# Patient Record
Sex: Female | Born: 1955
Health system: Southern US, Community
[De-identification: ages and names within clinical notes are randomized; demographics above are authoritative.]

## PROBLEM LIST (undated history)

## (undated) DIAGNOSIS — J449 Chronic obstructive pulmonary disease, unspecified: Secondary | ICD-10-CM

## (undated) HISTORY — PX: ABDOMINAL HYSTERECTOMY: SHX81

## (undated) HISTORY — PX: COLON SURGERY: SHX602

## (undated) HISTORY — PX: ANKLE ARTHROTOMY: SUR101

## (undated) HISTORY — PX: ERCP: SHX60

---

## 1997-10-26 ENCOUNTER — Ambulatory Visit (HOSPITAL_COMMUNITY): Admission: RE | Admit: 1997-10-26 | Discharge: 1997-10-26 | Payer: Self-pay | Admitting: Gastroenterology

## 1998-02-14 ENCOUNTER — Ambulatory Visit (HOSPITAL_COMMUNITY): Admission: RE | Admit: 1998-02-14 | Discharge: 1998-02-14 | Payer: Self-pay | Admitting: Orthopedic Surgery

## 1998-02-14 ENCOUNTER — Encounter: Payer: Self-pay | Admitting: Orthopedic Surgery

## 1998-02-18 ENCOUNTER — Emergency Department (HOSPITAL_COMMUNITY): Admission: EM | Admit: 1998-02-18 | Discharge: 1998-02-19 | Payer: Self-pay | Admitting: Emergency Medicine

## 1998-04-17 ENCOUNTER — Encounter: Payer: Self-pay | Admitting: Emergency Medicine

## 1998-04-17 ENCOUNTER — Emergency Department (HOSPITAL_COMMUNITY): Admission: EM | Admit: 1998-04-17 | Discharge: 1998-04-17 | Payer: Self-pay | Admitting: Emergency Medicine

## 1998-05-12 ENCOUNTER — Encounter: Payer: Self-pay | Admitting: Emergency Medicine

## 1998-05-12 ENCOUNTER — Emergency Department (HOSPITAL_COMMUNITY): Admission: EM | Admit: 1998-05-12 | Discharge: 1998-05-12 | Payer: Self-pay | Admitting: Emergency Medicine

## 1998-09-03 ENCOUNTER — Other Ambulatory Visit: Admission: RE | Admit: 1998-09-03 | Discharge: 1998-09-03 | Payer: Self-pay | Admitting: Family Medicine

## 1998-10-24 ENCOUNTER — Encounter: Payer: Self-pay | Admitting: *Deleted

## 1998-10-24 ENCOUNTER — Emergency Department (HOSPITAL_COMMUNITY): Admission: EM | Admit: 1998-10-24 | Discharge: 1998-10-24 | Payer: Self-pay | Admitting: *Deleted

## 1998-12-13 ENCOUNTER — Emergency Department (HOSPITAL_COMMUNITY): Admission: EM | Admit: 1998-12-13 | Discharge: 1998-12-14 | Payer: Self-pay | Admitting: Emergency Medicine

## 1998-12-13 ENCOUNTER — Encounter: Payer: Self-pay | Admitting: Emergency Medicine

## 1998-12-15 ENCOUNTER — Encounter: Payer: Self-pay | Admitting: Emergency Medicine

## 1998-12-15 ENCOUNTER — Emergency Department (HOSPITAL_COMMUNITY): Admission: EM | Admit: 1998-12-15 | Discharge: 1998-12-15 | Payer: Self-pay | Admitting: Emergency Medicine

## 1999-06-23 ENCOUNTER — Ambulatory Visit (HOSPITAL_COMMUNITY): Admission: RE | Admit: 1999-06-23 | Discharge: 1999-06-23 | Payer: Self-pay | Admitting: *Deleted

## 1999-11-12 ENCOUNTER — Encounter: Payer: Self-pay | Admitting: Critical Care Medicine

## 1999-11-12 ENCOUNTER — Ambulatory Visit (HOSPITAL_COMMUNITY): Admission: RE | Admit: 1999-11-12 | Discharge: 1999-11-12 | Payer: Self-pay | Admitting: Critical Care Medicine

## 2000-03-10 ENCOUNTER — Observation Stay (HOSPITAL_COMMUNITY): Admission: EM | Admit: 2000-03-10 | Discharge: 2000-03-12 | Payer: Self-pay | Admitting: Emergency Medicine

## 2000-03-10 ENCOUNTER — Encounter: Payer: Self-pay | Admitting: Internal Medicine

## 2000-04-07 ENCOUNTER — Ambulatory Visit (HOSPITAL_BASED_OUTPATIENT_CLINIC_OR_DEPARTMENT_OTHER): Admission: RE | Admit: 2000-04-07 | Discharge: 2000-04-07 | Payer: Self-pay | Admitting: Family Medicine

## 2000-09-17 ENCOUNTER — Encounter: Payer: Self-pay | Admitting: Family Medicine

## 2000-09-17 ENCOUNTER — Encounter: Admission: RE | Admit: 2000-09-17 | Discharge: 2000-09-17 | Payer: Self-pay | Admitting: Family Medicine

## 2000-11-02 ENCOUNTER — Encounter: Payer: Self-pay | Admitting: Emergency Medicine

## 2000-11-02 ENCOUNTER — Emergency Department (HOSPITAL_COMMUNITY): Admission: EM | Admit: 2000-11-02 | Discharge: 2000-11-03 | Payer: Self-pay | Admitting: Emergency Medicine

## 2001-10-06 ENCOUNTER — Emergency Department (HOSPITAL_COMMUNITY): Admission: EM | Admit: 2001-10-06 | Discharge: 2001-10-07 | Payer: Self-pay | Admitting: Emergency Medicine

## 2001-10-06 ENCOUNTER — Encounter: Payer: Self-pay | Admitting: Emergency Medicine

## 2002-05-22 ENCOUNTER — Encounter: Payer: Self-pay | Admitting: Family Medicine

## 2002-05-22 ENCOUNTER — Encounter: Admission: RE | Admit: 2002-05-22 | Discharge: 2002-05-22 | Payer: Self-pay | Admitting: Family Medicine

## 2002-07-31 ENCOUNTER — Encounter: Admission: RE | Admit: 2002-07-31 | Discharge: 2002-07-31 | Payer: Self-pay | Admitting: Orthopedic Surgery

## 2002-07-31 ENCOUNTER — Encounter: Payer: Self-pay | Admitting: Orthopedic Surgery

## 2002-08-31 ENCOUNTER — Other Ambulatory Visit: Admission: RE | Admit: 2002-08-31 | Discharge: 2002-08-31 | Payer: Self-pay | Admitting: *Deleted

## 2002-09-22 ENCOUNTER — Emergency Department (HOSPITAL_COMMUNITY): Admission: EM | Admit: 2002-09-22 | Discharge: 2002-09-22 | Payer: Self-pay

## 2002-11-03 ENCOUNTER — Observation Stay (HOSPITAL_COMMUNITY): Admission: RE | Admit: 2002-11-03 | Discharge: 2002-11-04 | Payer: Self-pay | Admitting: Obstetrics and Gynecology

## 2003-03-05 ENCOUNTER — Ambulatory Visit (HOSPITAL_COMMUNITY): Admission: RE | Admit: 2003-03-05 | Discharge: 2003-03-05 | Payer: Self-pay | Admitting: Gastroenterology

## 2003-05-01 ENCOUNTER — Ambulatory Visit (HOSPITAL_BASED_OUTPATIENT_CLINIC_OR_DEPARTMENT_OTHER): Admission: RE | Admit: 2003-05-01 | Discharge: 2003-05-01 | Payer: Self-pay | Admitting: Family Medicine

## 2004-01-01 ENCOUNTER — Ambulatory Visit: Payer: Self-pay | Admitting: Family Medicine

## 2004-01-03 ENCOUNTER — Ambulatory Visit: Payer: Self-pay | Admitting: Family Medicine

## 2004-03-17 ENCOUNTER — Emergency Department: Payer: Self-pay | Admitting: Emergency Medicine

## 2004-03-18 ENCOUNTER — Ambulatory Visit: Payer: Self-pay | Admitting: Family Medicine

## 2004-05-03 ENCOUNTER — Emergency Department (HOSPITAL_COMMUNITY): Admission: EM | Admit: 2004-05-03 | Discharge: 2004-05-04 | Payer: Self-pay | Admitting: Emergency Medicine

## 2004-05-14 ENCOUNTER — Ambulatory Visit: Payer: Self-pay | Admitting: Family Medicine

## 2004-05-15 ENCOUNTER — Ambulatory Visit: Payer: Self-pay | Admitting: Family Medicine

## 2004-05-23 ENCOUNTER — Ambulatory Visit: Payer: Self-pay | Admitting: Internal Medicine

## 2004-05-26 ENCOUNTER — Ambulatory Visit: Payer: Self-pay | Admitting: *Deleted

## 2004-06-23 ENCOUNTER — Ambulatory Visit: Payer: Self-pay | Admitting: Internal Medicine

## 2004-10-23 ENCOUNTER — Ambulatory Visit: Payer: Self-pay | Admitting: Family Medicine

## 2004-10-31 ENCOUNTER — Ambulatory Visit: Payer: Self-pay | Admitting: Internal Medicine

## 2005-07-09 ENCOUNTER — Ambulatory Visit: Payer: Self-pay | Admitting: Internal Medicine

## 2005-08-13 ENCOUNTER — Ambulatory Visit (HOSPITAL_COMMUNITY): Admission: RE | Admit: 2005-08-13 | Discharge: 2005-08-13 | Payer: Self-pay | Admitting: Family Medicine

## 2005-08-13 ENCOUNTER — Ambulatory Visit: Payer: Self-pay | Admitting: Internal Medicine

## 2005-08-20 ENCOUNTER — Ambulatory Visit (HOSPITAL_COMMUNITY): Admission: RE | Admit: 2005-08-20 | Discharge: 2005-08-20 | Payer: Self-pay | Admitting: Internal Medicine

## 2005-08-20 ENCOUNTER — Ambulatory Visit: Payer: Self-pay | Admitting: Internal Medicine

## 2005-08-24 ENCOUNTER — Ambulatory Visit: Payer: Self-pay | Admitting: Internal Medicine

## 2005-09-03 ENCOUNTER — Ambulatory Visit: Payer: Self-pay | Admitting: Internal Medicine

## 2005-10-27 ENCOUNTER — Ambulatory Visit: Payer: Self-pay | Admitting: Internal Medicine

## 2006-07-01 DIAGNOSIS — F3160 Bipolar disorder, current episode mixed, unspecified: Secondary | ICD-10-CM

## 2006-07-01 DIAGNOSIS — G43809 Other migraine, not intractable, without status migrainosus: Secondary | ICD-10-CM

## 2006-07-01 DIAGNOSIS — F17200 Nicotine dependence, unspecified, uncomplicated: Secondary | ICD-10-CM | POA: Insufficient documentation

## 2006-07-01 DIAGNOSIS — I10 Essential (primary) hypertension: Secondary | ICD-10-CM | POA: Insufficient documentation

## 2006-07-01 DIAGNOSIS — IMO0001 Reserved for inherently not codable concepts without codable children: Secondary | ICD-10-CM

## 2006-07-01 DIAGNOSIS — J45909 Unspecified asthma, uncomplicated: Secondary | ICD-10-CM | POA: Insufficient documentation

## 2006-07-01 HISTORY — DX: Unspecified asthma, uncomplicated: J45.909

## 2006-07-01 HISTORY — DX: Essential (primary) hypertension: I10

## 2006-07-01 HISTORY — DX: Bipolar disorder, current episode mixed, unspecified: F31.60

## 2006-07-01 HISTORY — DX: Reserved for inherently not codable concepts without codable children: IMO0001

## 2006-07-01 HISTORY — DX: Other migraine, not intractable, without status migrainosus: G43.809

## 2008-01-06 HISTORY — PX: ABDOMINAL SURGERY: SHX537

## 2010-01-05 HISTORY — PX: HERNIA REPAIR: SHX51

## 2010-01-05 HISTORY — PX: ABDOMINAL SURGERY: SHX537

## 2011-04-06 HISTORY — PX: ABDOMINAL SURGERY: SHX537

## 2011-10-12 DIAGNOSIS — R110 Nausea: Secondary | ICD-10-CM | POA: Insufficient documentation

## 2011-10-12 HISTORY — DX: Nausea: R11.0

## 2011-12-01 DIAGNOSIS — I447 Left bundle-branch block, unspecified: Secondary | ICD-10-CM | POA: Insufficient documentation

## 2011-12-01 DIAGNOSIS — K219 Gastro-esophageal reflux disease without esophagitis: Secondary | ICD-10-CM

## 2011-12-01 HISTORY — DX: Left bundle-branch block, unspecified: I44.7

## 2011-12-01 HISTORY — DX: Gastro-esophageal reflux disease without esophagitis: K21.9

## 2011-12-14 DIAGNOSIS — F4310 Post-traumatic stress disorder, unspecified: Secondary | ICD-10-CM | POA: Insufficient documentation

## 2011-12-14 HISTORY — DX: Post-traumatic stress disorder, unspecified: F43.10

## 2012-03-03 DIAGNOSIS — L089 Local infection of the skin and subcutaneous tissue, unspecified: Secondary | ICD-10-CM | POA: Insufficient documentation

## 2012-03-03 HISTORY — DX: Local infection of the skin and subcutaneous tissue, unspecified: L08.9

## 2012-07-26 DIAGNOSIS — F1190 Opioid use, unspecified, uncomplicated: Secondary | ICD-10-CM

## 2012-07-26 DIAGNOSIS — G894 Chronic pain syndrome: Secondary | ICD-10-CM | POA: Insufficient documentation

## 2012-07-26 HISTORY — DX: Opioid use, unspecified, uncomplicated: F11.90

## 2012-07-26 HISTORY — DX: Chronic pain syndrome: G89.4

## 2012-07-28 DIAGNOSIS — F319 Bipolar disorder, unspecified: Secondary | ICD-10-CM

## 2012-07-28 HISTORY — DX: Bipolar disorder, unspecified: F31.9

## 2012-10-24 DIAGNOSIS — R52 Pain, unspecified: Secondary | ICD-10-CM | POA: Insufficient documentation

## 2012-10-24 HISTORY — DX: Pain, unspecified: R52

## 2013-04-26 DIAGNOSIS — I671 Cerebral aneurysm, nonruptured: Secondary | ICD-10-CM

## 2013-04-26 DIAGNOSIS — Z8679 Personal history of other diseases of the circulatory system: Secondary | ICD-10-CM

## 2013-04-26 HISTORY — DX: Cerebral aneurysm, nonruptured: I67.1

## 2013-04-26 HISTORY — DX: Personal history of other diseases of the circulatory system: Z86.79

## 2013-06-29 DIAGNOSIS — W19XXXA Unspecified fall, initial encounter: Secondary | ICD-10-CM | POA: Insufficient documentation

## 2013-08-15 DIAGNOSIS — R609 Edema, unspecified: Secondary | ICD-10-CM

## 2013-08-15 DIAGNOSIS — R600 Localized edema: Secondary | ICD-10-CM

## 2013-08-15 HISTORY — DX: Localized edema: R60.0

## 2013-08-15 HISTORY — DX: Edema, unspecified: R60.9

## 2013-12-08 DIAGNOSIS — K439 Ventral hernia without obstruction or gangrene: Secondary | ICD-10-CM

## 2013-12-08 DIAGNOSIS — K861 Other chronic pancreatitis: Secondary | ICD-10-CM

## 2013-12-08 HISTORY — DX: Ventral hernia without obstruction or gangrene: K43.9

## 2013-12-08 HISTORY — DX: Other chronic pancreatitis: K86.1

## 2015-11-29 DIAGNOSIS — Z6841 Body Mass Index (BMI) 40.0 and over, adult: Secondary | ICD-10-CM | POA: Insufficient documentation

## 2015-11-29 HISTORY — DX: Morbid (severe) obesity due to excess calories: E66.01

## 2015-11-29 HISTORY — DX: Body Mass Index (BMI) 40.0 and over, adult: Z684

## 2017-03-18 DIAGNOSIS — Z9989 Dependence on other enabling machines and devices: Secondary | ICD-10-CM

## 2017-03-18 DIAGNOSIS — G4726 Circadian rhythm sleep disorder, shift work type: Secondary | ICD-10-CM

## 2017-03-18 DIAGNOSIS — G4733 Obstructive sleep apnea (adult) (pediatric): Secondary | ICD-10-CM

## 2017-03-18 HISTORY — DX: Dependence on other enabling machines and devices: Z99.89

## 2017-03-18 HISTORY — DX: Circadian rhythm sleep disorder, shift work type: G47.26

## 2017-03-18 HISTORY — DX: Obstructive sleep apnea (adult) (pediatric): G47.33

## 2017-06-02 DIAGNOSIS — E1142 Type 2 diabetes mellitus with diabetic polyneuropathy: Secondary | ICD-10-CM

## 2017-06-02 DIAGNOSIS — I509 Heart failure, unspecified: Secondary | ICD-10-CM

## 2017-06-02 DIAGNOSIS — N183 Chronic kidney disease, stage 3 unspecified: Secondary | ICD-10-CM | POA: Insufficient documentation

## 2017-06-02 HISTORY — DX: Type 2 diabetes mellitus with diabetic polyneuropathy: E11.42

## 2017-06-02 HISTORY — DX: Heart failure, unspecified: I50.9

## 2017-06-02 HISTORY — DX: Chronic kidney disease, stage 3 unspecified: N18.30

## 2017-08-25 DIAGNOSIS — E119 Type 2 diabetes mellitus without complications: Secondary | ICD-10-CM | POA: Diagnosis not present

## 2017-09-01 DIAGNOSIS — Z794 Long term (current) use of insulin: Secondary | ICD-10-CM | POA: Diagnosis not present

## 2017-09-01 DIAGNOSIS — E782 Mixed hyperlipidemia: Secondary | ICD-10-CM | POA: Diagnosis not present

## 2017-09-01 DIAGNOSIS — Z760 Encounter for issue of repeat prescription: Secondary | ICD-10-CM | POA: Diagnosis not present

## 2017-09-01 DIAGNOSIS — N183 Chronic kidney disease, stage 3 (moderate): Secondary | ICD-10-CM | POA: Diagnosis not present

## 2017-09-01 DIAGNOSIS — E1142 Type 2 diabetes mellitus with diabetic polyneuropathy: Secondary | ICD-10-CM | POA: Diagnosis not present

## 2017-09-01 DIAGNOSIS — I509 Heart failure, unspecified: Secondary | ICD-10-CM | POA: Diagnosis not present

## 2017-10-02 DIAGNOSIS — I509 Heart failure, unspecified: Secondary | ICD-10-CM | POA: Diagnosis not present

## 2017-10-15 DIAGNOSIS — E119 Type 2 diabetes mellitus without complications: Secondary | ICD-10-CM | POA: Diagnosis not present

## 2017-10-15 DIAGNOSIS — Z23 Encounter for immunization: Secondary | ICD-10-CM | POA: Diagnosis not present

## 2017-10-22 DIAGNOSIS — R109 Unspecified abdominal pain: Secondary | ICD-10-CM | POA: Diagnosis not present

## 2017-10-22 DIAGNOSIS — G8929 Other chronic pain: Secondary | ICD-10-CM | POA: Diagnosis not present

## 2017-11-01 DIAGNOSIS — I509 Heart failure, unspecified: Secondary | ICD-10-CM | POA: Diagnosis not present

## 2017-12-02 DIAGNOSIS — I509 Heart failure, unspecified: Secondary | ICD-10-CM | POA: Diagnosis not present

## 2017-12-03 DIAGNOSIS — E119 Type 2 diabetes mellitus without complications: Secondary | ICD-10-CM | POA: Diagnosis not present

## 2017-12-21 DIAGNOSIS — F419 Anxiety disorder, unspecified: Secondary | ICD-10-CM

## 2017-12-21 DIAGNOSIS — K5904 Chronic idiopathic constipation: Secondary | ICD-10-CM

## 2017-12-21 DIAGNOSIS — I509 Heart failure, unspecified: Secondary | ICD-10-CM | POA: Diagnosis not present

## 2017-12-21 DIAGNOSIS — E7841 Elevated Lipoprotein(a): Secondary | ICD-10-CM | POA: Diagnosis not present

## 2017-12-21 DIAGNOSIS — Z794 Long term (current) use of insulin: Secondary | ICD-10-CM | POA: Diagnosis not present

## 2017-12-21 DIAGNOSIS — K219 Gastro-esophageal reflux disease without esophagitis: Secondary | ICD-10-CM | POA: Diagnosis not present

## 2017-12-21 DIAGNOSIS — F5101 Primary insomnia: Secondary | ICD-10-CM

## 2017-12-21 DIAGNOSIS — Z1239 Encounter for other screening for malignant neoplasm of breast: Secondary | ICD-10-CM | POA: Insufficient documentation

## 2017-12-21 DIAGNOSIS — E1142 Type 2 diabetes mellitus with diabetic polyneuropathy: Secondary | ICD-10-CM | POA: Diagnosis not present

## 2017-12-21 DIAGNOSIS — I10 Essential (primary) hypertension: Secondary | ICD-10-CM | POA: Diagnosis not present

## 2017-12-21 HISTORY — DX: Anxiety disorder, unspecified: F41.9

## 2017-12-21 HISTORY — DX: Primary insomnia: F51.01

## 2017-12-21 HISTORY — DX: Chronic idiopathic constipation: K59.04

## 2017-12-21 HISTORY — DX: Elevated lipoprotein(a): E78.41

## 2017-12-24 DIAGNOSIS — M25561 Pain in right knee: Secondary | ICD-10-CM | POA: Diagnosis not present

## 2018-01-01 DIAGNOSIS — I509 Heart failure, unspecified: Secondary | ICD-10-CM | POA: Diagnosis not present

## 2018-01-11 DIAGNOSIS — Z1239 Encounter for other screening for malignant neoplasm of breast: Secondary | ICD-10-CM | POA: Diagnosis not present

## 2018-01-11 DIAGNOSIS — Z87891 Personal history of nicotine dependence: Secondary | ICD-10-CM | POA: Diagnosis not present

## 2018-01-11 DIAGNOSIS — Z803 Family history of malignant neoplasm of breast: Secondary | ICD-10-CM | POA: Diagnosis not present

## 2018-01-11 DIAGNOSIS — Z1231 Encounter for screening mammogram for malignant neoplasm of breast: Secondary | ICD-10-CM | POA: Diagnosis not present

## 2018-01-11 DIAGNOSIS — Z5320 Procedure and treatment not carried out because of patient's decision for unspecified reasons: Secondary | ICD-10-CM | POA: Diagnosis not present

## 2018-01-17 DIAGNOSIS — R928 Other abnormal and inconclusive findings on diagnostic imaging of breast: Secondary | ICD-10-CM | POA: Diagnosis not present

## 2018-01-21 DIAGNOSIS — Z1212 Encounter for screening for malignant neoplasm of rectum: Secondary | ICD-10-CM | POA: Diagnosis not present

## 2018-01-21 DIAGNOSIS — Z1211 Encounter for screening for malignant neoplasm of colon: Secondary | ICD-10-CM | POA: Diagnosis not present

## 2018-01-26 DIAGNOSIS — F330 Major depressive disorder, recurrent, mild: Secondary | ICD-10-CM | POA: Diagnosis not present

## 2018-01-26 DIAGNOSIS — I872 Venous insufficiency (chronic) (peripheral): Secondary | ICD-10-CM | POA: Insufficient documentation

## 2018-01-26 DIAGNOSIS — F4312 Post-traumatic stress disorder, chronic: Secondary | ICD-10-CM | POA: Diagnosis not present

## 2018-01-26 DIAGNOSIS — K529 Noninfective gastroenteritis and colitis, unspecified: Secondary | ICD-10-CM | POA: Insufficient documentation

## 2018-01-26 DIAGNOSIS — M1990 Unspecified osteoarthritis, unspecified site: Secondary | ICD-10-CM | POA: Insufficient documentation

## 2018-01-26 DIAGNOSIS — I5022 Chronic systolic (congestive) heart failure: Secondary | ICD-10-CM | POA: Insufficient documentation

## 2018-01-26 DIAGNOSIS — J42 Unspecified chronic bronchitis: Secondary | ICD-10-CM | POA: Insufficient documentation

## 2018-01-26 HISTORY — DX: Unspecified chronic bronchitis: J42

## 2018-01-26 HISTORY — DX: Chronic systolic (congestive) heart failure: I50.22

## 2018-01-26 HISTORY — DX: Noninfective gastroenteritis and colitis, unspecified: K52.9

## 2018-01-26 HISTORY — DX: Unspecified osteoarthritis, unspecified site: M19.90

## 2018-01-26 HISTORY — DX: Venous insufficiency (chronic) (peripheral): I87.2

## 2018-02-01 DIAGNOSIS — I509 Heart failure, unspecified: Secondary | ICD-10-CM | POA: Diagnosis not present

## 2018-02-04 DIAGNOSIS — E1151 Type 2 diabetes mellitus with diabetic peripheral angiopathy without gangrene: Secondary | ICD-10-CM | POA: Diagnosis not present

## 2018-02-04 DIAGNOSIS — Z87891 Personal history of nicotine dependence: Secondary | ICD-10-CM | POA: Diagnosis not present

## 2018-02-04 DIAGNOSIS — M542 Cervicalgia: Secondary | ICD-10-CM | POA: Diagnosis not present

## 2018-02-04 DIAGNOSIS — I110 Hypertensive heart disease with heart failure: Secondary | ICD-10-CM | POA: Diagnosis not present

## 2018-02-04 DIAGNOSIS — J449 Chronic obstructive pulmonary disease, unspecified: Secondary | ICD-10-CM | POA: Diagnosis not present

## 2018-02-04 DIAGNOSIS — M79631 Pain in right forearm: Secondary | ICD-10-CM | POA: Diagnosis not present

## 2018-02-04 DIAGNOSIS — S5011XA Contusion of right forearm, initial encounter: Secondary | ICD-10-CM | POA: Diagnosis not present

## 2018-02-04 DIAGNOSIS — I509 Heart failure, unspecified: Secondary | ICD-10-CM | POA: Diagnosis not present

## 2018-02-15 DIAGNOSIS — Z794 Long term (current) use of insulin: Secondary | ICD-10-CM | POA: Diagnosis not present

## 2018-02-15 DIAGNOSIS — E1142 Type 2 diabetes mellitus with diabetic polyneuropathy: Secondary | ICD-10-CM | POA: Diagnosis not present

## 2018-02-15 DIAGNOSIS — G894 Chronic pain syndrome: Secondary | ICD-10-CM | POA: Diagnosis not present

## 2018-02-15 DIAGNOSIS — F419 Anxiety disorder, unspecified: Secondary | ICD-10-CM | POA: Diagnosis not present

## 2018-02-18 DIAGNOSIS — I509 Heart failure, unspecified: Secondary | ICD-10-CM | POA: Diagnosis not present

## 2018-02-18 DIAGNOSIS — R531 Weakness: Secondary | ICD-10-CM | POA: Diagnosis not present

## 2018-02-18 DIAGNOSIS — Z79899 Other long term (current) drug therapy: Secondary | ICD-10-CM | POA: Diagnosis not present

## 2018-02-18 DIAGNOSIS — Z87891 Personal history of nicotine dependence: Secondary | ICD-10-CM | POA: Diagnosis not present

## 2018-02-18 DIAGNOSIS — R404 Transient alteration of awareness: Secondary | ICD-10-CM | POA: Diagnosis not present

## 2018-02-18 DIAGNOSIS — F319 Bipolar disorder, unspecified: Secondary | ICD-10-CM | POA: Diagnosis not present

## 2018-02-18 DIAGNOSIS — J449 Chronic obstructive pulmonary disease, unspecified: Secondary | ICD-10-CM | POA: Diagnosis not present

## 2018-02-18 DIAGNOSIS — Z794 Long term (current) use of insulin: Secondary | ICD-10-CM | POA: Diagnosis not present

## 2018-02-18 DIAGNOSIS — E162 Hypoglycemia, unspecified: Secondary | ICD-10-CM | POA: Diagnosis not present

## 2018-02-18 DIAGNOSIS — I110 Hypertensive heart disease with heart failure: Secondary | ICD-10-CM | POA: Diagnosis not present

## 2018-02-18 DIAGNOSIS — E161 Other hypoglycemia: Secondary | ICD-10-CM | POA: Diagnosis not present

## 2018-02-18 DIAGNOSIS — R479 Unspecified speech disturbances: Secondary | ICD-10-CM | POA: Diagnosis not present

## 2018-02-18 DIAGNOSIS — E1165 Type 2 diabetes mellitus with hyperglycemia: Secondary | ICD-10-CM | POA: Diagnosis not present

## 2018-02-18 DIAGNOSIS — I447 Left bundle-branch block, unspecified: Secondary | ICD-10-CM | POA: Diagnosis not present

## 2018-02-23 DIAGNOSIS — F331 Major depressive disorder, recurrent, moderate: Secondary | ICD-10-CM | POA: Insufficient documentation

## 2018-02-23 DIAGNOSIS — G4733 Obstructive sleep apnea (adult) (pediatric): Secondary | ICD-10-CM | POA: Diagnosis not present

## 2018-02-23 DIAGNOSIS — I480 Paroxysmal atrial fibrillation: Secondary | ICD-10-CM | POA: Diagnosis not present

## 2018-02-23 DIAGNOSIS — R002 Palpitations: Secondary | ICD-10-CM | POA: Diagnosis not present

## 2018-02-23 DIAGNOSIS — R0600 Dyspnea, unspecified: Secondary | ICD-10-CM | POA: Diagnosis not present

## 2018-02-23 HISTORY — DX: Major depressive disorder, recurrent, moderate: F33.1

## 2018-03-04 DIAGNOSIS — I509 Heart failure, unspecified: Secondary | ICD-10-CM | POA: Diagnosis not present

## 2018-03-22 DIAGNOSIS — E1165 Type 2 diabetes mellitus with hyperglycemia: Secondary | ICD-10-CM | POA: Diagnosis not present

## 2018-03-22 DIAGNOSIS — T300 Burn of unspecified body region, unspecified degree: Secondary | ICD-10-CM

## 2018-03-22 DIAGNOSIS — G894 Chronic pain syndrome: Secondary | ICD-10-CM | POA: Diagnosis not present

## 2018-03-22 DIAGNOSIS — E7841 Elevated Lipoprotein(a): Secondary | ICD-10-CM | POA: Diagnosis not present

## 2018-03-22 HISTORY — DX: Burn of unspecified body region, unspecified degree: T30.0

## 2018-03-31 DIAGNOSIS — R002 Palpitations: Secondary | ICD-10-CM | POA: Diagnosis not present

## 2018-04-02 DIAGNOSIS — I509 Heart failure, unspecified: Secondary | ICD-10-CM | POA: Diagnosis not present

## 2018-04-06 DIAGNOSIS — F4312 Post-traumatic stress disorder, chronic: Secondary | ICD-10-CM | POA: Diagnosis not present

## 2018-04-06 DIAGNOSIS — F3341 Major depressive disorder, recurrent, in partial remission: Secondary | ICD-10-CM | POA: Diagnosis not present

## 2018-04-14 DIAGNOSIS — R002 Palpitations: Secondary | ICD-10-CM | POA: Diagnosis not present

## 2018-04-22 DIAGNOSIS — R002 Palpitations: Secondary | ICD-10-CM | POA: Diagnosis not present

## 2018-04-27 DIAGNOSIS — R0600 Dyspnea, unspecified: Secondary | ICD-10-CM | POA: Diagnosis not present

## 2018-04-27 DIAGNOSIS — R002 Palpitations: Secondary | ICD-10-CM | POA: Diagnosis not present

## 2018-05-03 DIAGNOSIS — I509 Heart failure, unspecified: Secondary | ICD-10-CM | POA: Diagnosis not present

## 2018-05-04 DIAGNOSIS — R0600 Dyspnea, unspecified: Secondary | ICD-10-CM | POA: Diagnosis not present

## 2018-05-04 DIAGNOSIS — R002 Palpitations: Secondary | ICD-10-CM | POA: Diagnosis not present

## 2018-05-18 DIAGNOSIS — F411 Generalized anxiety disorder: Secondary | ICD-10-CM | POA: Diagnosis not present

## 2018-05-18 DIAGNOSIS — F4312 Post-traumatic stress disorder, chronic: Secondary | ICD-10-CM | POA: Diagnosis not present

## 2018-05-18 DIAGNOSIS — F331 Major depressive disorder, recurrent, moderate: Secondary | ICD-10-CM | POA: Diagnosis not present

## 2018-07-20 DIAGNOSIS — F331 Major depressive disorder, recurrent, moderate: Secondary | ICD-10-CM | POA: Diagnosis not present

## 2018-07-20 DIAGNOSIS — F411 Generalized anxiety disorder: Secondary | ICD-10-CM | POA: Diagnosis not present

## 2018-07-20 DIAGNOSIS — F4312 Post-traumatic stress disorder, chronic: Secondary | ICD-10-CM | POA: Diagnosis not present

## 2018-08-16 DIAGNOSIS — G894 Chronic pain syndrome: Secondary | ICD-10-CM | POA: Diagnosis not present

## 2018-08-16 DIAGNOSIS — Z760 Encounter for issue of repeat prescription: Secondary | ICD-10-CM | POA: Diagnosis not present

## 2018-08-16 DIAGNOSIS — E7841 Elevated Lipoprotein(a): Secondary | ICD-10-CM | POA: Diagnosis not present

## 2018-08-16 DIAGNOSIS — E1142 Type 2 diabetes mellitus with diabetic polyneuropathy: Secondary | ICD-10-CM | POA: Diagnosis not present

## 2018-08-19 DIAGNOSIS — R079 Chest pain, unspecified: Secondary | ICD-10-CM | POA: Diagnosis not present

## 2018-08-19 DIAGNOSIS — I447 Left bundle-branch block, unspecified: Secondary | ICD-10-CM | POA: Diagnosis not present

## 2018-08-19 DIAGNOSIS — Z87891 Personal history of nicotine dependence: Secondary | ICD-10-CM | POA: Diagnosis not present

## 2018-08-19 DIAGNOSIS — M1990 Unspecified osteoarthritis, unspecified site: Secondary | ICD-10-CM | POA: Diagnosis not present

## 2018-08-19 DIAGNOSIS — I509 Heart failure, unspecified: Secondary | ICD-10-CM | POA: Diagnosis not present

## 2018-08-19 DIAGNOSIS — F4320 Adjustment disorder, unspecified: Secondary | ICD-10-CM | POA: Diagnosis not present

## 2018-08-19 DIAGNOSIS — Z9889 Other specified postprocedural states: Secondary | ICD-10-CM | POA: Diagnosis not present

## 2018-08-19 DIAGNOSIS — I110 Hypertensive heart disease with heart failure: Secondary | ICD-10-CM | POA: Diagnosis not present

## 2018-08-19 DIAGNOSIS — E1151 Type 2 diabetes mellitus with diabetic peripheral angiopathy without gangrene: Secondary | ICD-10-CM | POA: Diagnosis not present

## 2018-08-19 DIAGNOSIS — Z794 Long term (current) use of insulin: Secondary | ICD-10-CM | POA: Diagnosis not present

## 2018-08-19 DIAGNOSIS — Z7401 Bed confinement status: Secondary | ICD-10-CM | POA: Diagnosis not present

## 2018-08-19 DIAGNOSIS — M2550 Pain in unspecified joint: Secondary | ICD-10-CM | POA: Diagnosis not present

## 2018-08-19 DIAGNOSIS — J449 Chronic obstructive pulmonary disease, unspecified: Secondary | ICD-10-CM | POA: Diagnosis not present

## 2018-08-19 DIAGNOSIS — E161 Other hypoglycemia: Secondary | ICD-10-CM | POA: Diagnosis not present

## 2018-08-19 DIAGNOSIS — E162 Hypoglycemia, unspecified: Secondary | ICD-10-CM | POA: Diagnosis not present

## 2018-08-19 DIAGNOSIS — Z79899 Other long term (current) drug therapy: Secondary | ICD-10-CM | POA: Diagnosis not present

## 2018-09-02 DIAGNOSIS — I509 Heart failure, unspecified: Secondary | ICD-10-CM | POA: Diagnosis not present

## 2018-09-06 DIAGNOSIS — Z794 Long term (current) use of insulin: Secondary | ICD-10-CM | POA: Diagnosis not present

## 2018-09-06 DIAGNOSIS — S93401A Sprain of unspecified ligament of right ankle, initial encounter: Secondary | ICD-10-CM

## 2018-09-06 DIAGNOSIS — I10 Essential (primary) hypertension: Secondary | ICD-10-CM | POA: Diagnosis not present

## 2018-09-06 DIAGNOSIS — E1165 Type 2 diabetes mellitus with hyperglycemia: Secondary | ICD-10-CM | POA: Diagnosis not present

## 2018-09-06 DIAGNOSIS — R0789 Other chest pain: Secondary | ICD-10-CM

## 2018-09-06 HISTORY — DX: Sprain of unspecified ligament of right ankle, initial encounter: S93.401A

## 2018-09-06 HISTORY — DX: Other chest pain: R07.89

## 2018-09-07 DIAGNOSIS — F411 Generalized anxiety disorder: Secondary | ICD-10-CM | POA: Diagnosis not present

## 2018-09-07 DIAGNOSIS — F4312 Post-traumatic stress disorder, chronic: Secondary | ICD-10-CM | POA: Diagnosis not present

## 2018-09-07 DIAGNOSIS — F331 Major depressive disorder, recurrent, moderate: Secondary | ICD-10-CM | POA: Diagnosis not present

## 2018-09-09 DIAGNOSIS — Z743 Need for continuous supervision: Secondary | ICD-10-CM | POA: Diagnosis not present

## 2018-09-09 DIAGNOSIS — T310 Burns involving less than 10% of body surface: Secondary | ICD-10-CM | POA: Diagnosis not present

## 2018-09-09 DIAGNOSIS — T23122A Burn of first degree of single left finger (nail) except thumb, initial encounter: Secondary | ICD-10-CM | POA: Diagnosis not present

## 2018-09-09 DIAGNOSIS — T300 Burn of unspecified body region, unspecified degree: Secondary | ICD-10-CM | POA: Diagnosis not present

## 2018-09-09 DIAGNOSIS — Z794 Long term (current) use of insulin: Secondary | ICD-10-CM | POA: Diagnosis not present

## 2018-09-09 DIAGNOSIS — Z9889 Other specified postprocedural states: Secondary | ICD-10-CM | POA: Diagnosis not present

## 2018-09-09 DIAGNOSIS — T24222A Burn of second degree of left knee, initial encounter: Secondary | ICD-10-CM | POA: Diagnosis not present

## 2018-09-09 DIAGNOSIS — I509 Heart failure, unspecified: Secondary | ICD-10-CM | POA: Diagnosis not present

## 2018-09-09 DIAGNOSIS — I110 Hypertensive heart disease with heart failure: Secondary | ICD-10-CM | POA: Diagnosis not present

## 2018-09-09 DIAGNOSIS — J449 Chronic obstructive pulmonary disease, unspecified: Secondary | ICD-10-CM | POA: Diagnosis not present

## 2018-09-09 DIAGNOSIS — R279 Unspecified lack of coordination: Secondary | ICD-10-CM | POA: Diagnosis not present

## 2018-09-09 DIAGNOSIS — Z87891 Personal history of nicotine dependence: Secondary | ICD-10-CM | POA: Diagnosis not present

## 2018-09-09 DIAGNOSIS — I872 Venous insufficiency (chronic) (peripheral): Secondary | ICD-10-CM | POA: Diagnosis not present

## 2018-09-09 DIAGNOSIS — T24112A Burn of first degree of left thigh, initial encounter: Secondary | ICD-10-CM | POA: Diagnosis not present

## 2018-09-09 DIAGNOSIS — F4320 Adjustment disorder, unspecified: Secondary | ICD-10-CM | POA: Diagnosis not present

## 2018-09-09 DIAGNOSIS — E119 Type 2 diabetes mellitus without complications: Secondary | ICD-10-CM | POA: Diagnosis not present

## 2018-09-09 DIAGNOSIS — Z79899 Other long term (current) drug therapy: Secondary | ICD-10-CM | POA: Diagnosis not present

## 2018-09-27 DIAGNOSIS — B354 Tinea corporis: Secondary | ICD-10-CM

## 2018-09-27 DIAGNOSIS — E1165 Type 2 diabetes mellitus with hyperglycemia: Secondary | ICD-10-CM | POA: Diagnosis not present

## 2018-09-27 DIAGNOSIS — I10 Essential (primary) hypertension: Secondary | ICD-10-CM | POA: Diagnosis not present

## 2018-09-27 DIAGNOSIS — T300 Burn of unspecified body region, unspecified degree: Secondary | ICD-10-CM | POA: Diagnosis not present

## 2018-09-27 HISTORY — DX: Tinea corporis: B35.4

## 2018-10-03 DIAGNOSIS — I509 Heart failure, unspecified: Secondary | ICD-10-CM | POA: Diagnosis not present

## 2018-10-06 DIAGNOSIS — Z9181 History of falling: Secondary | ICD-10-CM | POA: Insufficient documentation

## 2018-10-06 HISTORY — DX: History of falling: Z91.81

## 2018-10-24 DIAGNOSIS — F5101 Primary insomnia: Secondary | ICD-10-CM | POA: Diagnosis not present

## 2018-10-24 DIAGNOSIS — F411 Generalized anxiety disorder: Secondary | ICD-10-CM | POA: Diagnosis not present

## 2018-10-24 DIAGNOSIS — F331 Major depressive disorder, recurrent, moderate: Secondary | ICD-10-CM | POA: Diagnosis not present

## 2018-10-24 DIAGNOSIS — F4312 Post-traumatic stress disorder, chronic: Secondary | ICD-10-CM | POA: Diagnosis not present

## 2018-10-27 DIAGNOSIS — Z794 Long term (current) use of insulin: Secondary | ICD-10-CM | POA: Diagnosis not present

## 2018-10-27 DIAGNOSIS — E1165 Type 2 diabetes mellitus with hyperglycemia: Secondary | ICD-10-CM | POA: Diagnosis not present

## 2018-11-02 DIAGNOSIS — I509 Heart failure, unspecified: Secondary | ICD-10-CM | POA: Diagnosis not present

## 2018-11-03 DIAGNOSIS — I480 Paroxysmal atrial fibrillation: Secondary | ICD-10-CM | POA: Diagnosis not present

## 2018-11-03 DIAGNOSIS — R0600 Dyspnea, unspecified: Secondary | ICD-10-CM | POA: Diagnosis not present

## 2018-11-03 DIAGNOSIS — I872 Venous insufficiency (chronic) (peripheral): Secondary | ICD-10-CM | POA: Diagnosis not present

## 2018-12-21 DIAGNOSIS — I10 Essential (primary) hypertension: Secondary | ICD-10-CM | POA: Diagnosis not present

## 2018-12-21 DIAGNOSIS — Z794 Long term (current) use of insulin: Secondary | ICD-10-CM | POA: Diagnosis not present

## 2018-12-21 DIAGNOSIS — E1165 Type 2 diabetes mellitus with hyperglycemia: Secondary | ICD-10-CM | POA: Diagnosis not present

## 2018-12-21 DIAGNOSIS — Z1231 Encounter for screening mammogram for malignant neoplasm of breast: Secondary | ICD-10-CM | POA: Diagnosis not present

## 2019-01-02 DIAGNOSIS — I509 Heart failure, unspecified: Secondary | ICD-10-CM | POA: Diagnosis not present

## 2019-01-04 DIAGNOSIS — G4733 Obstructive sleep apnea (adult) (pediatric): Secondary | ICD-10-CM | POA: Diagnosis not present

## 2019-01-18 DIAGNOSIS — E1142 Type 2 diabetes mellitus with diabetic polyneuropathy: Secondary | ICD-10-CM | POA: Diagnosis not present

## 2019-01-18 DIAGNOSIS — J029 Acute pharyngitis, unspecified: Secondary | ICD-10-CM

## 2019-01-18 DIAGNOSIS — I10 Essential (primary) hypertension: Secondary | ICD-10-CM | POA: Diagnosis not present

## 2019-01-18 DIAGNOSIS — B950 Streptococcus, group A, as the cause of diseases classified elsewhere: Secondary | ICD-10-CM | POA: Insufficient documentation

## 2019-01-18 HISTORY — DX: Acute pharyngitis, unspecified: J02.9

## 2019-01-18 HISTORY — DX: Streptococcus, group A, as the cause of diseases classified elsewhere: B95.0

## 2019-02-02 DIAGNOSIS — I509 Heart failure, unspecified: Secondary | ICD-10-CM | POA: Diagnosis not present

## 2019-03-09 DIAGNOSIS — M25512 Pain in left shoulder: Secondary | ICD-10-CM | POA: Diagnosis not present

## 2019-03-09 DIAGNOSIS — M25522 Pain in left elbow: Secondary | ICD-10-CM | POA: Diagnosis not present

## 2019-03-09 DIAGNOSIS — F331 Major depressive disorder, recurrent, moderate: Secondary | ICD-10-CM | POA: Diagnosis not present

## 2019-03-09 DIAGNOSIS — F411 Generalized anxiety disorder: Secondary | ICD-10-CM | POA: Diagnosis not present

## 2019-03-09 DIAGNOSIS — M25561 Pain in right knee: Secondary | ICD-10-CM | POA: Diagnosis not present

## 2019-03-09 DIAGNOSIS — I509 Heart failure, unspecified: Secondary | ICD-10-CM | POA: Diagnosis not present

## 2019-03-09 DIAGNOSIS — M1990 Unspecified osteoarthritis, unspecified site: Secondary | ICD-10-CM | POA: Diagnosis not present

## 2019-03-09 DIAGNOSIS — J449 Chronic obstructive pulmonary disease, unspecified: Secondary | ICD-10-CM | POA: Diagnosis not present

## 2019-03-09 DIAGNOSIS — Z87891 Personal history of nicotine dependence: Secondary | ICD-10-CM | POA: Diagnosis not present

## 2019-03-09 DIAGNOSIS — R531 Weakness: Secondary | ICD-10-CM | POA: Diagnosis not present

## 2019-03-09 DIAGNOSIS — I447 Left bundle-branch block, unspecified: Secondary | ICD-10-CM | POA: Diagnosis not present

## 2019-03-09 DIAGNOSIS — R519 Headache, unspecified: Secondary | ICD-10-CM | POA: Diagnosis not present

## 2019-03-09 DIAGNOSIS — Z23 Encounter for immunization: Secondary | ICD-10-CM | POA: Diagnosis not present

## 2019-03-09 DIAGNOSIS — F4312 Post-traumatic stress disorder, chronic: Secondary | ICD-10-CM | POA: Diagnosis not present

## 2019-03-09 DIAGNOSIS — I4891 Unspecified atrial fibrillation: Secondary | ICD-10-CM | POA: Diagnosis not present

## 2019-03-09 DIAGNOSIS — E119 Type 2 diabetes mellitus without complications: Secondary | ICD-10-CM | POA: Diagnosis not present

## 2019-03-09 DIAGNOSIS — I2510 Atherosclerotic heart disease of native coronary artery without angina pectoris: Secondary | ICD-10-CM | POA: Diagnosis not present

## 2019-03-09 DIAGNOSIS — M25571 Pain in right ankle and joints of right foot: Secondary | ICD-10-CM | POA: Diagnosis not present

## 2019-03-09 DIAGNOSIS — I110 Hypertensive heart disease with heart failure: Secondary | ICD-10-CM | POA: Diagnosis not present

## 2019-03-09 DIAGNOSIS — S59902A Unspecified injury of left elbow, initial encounter: Secondary | ICD-10-CM | POA: Diagnosis not present

## 2019-03-09 DIAGNOSIS — Z8679 Personal history of other diseases of the circulatory system: Secondary | ICD-10-CM | POA: Diagnosis not present

## 2019-03-09 DIAGNOSIS — F5101 Primary insomnia: Secondary | ICD-10-CM | POA: Diagnosis not present

## 2019-03-09 DIAGNOSIS — M25532 Pain in left wrist: Secondary | ICD-10-CM | POA: Diagnosis not present

## 2019-03-29 DIAGNOSIS — B354 Tinea corporis: Secondary | ICD-10-CM | POA: Diagnosis not present

## 2019-03-29 DIAGNOSIS — E1142 Type 2 diabetes mellitus with diabetic polyneuropathy: Secondary | ICD-10-CM | POA: Diagnosis not present

## 2019-03-29 DIAGNOSIS — Z794 Long term (current) use of insulin: Secondary | ICD-10-CM | POA: Diagnosis not present

## 2019-04-03 DIAGNOSIS — I671 Cerebral aneurysm, nonruptured: Secondary | ICD-10-CM | POA: Diagnosis not present

## 2019-04-03 DIAGNOSIS — J42 Unspecified chronic bronchitis: Secondary | ICD-10-CM | POA: Diagnosis not present

## 2019-04-03 DIAGNOSIS — F411 Generalized anxiety disorder: Secondary | ICD-10-CM | POA: Diagnosis not present

## 2019-04-03 DIAGNOSIS — E1151 Type 2 diabetes mellitus with diabetic peripheral angiopathy without gangrene: Secondary | ICD-10-CM | POA: Diagnosis not present

## 2019-04-03 DIAGNOSIS — I130 Hypertensive heart and chronic kidney disease with heart failure and stage 1 through stage 4 chronic kidney disease, or unspecified chronic kidney disease: Secondary | ICD-10-CM | POA: Diagnosis not present

## 2019-04-03 DIAGNOSIS — G8929 Other chronic pain: Secondary | ICD-10-CM | POA: Diagnosis not present

## 2019-04-03 DIAGNOSIS — I5022 Chronic systolic (congestive) heart failure: Secondary | ICD-10-CM | POA: Diagnosis not present

## 2019-04-03 DIAGNOSIS — E1142 Type 2 diabetes mellitus with diabetic polyneuropathy: Secondary | ICD-10-CM | POA: Diagnosis not present

## 2019-04-03 DIAGNOSIS — I447 Left bundle-branch block, unspecified: Secondary | ICD-10-CM | POA: Diagnosis not present

## 2019-04-03 DIAGNOSIS — F4310 Post-traumatic stress disorder, unspecified: Secondary | ICD-10-CM | POA: Diagnosis not present

## 2019-04-03 DIAGNOSIS — F4320 Adjustment disorder, unspecified: Secondary | ICD-10-CM | POA: Diagnosis not present

## 2019-04-03 DIAGNOSIS — G894 Chronic pain syndrome: Secondary | ICD-10-CM | POA: Diagnosis not present

## 2019-04-03 DIAGNOSIS — N183 Chronic kidney disease, stage 3 unspecified: Secondary | ICD-10-CM | POA: Diagnosis not present

## 2019-04-03 DIAGNOSIS — E1122 Type 2 diabetes mellitus with diabetic chronic kidney disease: Secondary | ICD-10-CM | POA: Diagnosis not present

## 2019-04-03 DIAGNOSIS — S93401D Sprain of unspecified ligament of right ankle, subsequent encounter: Secondary | ICD-10-CM | POA: Diagnosis not present

## 2019-04-03 DIAGNOSIS — M1990 Unspecified osteoarthritis, unspecified site: Secondary | ICD-10-CM | POA: Diagnosis not present

## 2019-04-09 DIAGNOSIS — E162 Hypoglycemia, unspecified: Secondary | ICD-10-CM | POA: Diagnosis not present

## 2019-04-09 DIAGNOSIS — E119 Type 2 diabetes mellitus without complications: Secondary | ICD-10-CM | POA: Diagnosis not present

## 2019-04-09 DIAGNOSIS — Z79899 Other long term (current) drug therapy: Secondary | ICD-10-CM | POA: Diagnosis not present

## 2019-04-09 DIAGNOSIS — I509 Heart failure, unspecified: Secondary | ICD-10-CM | POA: Diagnosis not present

## 2019-04-09 DIAGNOSIS — Z87891 Personal history of nicotine dependence: Secondary | ICD-10-CM | POA: Diagnosis not present

## 2019-04-09 DIAGNOSIS — Z9889 Other specified postprocedural states: Secondary | ICD-10-CM | POA: Diagnosis not present

## 2019-04-09 DIAGNOSIS — I110 Hypertensive heart disease with heart failure: Secondary | ICD-10-CM | POA: Diagnosis not present

## 2019-04-09 DIAGNOSIS — R52 Pain, unspecified: Secondary | ICD-10-CM | POA: Diagnosis not present

## 2019-04-09 DIAGNOSIS — J449 Chronic obstructive pulmonary disease, unspecified: Secondary | ICD-10-CM | POA: Diagnosis not present

## 2019-04-09 DIAGNOSIS — S40022A Contusion of left upper arm, initial encounter: Secondary | ICD-10-CM | POA: Diagnosis not present

## 2019-04-09 DIAGNOSIS — M25519 Pain in unspecified shoulder: Secondary | ICD-10-CM | POA: Diagnosis not present

## 2019-04-09 DIAGNOSIS — Z90410 Acquired total absence of pancreas: Secondary | ICD-10-CM | POA: Diagnosis not present

## 2019-04-09 DIAGNOSIS — I872 Venous insufficiency (chronic) (peripheral): Secondary | ICD-10-CM | POA: Diagnosis not present

## 2019-04-09 DIAGNOSIS — E161 Other hypoglycemia: Secondary | ICD-10-CM | POA: Diagnosis not present

## 2019-04-09 DIAGNOSIS — Z794 Long term (current) use of insulin: Secondary | ICD-10-CM | POA: Diagnosis not present

## 2019-04-09 DIAGNOSIS — M1990 Unspecified osteoarthritis, unspecified site: Secondary | ICD-10-CM | POA: Diagnosis not present

## 2019-04-17 DIAGNOSIS — F331 Major depressive disorder, recurrent, moderate: Secondary | ICD-10-CM | POA: Diagnosis not present

## 2019-04-17 DIAGNOSIS — F5101 Primary insomnia: Secondary | ICD-10-CM | POA: Diagnosis not present

## 2019-04-17 DIAGNOSIS — F4312 Post-traumatic stress disorder, chronic: Secondary | ICD-10-CM | POA: Diagnosis not present

## 2019-04-17 DIAGNOSIS — F411 Generalized anxiety disorder: Secondary | ICD-10-CM | POA: Diagnosis not present

## 2019-04-19 DIAGNOSIS — N390 Urinary tract infection, site not specified: Secondary | ICD-10-CM

## 2019-04-19 DIAGNOSIS — T83511A Infection and inflammatory reaction due to indwelling urethral catheter, initial encounter: Secondary | ICD-10-CM

## 2019-04-19 DIAGNOSIS — I5042 Chronic combined systolic (congestive) and diastolic (congestive) heart failure: Secondary | ICD-10-CM | POA: Diagnosis not present

## 2019-04-19 DIAGNOSIS — E162 Hypoglycemia, unspecified: Secondary | ICD-10-CM | POA: Diagnosis not present

## 2019-04-19 DIAGNOSIS — E1165 Type 2 diabetes mellitus with hyperglycemia: Secondary | ICD-10-CM | POA: Diagnosis not present

## 2019-04-19 DIAGNOSIS — T83511D Infection and inflammatory reaction due to indwelling urethral catheter, subsequent encounter: Secondary | ICD-10-CM | POA: Diagnosis not present

## 2019-04-19 HISTORY — DX: Urinary tract infection, site not specified: N39.0

## 2019-04-19 HISTORY — DX: Infection and inflammatory reaction due to indwelling urethral catheter, initial encounter: T83.511A

## 2019-05-03 DIAGNOSIS — G8929 Other chronic pain: Secondary | ICD-10-CM | POA: Diagnosis not present

## 2019-05-03 DIAGNOSIS — I130 Hypertensive heart and chronic kidney disease with heart failure and stage 1 through stage 4 chronic kidney disease, or unspecified chronic kidney disease: Secondary | ICD-10-CM | POA: Diagnosis not present

## 2019-05-03 DIAGNOSIS — M1990 Unspecified osteoarthritis, unspecified site: Secondary | ICD-10-CM | POA: Diagnosis not present

## 2019-05-03 DIAGNOSIS — F4310 Post-traumatic stress disorder, unspecified: Secondary | ICD-10-CM | POA: Diagnosis not present

## 2019-05-03 DIAGNOSIS — G894 Chronic pain syndrome: Secondary | ICD-10-CM | POA: Diagnosis not present

## 2019-05-03 DIAGNOSIS — I447 Left bundle-branch block, unspecified: Secondary | ICD-10-CM | POA: Diagnosis not present

## 2019-05-03 DIAGNOSIS — E1151 Type 2 diabetes mellitus with diabetic peripheral angiopathy without gangrene: Secondary | ICD-10-CM | POA: Diagnosis not present

## 2019-05-03 DIAGNOSIS — J42 Unspecified chronic bronchitis: Secondary | ICD-10-CM | POA: Diagnosis not present

## 2019-05-03 DIAGNOSIS — F411 Generalized anxiety disorder: Secondary | ICD-10-CM | POA: Diagnosis not present

## 2019-05-03 DIAGNOSIS — I5022 Chronic systolic (congestive) heart failure: Secondary | ICD-10-CM | POA: Diagnosis not present

## 2019-05-03 DIAGNOSIS — E1122 Type 2 diabetes mellitus with diabetic chronic kidney disease: Secondary | ICD-10-CM | POA: Diagnosis not present

## 2019-05-03 DIAGNOSIS — S93401D Sprain of unspecified ligament of right ankle, subsequent encounter: Secondary | ICD-10-CM | POA: Diagnosis not present

## 2019-05-03 DIAGNOSIS — F4320 Adjustment disorder, unspecified: Secondary | ICD-10-CM | POA: Diagnosis not present

## 2019-05-03 DIAGNOSIS — N183 Chronic kidney disease, stage 3 unspecified: Secondary | ICD-10-CM | POA: Diagnosis not present

## 2019-05-03 DIAGNOSIS — E1142 Type 2 diabetes mellitus with diabetic polyneuropathy: Secondary | ICD-10-CM | POA: Diagnosis not present

## 2019-05-03 DIAGNOSIS — I671 Cerebral aneurysm, nonruptured: Secondary | ICD-10-CM | POA: Diagnosis not present

## 2019-05-10 DIAGNOSIS — M7989 Other specified soft tissue disorders: Secondary | ICD-10-CM | POA: Diagnosis not present

## 2019-05-10 DIAGNOSIS — M25562 Pain in left knee: Secondary | ICD-10-CM | POA: Diagnosis not present

## 2019-05-25 DIAGNOSIS — R531 Weakness: Secondary | ICD-10-CM | POA: Diagnosis not present

## 2019-05-25 DIAGNOSIS — R404 Transient alteration of awareness: Secondary | ICD-10-CM | POA: Diagnosis not present

## 2019-05-25 DIAGNOSIS — E162 Hypoglycemia, unspecified: Secondary | ICD-10-CM | POA: Diagnosis not present

## 2019-05-25 DIAGNOSIS — N390 Urinary tract infection, site not specified: Secondary | ICD-10-CM | POA: Diagnosis not present

## 2019-05-25 DIAGNOSIS — B9689 Other specified bacterial agents as the cause of diseases classified elsewhere: Secondary | ICD-10-CM | POA: Diagnosis not present

## 2019-05-25 DIAGNOSIS — E161 Other hypoglycemia: Secondary | ICD-10-CM | POA: Diagnosis not present

## 2019-05-26 DIAGNOSIS — R600 Localized edema: Secondary | ICD-10-CM | POA: Diagnosis not present

## 2019-05-26 DIAGNOSIS — I509 Heart failure, unspecified: Secondary | ICD-10-CM | POA: Diagnosis not present

## 2019-05-26 DIAGNOSIS — Z743 Need for continuous supervision: Secondary | ICD-10-CM | POA: Diagnosis not present

## 2019-05-26 DIAGNOSIS — F319 Bipolar disorder, unspecified: Secondary | ICD-10-CM | POA: Diagnosis not present

## 2019-05-26 DIAGNOSIS — I959 Hypotension, unspecified: Secondary | ICD-10-CM | POA: Diagnosis not present

## 2019-05-26 DIAGNOSIS — E1122 Type 2 diabetes mellitus with diabetic chronic kidney disease: Secondary | ICD-10-CM | POA: Diagnosis not present

## 2019-05-26 DIAGNOSIS — K219 Gastro-esophageal reflux disease without esophagitis: Secondary | ICD-10-CM | POA: Diagnosis not present

## 2019-05-26 DIAGNOSIS — N183 Chronic kidney disease, stage 3 unspecified: Secondary | ICD-10-CM | POA: Diagnosis not present

## 2019-05-26 DIAGNOSIS — N390 Urinary tract infection, site not specified: Secondary | ICD-10-CM | POA: Insufficient documentation

## 2019-05-26 DIAGNOSIS — R5381 Other malaise: Secondary | ICD-10-CM | POA: Diagnosis not present

## 2019-05-26 DIAGNOSIS — N39491 Coital incontinence: Secondary | ICD-10-CM | POA: Diagnosis not present

## 2019-05-26 DIAGNOSIS — R4182 Altered mental status, unspecified: Secondary | ICD-10-CM | POA: Diagnosis not present

## 2019-05-26 DIAGNOSIS — B961 Klebsiella pneumoniae [K. pneumoniae] as the cause of diseases classified elsewhere: Secondary | ICD-10-CM | POA: Diagnosis not present

## 2019-05-26 DIAGNOSIS — E1142 Type 2 diabetes mellitus with diabetic polyneuropathy: Secondary | ICD-10-CM | POA: Diagnosis not present

## 2019-05-26 DIAGNOSIS — R410 Disorientation, unspecified: Secondary | ICD-10-CM | POA: Diagnosis not present

## 2019-05-26 DIAGNOSIS — R279 Unspecified lack of coordination: Secondary | ICD-10-CM | POA: Diagnosis not present

## 2019-05-26 DIAGNOSIS — R000 Tachycardia, unspecified: Secondary | ICD-10-CM | POA: Diagnosis not present

## 2019-05-26 DIAGNOSIS — E161 Other hypoglycemia: Secondary | ICD-10-CM | POA: Diagnosis not present

## 2019-05-26 DIAGNOSIS — F418 Other specified anxiety disorders: Secondary | ICD-10-CM | POA: Diagnosis not present

## 2019-05-26 DIAGNOSIS — R531 Weakness: Secondary | ICD-10-CM | POA: Diagnosis not present

## 2019-05-26 DIAGNOSIS — I130 Hypertensive heart and chronic kidney disease with heart failure and stage 1 through stage 4 chronic kidney disease, or unspecified chronic kidney disease: Secondary | ICD-10-CM | POA: Diagnosis not present

## 2019-05-26 DIAGNOSIS — G4733 Obstructive sleep apnea (adult) (pediatric): Secondary | ICD-10-CM | POA: Diagnosis not present

## 2019-05-26 DIAGNOSIS — B9689 Other specified bacterial agents as the cause of diseases classified elsewhere: Secondary | ICD-10-CM | POA: Diagnosis not present

## 2019-05-26 DIAGNOSIS — M170 Bilateral primary osteoarthritis of knee: Secondary | ICD-10-CM | POA: Diagnosis not present

## 2019-05-26 DIAGNOSIS — E162 Hypoglycemia, unspecified: Secondary | ICD-10-CM | POA: Diagnosis not present

## 2019-05-26 DIAGNOSIS — R319 Hematuria, unspecified: Secondary | ICD-10-CM | POA: Diagnosis not present

## 2019-05-26 DIAGNOSIS — E8770 Fluid overload, unspecified: Secondary | ICD-10-CM | POA: Diagnosis not present

## 2019-05-27 DIAGNOSIS — B961 Klebsiella pneumoniae [K. pneumoniae] as the cause of diseases classified elsewhere: Secondary | ICD-10-CM | POA: Diagnosis not present

## 2019-05-27 DIAGNOSIS — R5381 Other malaise: Secondary | ICD-10-CM | POA: Diagnosis not present

## 2019-05-27 DIAGNOSIS — I509 Heart failure, unspecified: Secondary | ICD-10-CM | POA: Diagnosis not present

## 2019-05-27 DIAGNOSIS — F319 Bipolar disorder, unspecified: Secondary | ICD-10-CM | POA: Diagnosis not present

## 2019-05-27 DIAGNOSIS — G4733 Obstructive sleep apnea (adult) (pediatric): Secondary | ICD-10-CM | POA: Diagnosis not present

## 2019-05-27 DIAGNOSIS — N390 Urinary tract infection, site not specified: Secondary | ICD-10-CM | POA: Diagnosis not present

## 2019-05-27 DIAGNOSIS — I130 Hypertensive heart and chronic kidney disease with heart failure and stage 1 through stage 4 chronic kidney disease, or unspecified chronic kidney disease: Secondary | ICD-10-CM | POA: Diagnosis not present

## 2019-05-27 DIAGNOSIS — M170 Bilateral primary osteoarthritis of knee: Secondary | ICD-10-CM | POA: Diagnosis not present

## 2019-05-27 DIAGNOSIS — R531 Weakness: Secondary | ICD-10-CM | POA: Diagnosis not present

## 2019-05-27 DIAGNOSIS — K219 Gastro-esophageal reflux disease without esophagitis: Secondary | ICD-10-CM | POA: Diagnosis not present

## 2019-05-27 DIAGNOSIS — I959 Hypotension, unspecified: Secondary | ICD-10-CM | POA: Diagnosis not present

## 2019-05-27 DIAGNOSIS — R319 Hematuria, unspecified: Secondary | ICD-10-CM | POA: Diagnosis not present

## 2019-05-27 DIAGNOSIS — E1122 Type 2 diabetes mellitus with diabetic chronic kidney disease: Secondary | ICD-10-CM | POA: Diagnosis not present

## 2019-05-27 DIAGNOSIS — F418 Other specified anxiety disorders: Secondary | ICD-10-CM | POA: Diagnosis not present

## 2019-05-27 DIAGNOSIS — N183 Chronic kidney disease, stage 3 unspecified: Secondary | ICD-10-CM | POA: Diagnosis not present

## 2019-05-27 DIAGNOSIS — E1142 Type 2 diabetes mellitus with diabetic polyneuropathy: Secondary | ICD-10-CM | POA: Diagnosis not present

## 2019-05-28 DIAGNOSIS — N390 Urinary tract infection, site not specified: Secondary | ICD-10-CM | POA: Diagnosis not present

## 2019-05-28 DIAGNOSIS — R5381 Other malaise: Secondary | ICD-10-CM | POA: Diagnosis not present

## 2019-05-28 DIAGNOSIS — I130 Hypertensive heart and chronic kidney disease with heart failure and stage 1 through stage 4 chronic kidney disease, or unspecified chronic kidney disease: Secondary | ICD-10-CM | POA: Diagnosis not present

## 2019-05-28 DIAGNOSIS — B961 Klebsiella pneumoniae [K. pneumoniae] as the cause of diseases classified elsewhere: Secondary | ICD-10-CM | POA: Diagnosis not present

## 2019-05-28 DIAGNOSIS — F418 Other specified anxiety disorders: Secondary | ICD-10-CM | POA: Diagnosis not present

## 2019-05-28 DIAGNOSIS — F319 Bipolar disorder, unspecified: Secondary | ICD-10-CM | POA: Diagnosis not present

## 2019-05-28 DIAGNOSIS — N183 Chronic kidney disease, stage 3 unspecified: Secondary | ICD-10-CM | POA: Diagnosis not present

## 2019-05-28 DIAGNOSIS — I959 Hypotension, unspecified: Secondary | ICD-10-CM | POA: Diagnosis not present

## 2019-05-28 DIAGNOSIS — E1122 Type 2 diabetes mellitus with diabetic chronic kidney disease: Secondary | ICD-10-CM | POA: Diagnosis not present

## 2019-05-28 DIAGNOSIS — G4733 Obstructive sleep apnea (adult) (pediatric): Secondary | ICD-10-CM | POA: Diagnosis not present

## 2019-05-28 DIAGNOSIS — R319 Hematuria, unspecified: Secondary | ICD-10-CM | POA: Diagnosis not present

## 2019-05-28 DIAGNOSIS — M170 Bilateral primary osteoarthritis of knee: Secondary | ICD-10-CM | POA: Diagnosis not present

## 2019-05-28 DIAGNOSIS — K219 Gastro-esophageal reflux disease without esophagitis: Secondary | ICD-10-CM | POA: Diagnosis not present

## 2019-05-28 DIAGNOSIS — R531 Weakness: Secondary | ICD-10-CM | POA: Diagnosis not present

## 2019-05-28 DIAGNOSIS — I509 Heart failure, unspecified: Secondary | ICD-10-CM | POA: Diagnosis not present

## 2019-05-28 DIAGNOSIS — E1142 Type 2 diabetes mellitus with diabetic polyneuropathy: Secondary | ICD-10-CM | POA: Diagnosis not present

## 2019-05-29 DIAGNOSIS — I509 Heart failure, unspecified: Secondary | ICD-10-CM | POA: Diagnosis not present

## 2019-05-29 DIAGNOSIS — K219 Gastro-esophageal reflux disease without esophagitis: Secondary | ICD-10-CM | POA: Diagnosis not present

## 2019-05-29 DIAGNOSIS — N390 Urinary tract infection, site not specified: Secondary | ICD-10-CM | POA: Diagnosis not present

## 2019-05-29 DIAGNOSIS — M170 Bilateral primary osteoarthritis of knee: Secondary | ICD-10-CM | POA: Diagnosis not present

## 2019-05-29 DIAGNOSIS — R319 Hematuria, unspecified: Secondary | ICD-10-CM | POA: Diagnosis not present

## 2019-05-29 DIAGNOSIS — N183 Chronic kidney disease, stage 3 unspecified: Secondary | ICD-10-CM | POA: Diagnosis not present

## 2019-05-29 DIAGNOSIS — F319 Bipolar disorder, unspecified: Secondary | ICD-10-CM | POA: Diagnosis not present

## 2019-05-29 DIAGNOSIS — I130 Hypertensive heart and chronic kidney disease with heart failure and stage 1 through stage 4 chronic kidney disease, or unspecified chronic kidney disease: Secondary | ICD-10-CM | POA: Diagnosis not present

## 2019-05-29 DIAGNOSIS — G4733 Obstructive sleep apnea (adult) (pediatric): Secondary | ICD-10-CM | POA: Diagnosis not present

## 2019-05-29 DIAGNOSIS — F418 Other specified anxiety disorders: Secondary | ICD-10-CM | POA: Diagnosis not present

## 2019-05-29 DIAGNOSIS — R531 Weakness: Secondary | ICD-10-CM | POA: Diagnosis not present

## 2019-05-29 DIAGNOSIS — R5381 Other malaise: Secondary | ICD-10-CM | POA: Diagnosis not present

## 2019-05-29 DIAGNOSIS — I959 Hypotension, unspecified: Secondary | ICD-10-CM | POA: Diagnosis not present

## 2019-05-29 DIAGNOSIS — B961 Klebsiella pneumoniae [K. pneumoniae] as the cause of diseases classified elsewhere: Secondary | ICD-10-CM | POA: Diagnosis not present

## 2019-05-29 DIAGNOSIS — E1142 Type 2 diabetes mellitus with diabetic polyneuropathy: Secondary | ICD-10-CM | POA: Diagnosis not present

## 2019-05-29 DIAGNOSIS — E1122 Type 2 diabetes mellitus with diabetic chronic kidney disease: Secondary | ICD-10-CM | POA: Diagnosis not present

## 2019-05-30 DIAGNOSIS — I130 Hypertensive heart and chronic kidney disease with heart failure and stage 1 through stage 4 chronic kidney disease, or unspecified chronic kidney disease: Secondary | ICD-10-CM | POA: Diagnosis not present

## 2019-05-30 DIAGNOSIS — N183 Chronic kidney disease, stage 3 unspecified: Secondary | ICD-10-CM | POA: Diagnosis not present

## 2019-05-30 DIAGNOSIS — R319 Hematuria, unspecified: Secondary | ICD-10-CM | POA: Diagnosis not present

## 2019-05-30 DIAGNOSIS — I509 Heart failure, unspecified: Secondary | ICD-10-CM | POA: Diagnosis not present

## 2019-05-30 DIAGNOSIS — E1122 Type 2 diabetes mellitus with diabetic chronic kidney disease: Secondary | ICD-10-CM | POA: Diagnosis not present

## 2019-05-30 DIAGNOSIS — F418 Other specified anxiety disorders: Secondary | ICD-10-CM | POA: Diagnosis not present

## 2019-05-30 DIAGNOSIS — G4733 Obstructive sleep apnea (adult) (pediatric): Secondary | ICD-10-CM | POA: Diagnosis not present

## 2019-05-30 DIAGNOSIS — B961 Klebsiella pneumoniae [K. pneumoniae] as the cause of diseases classified elsewhere: Secondary | ICD-10-CM | POA: Diagnosis not present

## 2019-05-30 DIAGNOSIS — N390 Urinary tract infection, site not specified: Secondary | ICD-10-CM | POA: Diagnosis not present

## 2019-05-30 DIAGNOSIS — M170 Bilateral primary osteoarthritis of knee: Secondary | ICD-10-CM | POA: Diagnosis not present

## 2019-05-30 DIAGNOSIS — K219 Gastro-esophageal reflux disease without esophagitis: Secondary | ICD-10-CM | POA: Diagnosis not present

## 2019-05-30 DIAGNOSIS — E1142 Type 2 diabetes mellitus with diabetic polyneuropathy: Secondary | ICD-10-CM | POA: Diagnosis not present

## 2019-05-30 DIAGNOSIS — R5381 Other malaise: Secondary | ICD-10-CM | POA: Diagnosis not present

## 2019-05-30 DIAGNOSIS — F319 Bipolar disorder, unspecified: Secondary | ICD-10-CM | POA: Diagnosis not present

## 2019-05-30 DIAGNOSIS — I959 Hypotension, unspecified: Secondary | ICD-10-CM | POA: Diagnosis not present

## 2019-05-30 DIAGNOSIS — R531 Weakness: Secondary | ICD-10-CM | POA: Diagnosis not present

## 2019-05-31 DIAGNOSIS — G4733 Obstructive sleep apnea (adult) (pediatric): Secondary | ICD-10-CM | POA: Diagnosis not present

## 2019-05-31 DIAGNOSIS — M170 Bilateral primary osteoarthritis of knee: Secondary | ICD-10-CM | POA: Diagnosis not present

## 2019-05-31 DIAGNOSIS — N183 Chronic kidney disease, stage 3 unspecified: Secondary | ICD-10-CM | POA: Diagnosis not present

## 2019-05-31 DIAGNOSIS — E1142 Type 2 diabetes mellitus with diabetic polyneuropathy: Secondary | ICD-10-CM | POA: Diagnosis not present

## 2019-05-31 DIAGNOSIS — I959 Hypotension, unspecified: Secondary | ICD-10-CM | POA: Diagnosis not present

## 2019-05-31 DIAGNOSIS — K219 Gastro-esophageal reflux disease without esophagitis: Secondary | ICD-10-CM | POA: Diagnosis not present

## 2019-05-31 DIAGNOSIS — F418 Other specified anxiety disorders: Secondary | ICD-10-CM | POA: Diagnosis not present

## 2019-05-31 DIAGNOSIS — E1122 Type 2 diabetes mellitus with diabetic chronic kidney disease: Secondary | ICD-10-CM | POA: Diagnosis not present

## 2019-05-31 DIAGNOSIS — I509 Heart failure, unspecified: Secondary | ICD-10-CM | POA: Diagnosis not present

## 2019-05-31 DIAGNOSIS — R319 Hematuria, unspecified: Secondary | ICD-10-CM | POA: Diagnosis not present

## 2019-05-31 DIAGNOSIS — B961 Klebsiella pneumoniae [K. pneumoniae] as the cause of diseases classified elsewhere: Secondary | ICD-10-CM | POA: Diagnosis not present

## 2019-05-31 DIAGNOSIS — N390 Urinary tract infection, site not specified: Secondary | ICD-10-CM | POA: Diagnosis not present

## 2019-05-31 DIAGNOSIS — R531 Weakness: Secondary | ICD-10-CM | POA: Diagnosis not present

## 2019-05-31 DIAGNOSIS — R5381 Other malaise: Secondary | ICD-10-CM | POA: Diagnosis not present

## 2019-05-31 DIAGNOSIS — F319 Bipolar disorder, unspecified: Secondary | ICD-10-CM | POA: Diagnosis not present

## 2019-05-31 DIAGNOSIS — I130 Hypertensive heart and chronic kidney disease with heart failure and stage 1 through stage 4 chronic kidney disease, or unspecified chronic kidney disease: Secondary | ICD-10-CM | POA: Diagnosis not present

## 2019-06-01 DIAGNOSIS — E1142 Type 2 diabetes mellitus with diabetic polyneuropathy: Secondary | ICD-10-CM | POA: Diagnosis not present

## 2019-06-01 DIAGNOSIS — B961 Klebsiella pneumoniae [K. pneumoniae] as the cause of diseases classified elsewhere: Secondary | ICD-10-CM | POA: Diagnosis not present

## 2019-06-01 DIAGNOSIS — E1122 Type 2 diabetes mellitus with diabetic chronic kidney disease: Secondary | ICD-10-CM | POA: Diagnosis not present

## 2019-06-01 DIAGNOSIS — R5381 Other malaise: Secondary | ICD-10-CM | POA: Diagnosis not present

## 2019-06-01 DIAGNOSIS — R319 Hematuria, unspecified: Secondary | ICD-10-CM | POA: Diagnosis not present

## 2019-06-01 DIAGNOSIS — I509 Heart failure, unspecified: Secondary | ICD-10-CM | POA: Diagnosis not present

## 2019-06-01 DIAGNOSIS — G4733 Obstructive sleep apnea (adult) (pediatric): Secondary | ICD-10-CM | POA: Diagnosis not present

## 2019-06-01 DIAGNOSIS — N183 Chronic kidney disease, stage 3 unspecified: Secondary | ICD-10-CM | POA: Diagnosis not present

## 2019-06-01 DIAGNOSIS — N390 Urinary tract infection, site not specified: Secondary | ICD-10-CM | POA: Diagnosis not present

## 2019-06-01 DIAGNOSIS — F319 Bipolar disorder, unspecified: Secondary | ICD-10-CM | POA: Diagnosis not present

## 2019-06-01 DIAGNOSIS — M170 Bilateral primary osteoarthritis of knee: Secondary | ICD-10-CM | POA: Diagnosis not present

## 2019-06-01 DIAGNOSIS — I130 Hypertensive heart and chronic kidney disease with heart failure and stage 1 through stage 4 chronic kidney disease, or unspecified chronic kidney disease: Secondary | ICD-10-CM | POA: Diagnosis not present

## 2019-06-01 DIAGNOSIS — K219 Gastro-esophageal reflux disease without esophagitis: Secondary | ICD-10-CM | POA: Diagnosis not present

## 2019-06-01 DIAGNOSIS — I959 Hypotension, unspecified: Secondary | ICD-10-CM | POA: Diagnosis not present

## 2019-06-01 DIAGNOSIS — R531 Weakness: Secondary | ICD-10-CM | POA: Diagnosis not present

## 2019-06-01 DIAGNOSIS — F418 Other specified anxiety disorders: Secondary | ICD-10-CM | POA: Diagnosis not present

## 2019-06-02 DIAGNOSIS — I509 Heart failure, unspecified: Secondary | ICD-10-CM | POA: Diagnosis not present

## 2019-06-02 DIAGNOSIS — K219 Gastro-esophageal reflux disease without esophagitis: Secondary | ICD-10-CM | POA: Diagnosis not present

## 2019-06-02 DIAGNOSIS — R5381 Other malaise: Secondary | ICD-10-CM | POA: Diagnosis not present

## 2019-06-02 DIAGNOSIS — R319 Hematuria, unspecified: Secondary | ICD-10-CM | POA: Diagnosis not present

## 2019-06-02 DIAGNOSIS — B961 Klebsiella pneumoniae [K. pneumoniae] as the cause of diseases classified elsewhere: Secondary | ICD-10-CM | POA: Diagnosis not present

## 2019-06-02 DIAGNOSIS — M170 Bilateral primary osteoarthritis of knee: Secondary | ICD-10-CM | POA: Diagnosis not present

## 2019-06-02 DIAGNOSIS — E1142 Type 2 diabetes mellitus with diabetic polyneuropathy: Secondary | ICD-10-CM | POA: Diagnosis not present

## 2019-06-02 DIAGNOSIS — E1122 Type 2 diabetes mellitus with diabetic chronic kidney disease: Secondary | ICD-10-CM | POA: Diagnosis not present

## 2019-06-02 DIAGNOSIS — I959 Hypotension, unspecified: Secondary | ICD-10-CM | POA: Diagnosis not present

## 2019-06-02 DIAGNOSIS — G4733 Obstructive sleep apnea (adult) (pediatric): Secondary | ICD-10-CM | POA: Diagnosis not present

## 2019-06-02 DIAGNOSIS — F418 Other specified anxiety disorders: Secondary | ICD-10-CM | POA: Diagnosis not present

## 2019-06-02 DIAGNOSIS — N183 Chronic kidney disease, stage 3 unspecified: Secondary | ICD-10-CM | POA: Diagnosis not present

## 2019-06-02 DIAGNOSIS — F319 Bipolar disorder, unspecified: Secondary | ICD-10-CM | POA: Diagnosis not present

## 2019-06-02 DIAGNOSIS — N390 Urinary tract infection, site not specified: Secondary | ICD-10-CM | POA: Diagnosis not present

## 2019-06-02 DIAGNOSIS — I130 Hypertensive heart and chronic kidney disease with heart failure and stage 1 through stage 4 chronic kidney disease, or unspecified chronic kidney disease: Secondary | ICD-10-CM | POA: Diagnosis not present

## 2019-06-02 DIAGNOSIS — R531 Weakness: Secondary | ICD-10-CM | POA: Diagnosis not present

## 2019-06-03 DIAGNOSIS — F418 Other specified anxiety disorders: Secondary | ICD-10-CM | POA: Diagnosis not present

## 2019-06-03 DIAGNOSIS — E1122 Type 2 diabetes mellitus with diabetic chronic kidney disease: Secondary | ICD-10-CM | POA: Diagnosis not present

## 2019-06-03 DIAGNOSIS — G4733 Obstructive sleep apnea (adult) (pediatric): Secondary | ICD-10-CM | POA: Diagnosis not present

## 2019-06-03 DIAGNOSIS — I130 Hypertensive heart and chronic kidney disease with heart failure and stage 1 through stage 4 chronic kidney disease, or unspecified chronic kidney disease: Secondary | ICD-10-CM | POA: Diagnosis not present

## 2019-06-03 DIAGNOSIS — K219 Gastro-esophageal reflux disease without esophagitis: Secondary | ICD-10-CM | POA: Diagnosis not present

## 2019-06-03 DIAGNOSIS — F319 Bipolar disorder, unspecified: Secondary | ICD-10-CM | POA: Diagnosis not present

## 2019-06-03 DIAGNOSIS — I509 Heart failure, unspecified: Secondary | ICD-10-CM | POA: Diagnosis not present

## 2019-06-03 DIAGNOSIS — B961 Klebsiella pneumoniae [K. pneumoniae] as the cause of diseases classified elsewhere: Secondary | ICD-10-CM | POA: Diagnosis not present

## 2019-06-03 DIAGNOSIS — M170 Bilateral primary osteoarthritis of knee: Secondary | ICD-10-CM | POA: Diagnosis not present

## 2019-06-03 DIAGNOSIS — I959 Hypotension, unspecified: Secondary | ICD-10-CM | POA: Diagnosis not present

## 2019-06-03 DIAGNOSIS — N183 Chronic kidney disease, stage 3 unspecified: Secondary | ICD-10-CM | POA: Diagnosis not present

## 2019-06-03 DIAGNOSIS — N390 Urinary tract infection, site not specified: Secondary | ICD-10-CM | POA: Diagnosis not present

## 2019-06-03 DIAGNOSIS — E1142 Type 2 diabetes mellitus with diabetic polyneuropathy: Secondary | ICD-10-CM | POA: Diagnosis not present

## 2019-06-03 DIAGNOSIS — R531 Weakness: Secondary | ICD-10-CM | POA: Diagnosis not present

## 2019-06-03 DIAGNOSIS — R5381 Other malaise: Secondary | ICD-10-CM | POA: Diagnosis not present

## 2019-06-03 DIAGNOSIS — R319 Hematuria, unspecified: Secondary | ICD-10-CM | POA: Diagnosis not present

## 2019-06-04 DIAGNOSIS — M170 Bilateral primary osteoarthritis of knee: Secondary | ICD-10-CM | POA: Diagnosis not present

## 2019-06-04 DIAGNOSIS — E1142 Type 2 diabetes mellitus with diabetic polyneuropathy: Secondary | ICD-10-CM | POA: Diagnosis not present

## 2019-06-04 DIAGNOSIS — R319 Hematuria, unspecified: Secondary | ICD-10-CM | POA: Diagnosis not present

## 2019-06-04 DIAGNOSIS — N183 Chronic kidney disease, stage 3 unspecified: Secondary | ICD-10-CM | POA: Diagnosis not present

## 2019-06-04 DIAGNOSIS — F319 Bipolar disorder, unspecified: Secondary | ICD-10-CM | POA: Diagnosis not present

## 2019-06-04 DIAGNOSIS — R5381 Other malaise: Secondary | ICD-10-CM | POA: Diagnosis not present

## 2019-06-04 DIAGNOSIS — K219 Gastro-esophageal reflux disease without esophagitis: Secondary | ICD-10-CM | POA: Diagnosis not present

## 2019-06-04 DIAGNOSIS — I130 Hypertensive heart and chronic kidney disease with heart failure and stage 1 through stage 4 chronic kidney disease, or unspecified chronic kidney disease: Secondary | ICD-10-CM | POA: Diagnosis not present

## 2019-06-04 DIAGNOSIS — I509 Heart failure, unspecified: Secondary | ICD-10-CM | POA: Diagnosis not present

## 2019-06-04 DIAGNOSIS — R531 Weakness: Secondary | ICD-10-CM | POA: Diagnosis not present

## 2019-06-04 DIAGNOSIS — G4733 Obstructive sleep apnea (adult) (pediatric): Secondary | ICD-10-CM | POA: Diagnosis not present

## 2019-06-04 DIAGNOSIS — F418 Other specified anxiety disorders: Secondary | ICD-10-CM | POA: Diagnosis not present

## 2019-06-04 DIAGNOSIS — N390 Urinary tract infection, site not specified: Secondary | ICD-10-CM | POA: Diagnosis not present

## 2019-06-04 DIAGNOSIS — I959 Hypotension, unspecified: Secondary | ICD-10-CM | POA: Diagnosis not present

## 2019-06-04 DIAGNOSIS — B961 Klebsiella pneumoniae [K. pneumoniae] as the cause of diseases classified elsewhere: Secondary | ICD-10-CM | POA: Diagnosis not present

## 2019-06-04 DIAGNOSIS — E1122 Type 2 diabetes mellitus with diabetic chronic kidney disease: Secondary | ICD-10-CM | POA: Diagnosis not present

## 2019-06-05 DIAGNOSIS — R319 Hematuria, unspecified: Secondary | ICD-10-CM | POA: Diagnosis not present

## 2019-06-05 DIAGNOSIS — K219 Gastro-esophageal reflux disease without esophagitis: Secondary | ICD-10-CM | POA: Diagnosis not present

## 2019-06-05 DIAGNOSIS — M170 Bilateral primary osteoarthritis of knee: Secondary | ICD-10-CM | POA: Diagnosis not present

## 2019-06-05 DIAGNOSIS — N390 Urinary tract infection, site not specified: Secondary | ICD-10-CM | POA: Diagnosis not present

## 2019-06-05 DIAGNOSIS — B961 Klebsiella pneumoniae [K. pneumoniae] as the cause of diseases classified elsewhere: Secondary | ICD-10-CM | POA: Diagnosis not present

## 2019-06-05 DIAGNOSIS — I130 Hypertensive heart and chronic kidney disease with heart failure and stage 1 through stage 4 chronic kidney disease, or unspecified chronic kidney disease: Secondary | ICD-10-CM | POA: Diagnosis not present

## 2019-06-05 DIAGNOSIS — E1122 Type 2 diabetes mellitus with diabetic chronic kidney disease: Secondary | ICD-10-CM | POA: Diagnosis not present

## 2019-06-05 DIAGNOSIS — I509 Heart failure, unspecified: Secondary | ICD-10-CM | POA: Diagnosis not present

## 2019-06-05 DIAGNOSIS — N183 Chronic kidney disease, stage 3 unspecified: Secondary | ICD-10-CM | POA: Diagnosis not present

## 2019-06-05 DIAGNOSIS — E1142 Type 2 diabetes mellitus with diabetic polyneuropathy: Secondary | ICD-10-CM | POA: Diagnosis not present

## 2019-06-05 DIAGNOSIS — I959 Hypotension, unspecified: Secondary | ICD-10-CM | POA: Diagnosis not present

## 2019-06-05 DIAGNOSIS — R5381 Other malaise: Secondary | ICD-10-CM | POA: Diagnosis not present

## 2019-06-05 DIAGNOSIS — G4733 Obstructive sleep apnea (adult) (pediatric): Secondary | ICD-10-CM | POA: Diagnosis not present

## 2019-06-05 DIAGNOSIS — R531 Weakness: Secondary | ICD-10-CM | POA: Diagnosis not present

## 2019-06-05 DIAGNOSIS — F319 Bipolar disorder, unspecified: Secondary | ICD-10-CM | POA: Diagnosis not present

## 2019-06-05 DIAGNOSIS — F418 Other specified anxiety disorders: Secondary | ICD-10-CM | POA: Diagnosis not present

## 2019-06-06 DIAGNOSIS — I509 Heart failure, unspecified: Secondary | ICD-10-CM | POA: Diagnosis not present

## 2019-06-06 DIAGNOSIS — E1122 Type 2 diabetes mellitus with diabetic chronic kidney disease: Secondary | ICD-10-CM | POA: Diagnosis not present

## 2019-06-06 DIAGNOSIS — F319 Bipolar disorder, unspecified: Secondary | ICD-10-CM | POA: Diagnosis not present

## 2019-06-06 DIAGNOSIS — I959 Hypotension, unspecified: Secondary | ICD-10-CM | POA: Diagnosis not present

## 2019-06-06 DIAGNOSIS — E1142 Type 2 diabetes mellitus with diabetic polyneuropathy: Secondary | ICD-10-CM | POA: Diagnosis not present

## 2019-06-06 DIAGNOSIS — K219 Gastro-esophageal reflux disease without esophagitis: Secondary | ICD-10-CM | POA: Diagnosis not present

## 2019-06-06 DIAGNOSIS — R319 Hematuria, unspecified: Secondary | ICD-10-CM | POA: Diagnosis not present

## 2019-06-06 DIAGNOSIS — M170 Bilateral primary osteoarthritis of knee: Secondary | ICD-10-CM | POA: Diagnosis not present

## 2019-06-06 DIAGNOSIS — B961 Klebsiella pneumoniae [K. pneumoniae] as the cause of diseases classified elsewhere: Secondary | ICD-10-CM | POA: Diagnosis not present

## 2019-06-06 DIAGNOSIS — F418 Other specified anxiety disorders: Secondary | ICD-10-CM | POA: Diagnosis not present

## 2019-06-06 DIAGNOSIS — G4733 Obstructive sleep apnea (adult) (pediatric): Secondary | ICD-10-CM | POA: Diagnosis not present

## 2019-06-06 DIAGNOSIS — R531 Weakness: Secondary | ICD-10-CM | POA: Diagnosis not present

## 2019-06-06 DIAGNOSIS — N390 Urinary tract infection, site not specified: Secondary | ICD-10-CM | POA: Diagnosis not present

## 2019-06-06 DIAGNOSIS — R5381 Other malaise: Secondary | ICD-10-CM | POA: Diagnosis not present

## 2019-06-06 DIAGNOSIS — I130 Hypertensive heart and chronic kidney disease with heart failure and stage 1 through stage 4 chronic kidney disease, or unspecified chronic kidney disease: Secondary | ICD-10-CM | POA: Diagnosis not present

## 2019-06-06 DIAGNOSIS — N183 Chronic kidney disease, stage 3 unspecified: Secondary | ICD-10-CM | POA: Diagnosis not present

## 2019-06-07 DIAGNOSIS — G4733 Obstructive sleep apnea (adult) (pediatric): Secondary | ICD-10-CM | POA: Diagnosis not present

## 2019-06-07 DIAGNOSIS — I130 Hypertensive heart and chronic kidney disease with heart failure and stage 1 through stage 4 chronic kidney disease, or unspecified chronic kidney disease: Secondary | ICD-10-CM | POA: Diagnosis not present

## 2019-06-07 DIAGNOSIS — F418 Other specified anxiety disorders: Secondary | ICD-10-CM | POA: Diagnosis not present

## 2019-06-07 DIAGNOSIS — F319 Bipolar disorder, unspecified: Secondary | ICD-10-CM | POA: Diagnosis not present

## 2019-06-07 DIAGNOSIS — R319 Hematuria, unspecified: Secondary | ICD-10-CM | POA: Diagnosis not present

## 2019-06-07 DIAGNOSIS — I509 Heart failure, unspecified: Secondary | ICD-10-CM | POA: Diagnosis not present

## 2019-06-07 DIAGNOSIS — E1142 Type 2 diabetes mellitus with diabetic polyneuropathy: Secondary | ICD-10-CM | POA: Diagnosis not present

## 2019-06-07 DIAGNOSIS — R5381 Other malaise: Secondary | ICD-10-CM | POA: Diagnosis not present

## 2019-06-07 DIAGNOSIS — M170 Bilateral primary osteoarthritis of knee: Secondary | ICD-10-CM | POA: Diagnosis not present

## 2019-06-07 DIAGNOSIS — N183 Chronic kidney disease, stage 3 unspecified: Secondary | ICD-10-CM | POA: Diagnosis not present

## 2019-06-07 DIAGNOSIS — K219 Gastro-esophageal reflux disease without esophagitis: Secondary | ICD-10-CM | POA: Diagnosis not present

## 2019-06-07 DIAGNOSIS — E1122 Type 2 diabetes mellitus with diabetic chronic kidney disease: Secondary | ICD-10-CM | POA: Diagnosis not present

## 2019-06-07 DIAGNOSIS — I959 Hypotension, unspecified: Secondary | ICD-10-CM | POA: Diagnosis not present

## 2019-06-07 DIAGNOSIS — B961 Klebsiella pneumoniae [K. pneumoniae] as the cause of diseases classified elsewhere: Secondary | ICD-10-CM | POA: Diagnosis not present

## 2019-06-07 DIAGNOSIS — N390 Urinary tract infection, site not specified: Secondary | ICD-10-CM | POA: Diagnosis not present

## 2019-06-07 DIAGNOSIS — R531 Weakness: Secondary | ICD-10-CM | POA: Diagnosis not present

## 2019-06-08 DIAGNOSIS — G4733 Obstructive sleep apnea (adult) (pediatric): Secondary | ICD-10-CM | POA: Diagnosis not present

## 2019-06-08 DIAGNOSIS — R531 Weakness: Secondary | ICD-10-CM | POA: Diagnosis not present

## 2019-06-08 DIAGNOSIS — M170 Bilateral primary osteoarthritis of knee: Secondary | ICD-10-CM | POA: Diagnosis not present

## 2019-06-08 DIAGNOSIS — I959 Hypotension, unspecified: Secondary | ICD-10-CM | POA: Diagnosis not present

## 2019-06-08 DIAGNOSIS — R5381 Other malaise: Secondary | ICD-10-CM | POA: Diagnosis not present

## 2019-06-08 DIAGNOSIS — I130 Hypertensive heart and chronic kidney disease with heart failure and stage 1 through stage 4 chronic kidney disease, or unspecified chronic kidney disease: Secondary | ICD-10-CM | POA: Diagnosis not present

## 2019-06-08 DIAGNOSIS — E1122 Type 2 diabetes mellitus with diabetic chronic kidney disease: Secondary | ICD-10-CM | POA: Diagnosis not present

## 2019-06-08 DIAGNOSIS — E1142 Type 2 diabetes mellitus with diabetic polyneuropathy: Secondary | ICD-10-CM | POA: Diagnosis not present

## 2019-06-08 DIAGNOSIS — R319 Hematuria, unspecified: Secondary | ICD-10-CM | POA: Diagnosis not present

## 2019-06-08 DIAGNOSIS — I509 Heart failure, unspecified: Secondary | ICD-10-CM | POA: Diagnosis not present

## 2019-06-08 DIAGNOSIS — N183 Chronic kidney disease, stage 3 unspecified: Secondary | ICD-10-CM | POA: Diagnosis not present

## 2019-06-08 DIAGNOSIS — B961 Klebsiella pneumoniae [K. pneumoniae] as the cause of diseases classified elsewhere: Secondary | ICD-10-CM | POA: Diagnosis not present

## 2019-06-08 DIAGNOSIS — N390 Urinary tract infection, site not specified: Secondary | ICD-10-CM | POA: Diagnosis not present

## 2019-06-08 DIAGNOSIS — K219 Gastro-esophageal reflux disease without esophagitis: Secondary | ICD-10-CM | POA: Diagnosis not present

## 2019-06-08 DIAGNOSIS — F418 Other specified anxiety disorders: Secondary | ICD-10-CM | POA: Diagnosis not present

## 2019-06-08 DIAGNOSIS — F319 Bipolar disorder, unspecified: Secondary | ICD-10-CM | POA: Diagnosis not present

## 2019-06-09 DIAGNOSIS — N390 Urinary tract infection, site not specified: Secondary | ICD-10-CM | POA: Diagnosis not present

## 2019-06-09 DIAGNOSIS — R319 Hematuria, unspecified: Secondary | ICD-10-CM | POA: Diagnosis not present

## 2019-06-09 DIAGNOSIS — G4733 Obstructive sleep apnea (adult) (pediatric): Secondary | ICD-10-CM | POA: Diagnosis not present

## 2019-06-09 DIAGNOSIS — R531 Weakness: Secondary | ICD-10-CM | POA: Diagnosis not present

## 2019-06-09 DIAGNOSIS — I130 Hypertensive heart and chronic kidney disease with heart failure and stage 1 through stage 4 chronic kidney disease, or unspecified chronic kidney disease: Secondary | ICD-10-CM | POA: Diagnosis not present

## 2019-06-09 DIAGNOSIS — E1142 Type 2 diabetes mellitus with diabetic polyneuropathy: Secondary | ICD-10-CM | POA: Diagnosis not present

## 2019-06-09 DIAGNOSIS — E1122 Type 2 diabetes mellitus with diabetic chronic kidney disease: Secondary | ICD-10-CM | POA: Diagnosis not present

## 2019-06-09 DIAGNOSIS — I959 Hypotension, unspecified: Secondary | ICD-10-CM | POA: Diagnosis not present

## 2019-06-09 DIAGNOSIS — F319 Bipolar disorder, unspecified: Secondary | ICD-10-CM | POA: Diagnosis not present

## 2019-06-09 DIAGNOSIS — K219 Gastro-esophageal reflux disease without esophagitis: Secondary | ICD-10-CM | POA: Diagnosis not present

## 2019-06-09 DIAGNOSIS — B961 Klebsiella pneumoniae [K. pneumoniae] as the cause of diseases classified elsewhere: Secondary | ICD-10-CM | POA: Diagnosis not present

## 2019-06-09 DIAGNOSIS — N183 Chronic kidney disease, stage 3 unspecified: Secondary | ICD-10-CM | POA: Diagnosis not present

## 2019-06-09 DIAGNOSIS — I509 Heart failure, unspecified: Secondary | ICD-10-CM | POA: Diagnosis not present

## 2019-06-09 DIAGNOSIS — R5381 Other malaise: Secondary | ICD-10-CM | POA: Diagnosis not present

## 2019-06-09 DIAGNOSIS — M170 Bilateral primary osteoarthritis of knee: Secondary | ICD-10-CM | POA: Diagnosis not present

## 2019-06-09 DIAGNOSIS — F418 Other specified anxiety disorders: Secondary | ICD-10-CM | POA: Diagnosis not present

## 2019-06-10 DIAGNOSIS — R319 Hematuria, unspecified: Secondary | ICD-10-CM | POA: Diagnosis not present

## 2019-06-10 DIAGNOSIS — N183 Chronic kidney disease, stage 3 unspecified: Secondary | ICD-10-CM | POA: Diagnosis not present

## 2019-06-10 DIAGNOSIS — F319 Bipolar disorder, unspecified: Secondary | ICD-10-CM | POA: Diagnosis not present

## 2019-06-10 DIAGNOSIS — F418 Other specified anxiety disorders: Secondary | ICD-10-CM | POA: Diagnosis not present

## 2019-06-10 DIAGNOSIS — E1122 Type 2 diabetes mellitus with diabetic chronic kidney disease: Secondary | ICD-10-CM | POA: Diagnosis not present

## 2019-06-10 DIAGNOSIS — M170 Bilateral primary osteoarthritis of knee: Secondary | ICD-10-CM | POA: Diagnosis not present

## 2019-06-10 DIAGNOSIS — R5381 Other malaise: Secondary | ICD-10-CM | POA: Diagnosis not present

## 2019-06-10 DIAGNOSIS — N390 Urinary tract infection, site not specified: Secondary | ICD-10-CM | POA: Diagnosis not present

## 2019-06-10 DIAGNOSIS — I130 Hypertensive heart and chronic kidney disease with heart failure and stage 1 through stage 4 chronic kidney disease, or unspecified chronic kidney disease: Secondary | ICD-10-CM | POA: Diagnosis not present

## 2019-06-10 DIAGNOSIS — I509 Heart failure, unspecified: Secondary | ICD-10-CM | POA: Diagnosis not present

## 2019-06-10 DIAGNOSIS — B961 Klebsiella pneumoniae [K. pneumoniae] as the cause of diseases classified elsewhere: Secondary | ICD-10-CM | POA: Diagnosis not present

## 2019-06-10 DIAGNOSIS — R531 Weakness: Secondary | ICD-10-CM | POA: Diagnosis not present

## 2019-06-10 DIAGNOSIS — G4733 Obstructive sleep apnea (adult) (pediatric): Secondary | ICD-10-CM | POA: Diagnosis not present

## 2019-06-10 DIAGNOSIS — E1142 Type 2 diabetes mellitus with diabetic polyneuropathy: Secondary | ICD-10-CM | POA: Diagnosis not present

## 2019-06-10 DIAGNOSIS — I959 Hypotension, unspecified: Secondary | ICD-10-CM | POA: Diagnosis not present

## 2019-06-10 DIAGNOSIS — K219 Gastro-esophageal reflux disease without esophagitis: Secondary | ICD-10-CM | POA: Diagnosis not present

## 2019-06-11 DIAGNOSIS — B961 Klebsiella pneumoniae [K. pneumoniae] as the cause of diseases classified elsewhere: Secondary | ICD-10-CM | POA: Diagnosis not present

## 2019-06-11 DIAGNOSIS — R531 Weakness: Secondary | ICD-10-CM | POA: Diagnosis not present

## 2019-06-11 DIAGNOSIS — K219 Gastro-esophageal reflux disease without esophagitis: Secondary | ICD-10-CM | POA: Diagnosis not present

## 2019-06-11 DIAGNOSIS — M170 Bilateral primary osteoarthritis of knee: Secondary | ICD-10-CM | POA: Diagnosis not present

## 2019-06-11 DIAGNOSIS — N390 Urinary tract infection, site not specified: Secondary | ICD-10-CM | POA: Diagnosis not present

## 2019-06-11 DIAGNOSIS — G4733 Obstructive sleep apnea (adult) (pediatric): Secondary | ICD-10-CM | POA: Diagnosis not present

## 2019-06-11 DIAGNOSIS — I959 Hypotension, unspecified: Secondary | ICD-10-CM | POA: Diagnosis not present

## 2019-06-11 DIAGNOSIS — E1142 Type 2 diabetes mellitus with diabetic polyneuropathy: Secondary | ICD-10-CM | POA: Diagnosis not present

## 2019-06-11 DIAGNOSIS — R319 Hematuria, unspecified: Secondary | ICD-10-CM | POA: Diagnosis not present

## 2019-06-11 DIAGNOSIS — I130 Hypertensive heart and chronic kidney disease with heart failure and stage 1 through stage 4 chronic kidney disease, or unspecified chronic kidney disease: Secondary | ICD-10-CM | POA: Diagnosis not present

## 2019-06-11 DIAGNOSIS — E1122 Type 2 diabetes mellitus with diabetic chronic kidney disease: Secondary | ICD-10-CM | POA: Diagnosis not present

## 2019-06-11 DIAGNOSIS — F418 Other specified anxiety disorders: Secondary | ICD-10-CM | POA: Diagnosis not present

## 2019-06-11 DIAGNOSIS — F319 Bipolar disorder, unspecified: Secondary | ICD-10-CM | POA: Diagnosis not present

## 2019-06-11 DIAGNOSIS — N183 Chronic kidney disease, stage 3 unspecified: Secondary | ICD-10-CM | POA: Diagnosis not present

## 2019-06-11 DIAGNOSIS — I509 Heart failure, unspecified: Secondary | ICD-10-CM | POA: Diagnosis not present

## 2019-06-11 DIAGNOSIS — R5381 Other malaise: Secondary | ICD-10-CM | POA: Diagnosis not present

## 2019-06-12 DIAGNOSIS — R319 Hematuria, unspecified: Secondary | ICD-10-CM | POA: Diagnosis not present

## 2019-06-12 DIAGNOSIS — F418 Other specified anxiety disorders: Secondary | ICD-10-CM | POA: Diagnosis not present

## 2019-06-12 DIAGNOSIS — R5381 Other malaise: Secondary | ICD-10-CM | POA: Diagnosis not present

## 2019-06-12 DIAGNOSIS — E1142 Type 2 diabetes mellitus with diabetic polyneuropathy: Secondary | ICD-10-CM | POA: Diagnosis not present

## 2019-06-12 DIAGNOSIS — N183 Chronic kidney disease, stage 3 unspecified: Secondary | ICD-10-CM | POA: Diagnosis not present

## 2019-06-12 DIAGNOSIS — G4733 Obstructive sleep apnea (adult) (pediatric): Secondary | ICD-10-CM | POA: Diagnosis not present

## 2019-06-12 DIAGNOSIS — N390 Urinary tract infection, site not specified: Secondary | ICD-10-CM | POA: Diagnosis not present

## 2019-06-12 DIAGNOSIS — F319 Bipolar disorder, unspecified: Secondary | ICD-10-CM | POA: Diagnosis not present

## 2019-06-12 DIAGNOSIS — I959 Hypotension, unspecified: Secondary | ICD-10-CM | POA: Diagnosis not present

## 2019-06-12 DIAGNOSIS — R531 Weakness: Secondary | ICD-10-CM | POA: Diagnosis not present

## 2019-06-12 DIAGNOSIS — B961 Klebsiella pneumoniae [K. pneumoniae] as the cause of diseases classified elsewhere: Secondary | ICD-10-CM | POA: Diagnosis not present

## 2019-06-12 DIAGNOSIS — I130 Hypertensive heart and chronic kidney disease with heart failure and stage 1 through stage 4 chronic kidney disease, or unspecified chronic kidney disease: Secondary | ICD-10-CM | POA: Diagnosis not present

## 2019-06-12 DIAGNOSIS — I509 Heart failure, unspecified: Secondary | ICD-10-CM | POA: Diagnosis not present

## 2019-06-12 DIAGNOSIS — E1122 Type 2 diabetes mellitus with diabetic chronic kidney disease: Secondary | ICD-10-CM | POA: Diagnosis not present

## 2019-06-12 DIAGNOSIS — K219 Gastro-esophageal reflux disease without esophagitis: Secondary | ICD-10-CM | POA: Diagnosis not present

## 2019-06-12 DIAGNOSIS — M170 Bilateral primary osteoarthritis of knee: Secondary | ICD-10-CM | POA: Diagnosis not present

## 2019-06-13 DIAGNOSIS — B961 Klebsiella pneumoniae [K. pneumoniae] as the cause of diseases classified elsewhere: Secondary | ICD-10-CM | POA: Diagnosis not present

## 2019-06-13 DIAGNOSIS — I959 Hypotension, unspecified: Secondary | ICD-10-CM | POA: Diagnosis not present

## 2019-06-13 DIAGNOSIS — F319 Bipolar disorder, unspecified: Secondary | ICD-10-CM | POA: Diagnosis not present

## 2019-06-13 DIAGNOSIS — E1142 Type 2 diabetes mellitus with diabetic polyneuropathy: Secondary | ICD-10-CM | POA: Diagnosis not present

## 2019-06-13 DIAGNOSIS — I130 Hypertensive heart and chronic kidney disease with heart failure and stage 1 through stage 4 chronic kidney disease, or unspecified chronic kidney disease: Secondary | ICD-10-CM | POA: Diagnosis not present

## 2019-06-13 DIAGNOSIS — R5381 Other malaise: Secondary | ICD-10-CM | POA: Diagnosis not present

## 2019-06-13 DIAGNOSIS — R319 Hematuria, unspecified: Secondary | ICD-10-CM | POA: Diagnosis not present

## 2019-06-13 DIAGNOSIS — M170 Bilateral primary osteoarthritis of knee: Secondary | ICD-10-CM | POA: Diagnosis not present

## 2019-06-13 DIAGNOSIS — R531 Weakness: Secondary | ICD-10-CM | POA: Diagnosis not present

## 2019-06-13 DIAGNOSIS — G4733 Obstructive sleep apnea (adult) (pediatric): Secondary | ICD-10-CM | POA: Diagnosis not present

## 2019-06-13 DIAGNOSIS — I509 Heart failure, unspecified: Secondary | ICD-10-CM | POA: Diagnosis not present

## 2019-06-13 DIAGNOSIS — N183 Chronic kidney disease, stage 3 unspecified: Secondary | ICD-10-CM | POA: Diagnosis not present

## 2019-06-13 DIAGNOSIS — K219 Gastro-esophageal reflux disease without esophagitis: Secondary | ICD-10-CM | POA: Diagnosis not present

## 2019-06-13 DIAGNOSIS — F418 Other specified anxiety disorders: Secondary | ICD-10-CM | POA: Diagnosis not present

## 2019-06-13 DIAGNOSIS — E1122 Type 2 diabetes mellitus with diabetic chronic kidney disease: Secondary | ICD-10-CM | POA: Diagnosis not present

## 2019-06-13 DIAGNOSIS — N390 Urinary tract infection, site not specified: Secondary | ICD-10-CM | POA: Diagnosis not present

## 2019-06-14 DIAGNOSIS — I959 Hypotension, unspecified: Secondary | ICD-10-CM | POA: Diagnosis not present

## 2019-06-14 DIAGNOSIS — R5381 Other malaise: Secondary | ICD-10-CM | POA: Diagnosis not present

## 2019-06-14 DIAGNOSIS — B961 Klebsiella pneumoniae [K. pneumoniae] as the cause of diseases classified elsewhere: Secondary | ICD-10-CM | POA: Diagnosis not present

## 2019-06-14 DIAGNOSIS — N390 Urinary tract infection, site not specified: Secondary | ICD-10-CM | POA: Diagnosis not present

## 2019-06-14 DIAGNOSIS — I509 Heart failure, unspecified: Secondary | ICD-10-CM | POA: Diagnosis not present

## 2019-06-14 DIAGNOSIS — R531 Weakness: Secondary | ICD-10-CM | POA: Diagnosis not present

## 2019-06-14 DIAGNOSIS — N183 Chronic kidney disease, stage 3 unspecified: Secondary | ICD-10-CM | POA: Diagnosis not present

## 2019-06-14 DIAGNOSIS — I130 Hypertensive heart and chronic kidney disease with heart failure and stage 1 through stage 4 chronic kidney disease, or unspecified chronic kidney disease: Secondary | ICD-10-CM | POA: Diagnosis not present

## 2019-06-14 DIAGNOSIS — K219 Gastro-esophageal reflux disease without esophagitis: Secondary | ICD-10-CM | POA: Diagnosis not present

## 2019-06-14 DIAGNOSIS — G4733 Obstructive sleep apnea (adult) (pediatric): Secondary | ICD-10-CM | POA: Diagnosis not present

## 2019-06-14 DIAGNOSIS — M170 Bilateral primary osteoarthritis of knee: Secondary | ICD-10-CM | POA: Diagnosis not present

## 2019-06-14 DIAGNOSIS — F319 Bipolar disorder, unspecified: Secondary | ICD-10-CM | POA: Diagnosis not present

## 2019-06-14 DIAGNOSIS — F418 Other specified anxiety disorders: Secondary | ICD-10-CM | POA: Diagnosis not present

## 2019-06-14 DIAGNOSIS — E1122 Type 2 diabetes mellitus with diabetic chronic kidney disease: Secondary | ICD-10-CM | POA: Diagnosis not present

## 2019-06-14 DIAGNOSIS — E1142 Type 2 diabetes mellitus with diabetic polyneuropathy: Secondary | ICD-10-CM | POA: Diagnosis not present

## 2019-06-14 DIAGNOSIS — R319 Hematuria, unspecified: Secondary | ICD-10-CM | POA: Diagnosis not present

## 2019-06-15 DIAGNOSIS — N183 Chronic kidney disease, stage 3 unspecified: Secondary | ICD-10-CM | POA: Diagnosis not present

## 2019-06-15 DIAGNOSIS — E1142 Type 2 diabetes mellitus with diabetic polyneuropathy: Secondary | ICD-10-CM | POA: Diagnosis not present

## 2019-06-15 DIAGNOSIS — N390 Urinary tract infection, site not specified: Secondary | ICD-10-CM | POA: Diagnosis not present

## 2019-06-15 DIAGNOSIS — F418 Other specified anxiety disorders: Secondary | ICD-10-CM | POA: Diagnosis not present

## 2019-06-15 DIAGNOSIS — F319 Bipolar disorder, unspecified: Secondary | ICD-10-CM | POA: Diagnosis not present

## 2019-06-15 DIAGNOSIS — R319 Hematuria, unspecified: Secondary | ICD-10-CM | POA: Diagnosis not present

## 2019-06-15 DIAGNOSIS — K219 Gastro-esophageal reflux disease without esophagitis: Secondary | ICD-10-CM | POA: Diagnosis not present

## 2019-06-15 DIAGNOSIS — R5381 Other malaise: Secondary | ICD-10-CM | POA: Diagnosis not present

## 2019-06-15 DIAGNOSIS — R531 Weakness: Secondary | ICD-10-CM | POA: Diagnosis not present

## 2019-06-15 DIAGNOSIS — I509 Heart failure, unspecified: Secondary | ICD-10-CM | POA: Diagnosis not present

## 2019-06-15 DIAGNOSIS — E1122 Type 2 diabetes mellitus with diabetic chronic kidney disease: Secondary | ICD-10-CM | POA: Diagnosis not present

## 2019-06-15 DIAGNOSIS — I130 Hypertensive heart and chronic kidney disease with heart failure and stage 1 through stage 4 chronic kidney disease, or unspecified chronic kidney disease: Secondary | ICD-10-CM | POA: Diagnosis not present

## 2019-06-15 DIAGNOSIS — I959 Hypotension, unspecified: Secondary | ICD-10-CM | POA: Diagnosis not present

## 2019-06-15 DIAGNOSIS — B961 Klebsiella pneumoniae [K. pneumoniae] as the cause of diseases classified elsewhere: Secondary | ICD-10-CM | POA: Diagnosis not present

## 2019-06-15 DIAGNOSIS — M170 Bilateral primary osteoarthritis of knee: Secondary | ICD-10-CM | POA: Diagnosis not present

## 2019-06-15 DIAGNOSIS — G4733 Obstructive sleep apnea (adult) (pediatric): Secondary | ICD-10-CM | POA: Diagnosis not present

## 2019-06-16 DIAGNOSIS — N390 Urinary tract infection, site not specified: Secondary | ICD-10-CM | POA: Diagnosis not present

## 2019-06-16 DIAGNOSIS — B961 Klebsiella pneumoniae [K. pneumoniae] as the cause of diseases classified elsewhere: Secondary | ICD-10-CM | POA: Diagnosis not present

## 2019-06-16 DIAGNOSIS — F418 Other specified anxiety disorders: Secondary | ICD-10-CM | POA: Diagnosis not present

## 2019-06-16 DIAGNOSIS — K219 Gastro-esophageal reflux disease without esophagitis: Secondary | ICD-10-CM | POA: Diagnosis not present

## 2019-06-16 DIAGNOSIS — I959 Hypotension, unspecified: Secondary | ICD-10-CM | POA: Diagnosis not present

## 2019-06-16 DIAGNOSIS — R5381 Other malaise: Secondary | ICD-10-CM | POA: Diagnosis not present

## 2019-06-16 DIAGNOSIS — M170 Bilateral primary osteoarthritis of knee: Secondary | ICD-10-CM | POA: Diagnosis not present

## 2019-06-16 DIAGNOSIS — N183 Chronic kidney disease, stage 3 unspecified: Secondary | ICD-10-CM | POA: Diagnosis not present

## 2019-06-16 DIAGNOSIS — F319 Bipolar disorder, unspecified: Secondary | ICD-10-CM | POA: Diagnosis not present

## 2019-06-16 DIAGNOSIS — G4733 Obstructive sleep apnea (adult) (pediatric): Secondary | ICD-10-CM | POA: Diagnosis not present

## 2019-06-16 DIAGNOSIS — I509 Heart failure, unspecified: Secondary | ICD-10-CM | POA: Diagnosis not present

## 2019-06-16 DIAGNOSIS — E1142 Type 2 diabetes mellitus with diabetic polyneuropathy: Secondary | ICD-10-CM | POA: Diagnosis not present

## 2019-06-16 DIAGNOSIS — E1122 Type 2 diabetes mellitus with diabetic chronic kidney disease: Secondary | ICD-10-CM | POA: Diagnosis not present

## 2019-06-16 DIAGNOSIS — R531 Weakness: Secondary | ICD-10-CM | POA: Diagnosis not present

## 2019-06-16 DIAGNOSIS — I130 Hypertensive heart and chronic kidney disease with heart failure and stage 1 through stage 4 chronic kidney disease, or unspecified chronic kidney disease: Secondary | ICD-10-CM | POA: Diagnosis not present

## 2019-06-16 DIAGNOSIS — R319 Hematuria, unspecified: Secondary | ICD-10-CM | POA: Diagnosis not present

## 2019-06-17 DIAGNOSIS — R5381 Other malaise: Secondary | ICD-10-CM | POA: Diagnosis not present

## 2019-06-17 DIAGNOSIS — B961 Klebsiella pneumoniae [K. pneumoniae] as the cause of diseases classified elsewhere: Secondary | ICD-10-CM | POA: Diagnosis not present

## 2019-06-17 DIAGNOSIS — I509 Heart failure, unspecified: Secondary | ICD-10-CM | POA: Diagnosis not present

## 2019-06-17 DIAGNOSIS — E1142 Type 2 diabetes mellitus with diabetic polyneuropathy: Secondary | ICD-10-CM | POA: Diagnosis not present

## 2019-06-17 DIAGNOSIS — E1122 Type 2 diabetes mellitus with diabetic chronic kidney disease: Secondary | ICD-10-CM | POA: Diagnosis not present

## 2019-06-17 DIAGNOSIS — I130 Hypertensive heart and chronic kidney disease with heart failure and stage 1 through stage 4 chronic kidney disease, or unspecified chronic kidney disease: Secondary | ICD-10-CM | POA: Diagnosis not present

## 2019-06-17 DIAGNOSIS — G4733 Obstructive sleep apnea (adult) (pediatric): Secondary | ICD-10-CM | POA: Diagnosis not present

## 2019-06-17 DIAGNOSIS — M170 Bilateral primary osteoarthritis of knee: Secondary | ICD-10-CM | POA: Diagnosis not present

## 2019-06-17 DIAGNOSIS — I959 Hypotension, unspecified: Secondary | ICD-10-CM | POA: Diagnosis not present

## 2019-06-17 DIAGNOSIS — R319 Hematuria, unspecified: Secondary | ICD-10-CM | POA: Diagnosis not present

## 2019-06-17 DIAGNOSIS — N390 Urinary tract infection, site not specified: Secondary | ICD-10-CM | POA: Diagnosis not present

## 2019-06-17 DIAGNOSIS — K219 Gastro-esophageal reflux disease without esophagitis: Secondary | ICD-10-CM | POA: Diagnosis not present

## 2019-06-17 DIAGNOSIS — F418 Other specified anxiety disorders: Secondary | ICD-10-CM | POA: Diagnosis not present

## 2019-06-17 DIAGNOSIS — R531 Weakness: Secondary | ICD-10-CM | POA: Diagnosis not present

## 2019-06-17 DIAGNOSIS — F319 Bipolar disorder, unspecified: Secondary | ICD-10-CM | POA: Diagnosis not present

## 2019-06-17 DIAGNOSIS — N183 Chronic kidney disease, stage 3 unspecified: Secondary | ICD-10-CM | POA: Diagnosis not present

## 2019-06-18 DIAGNOSIS — I959 Hypotension, unspecified: Secondary | ICD-10-CM | POA: Diagnosis not present

## 2019-06-18 DIAGNOSIS — E1122 Type 2 diabetes mellitus with diabetic chronic kidney disease: Secondary | ICD-10-CM | POA: Diagnosis not present

## 2019-06-18 DIAGNOSIS — B961 Klebsiella pneumoniae [K. pneumoniae] as the cause of diseases classified elsewhere: Secondary | ICD-10-CM | POA: Diagnosis not present

## 2019-06-18 DIAGNOSIS — I509 Heart failure, unspecified: Secondary | ICD-10-CM | POA: Diagnosis not present

## 2019-06-18 DIAGNOSIS — G4733 Obstructive sleep apnea (adult) (pediatric): Secondary | ICD-10-CM | POA: Diagnosis not present

## 2019-06-18 DIAGNOSIS — E1142 Type 2 diabetes mellitus with diabetic polyneuropathy: Secondary | ICD-10-CM | POA: Diagnosis not present

## 2019-06-18 DIAGNOSIS — I130 Hypertensive heart and chronic kidney disease with heart failure and stage 1 through stage 4 chronic kidney disease, or unspecified chronic kidney disease: Secondary | ICD-10-CM | POA: Diagnosis not present

## 2019-06-18 DIAGNOSIS — R319 Hematuria, unspecified: Secondary | ICD-10-CM | POA: Diagnosis not present

## 2019-06-18 DIAGNOSIS — R531 Weakness: Secondary | ICD-10-CM | POA: Diagnosis not present

## 2019-06-18 DIAGNOSIS — N183 Chronic kidney disease, stage 3 unspecified: Secondary | ICD-10-CM | POA: Diagnosis not present

## 2019-06-18 DIAGNOSIS — M170 Bilateral primary osteoarthritis of knee: Secondary | ICD-10-CM | POA: Diagnosis not present

## 2019-06-18 DIAGNOSIS — N390 Urinary tract infection, site not specified: Secondary | ICD-10-CM | POA: Diagnosis not present

## 2019-06-18 DIAGNOSIS — R5381 Other malaise: Secondary | ICD-10-CM | POA: Diagnosis not present

## 2019-06-18 DIAGNOSIS — K219 Gastro-esophageal reflux disease without esophagitis: Secondary | ICD-10-CM | POA: Diagnosis not present

## 2019-06-18 DIAGNOSIS — F418 Other specified anxiety disorders: Secondary | ICD-10-CM | POA: Diagnosis not present

## 2019-06-18 DIAGNOSIS — F319 Bipolar disorder, unspecified: Secondary | ICD-10-CM | POA: Diagnosis not present

## 2019-06-19 DIAGNOSIS — K219 Gastro-esophageal reflux disease without esophagitis: Secondary | ICD-10-CM | POA: Diagnosis not present

## 2019-06-19 DIAGNOSIS — E1142 Type 2 diabetes mellitus with diabetic polyneuropathy: Secondary | ICD-10-CM | POA: Diagnosis not present

## 2019-06-19 DIAGNOSIS — I130 Hypertensive heart and chronic kidney disease with heart failure and stage 1 through stage 4 chronic kidney disease, or unspecified chronic kidney disease: Secondary | ICD-10-CM | POA: Diagnosis not present

## 2019-06-19 DIAGNOSIS — B961 Klebsiella pneumoniae [K. pneumoniae] as the cause of diseases classified elsewhere: Secondary | ICD-10-CM | POA: Diagnosis not present

## 2019-06-19 DIAGNOSIS — N390 Urinary tract infection, site not specified: Secondary | ICD-10-CM | POA: Diagnosis not present

## 2019-06-19 DIAGNOSIS — F319 Bipolar disorder, unspecified: Secondary | ICD-10-CM | POA: Diagnosis not present

## 2019-06-19 DIAGNOSIS — E1122 Type 2 diabetes mellitus with diabetic chronic kidney disease: Secondary | ICD-10-CM | POA: Diagnosis not present

## 2019-06-19 DIAGNOSIS — N183 Chronic kidney disease, stage 3 unspecified: Secondary | ICD-10-CM | POA: Diagnosis not present

## 2019-06-19 DIAGNOSIS — R531 Weakness: Secondary | ICD-10-CM | POA: Diagnosis not present

## 2019-06-19 DIAGNOSIS — I959 Hypotension, unspecified: Secondary | ICD-10-CM | POA: Diagnosis not present

## 2019-06-19 DIAGNOSIS — R5381 Other malaise: Secondary | ICD-10-CM | POA: Diagnosis not present

## 2019-06-19 DIAGNOSIS — G4733 Obstructive sleep apnea (adult) (pediatric): Secondary | ICD-10-CM | POA: Diagnosis not present

## 2019-06-19 DIAGNOSIS — R319 Hematuria, unspecified: Secondary | ICD-10-CM | POA: Diagnosis not present

## 2019-06-19 DIAGNOSIS — I509 Heart failure, unspecified: Secondary | ICD-10-CM | POA: Diagnosis not present

## 2019-06-19 DIAGNOSIS — M170 Bilateral primary osteoarthritis of knee: Secondary | ICD-10-CM | POA: Diagnosis not present

## 2019-06-19 DIAGNOSIS — F418 Other specified anxiety disorders: Secondary | ICD-10-CM | POA: Diagnosis not present

## 2019-06-20 DIAGNOSIS — N183 Chronic kidney disease, stage 3 unspecified: Secondary | ICD-10-CM | POA: Diagnosis not present

## 2019-06-20 DIAGNOSIS — G4733 Obstructive sleep apnea (adult) (pediatric): Secondary | ICD-10-CM | POA: Diagnosis not present

## 2019-06-20 DIAGNOSIS — K219 Gastro-esophageal reflux disease without esophagitis: Secondary | ICD-10-CM | POA: Diagnosis not present

## 2019-06-20 DIAGNOSIS — N390 Urinary tract infection, site not specified: Secondary | ICD-10-CM | POA: Diagnosis not present

## 2019-06-20 DIAGNOSIS — E1142 Type 2 diabetes mellitus with diabetic polyneuropathy: Secondary | ICD-10-CM | POA: Diagnosis not present

## 2019-06-20 DIAGNOSIS — R5381 Other malaise: Secondary | ICD-10-CM | POA: Diagnosis not present

## 2019-06-20 DIAGNOSIS — B961 Klebsiella pneumoniae [K. pneumoniae] as the cause of diseases classified elsewhere: Secondary | ICD-10-CM | POA: Diagnosis not present

## 2019-06-20 DIAGNOSIS — E1122 Type 2 diabetes mellitus with diabetic chronic kidney disease: Secondary | ICD-10-CM | POA: Diagnosis not present

## 2019-06-20 DIAGNOSIS — I509 Heart failure, unspecified: Secondary | ICD-10-CM | POA: Diagnosis not present

## 2019-06-20 DIAGNOSIS — R531 Weakness: Secondary | ICD-10-CM | POA: Diagnosis not present

## 2019-06-20 DIAGNOSIS — I959 Hypotension, unspecified: Secondary | ICD-10-CM | POA: Diagnosis not present

## 2019-06-20 DIAGNOSIS — M170 Bilateral primary osteoarthritis of knee: Secondary | ICD-10-CM | POA: Diagnosis not present

## 2019-06-20 DIAGNOSIS — F319 Bipolar disorder, unspecified: Secondary | ICD-10-CM | POA: Diagnosis not present

## 2019-06-20 DIAGNOSIS — I130 Hypertensive heart and chronic kidney disease with heart failure and stage 1 through stage 4 chronic kidney disease, or unspecified chronic kidney disease: Secondary | ICD-10-CM | POA: Diagnosis not present

## 2019-06-20 DIAGNOSIS — R319 Hematuria, unspecified: Secondary | ICD-10-CM | POA: Diagnosis not present

## 2019-06-20 DIAGNOSIS — F418 Other specified anxiety disorders: Secondary | ICD-10-CM | POA: Diagnosis not present

## 2019-06-21 DIAGNOSIS — B961 Klebsiella pneumoniae [K. pneumoniae] as the cause of diseases classified elsewhere: Secondary | ICD-10-CM | POA: Diagnosis not present

## 2019-06-21 DIAGNOSIS — R5381 Other malaise: Secondary | ICD-10-CM | POA: Diagnosis not present

## 2019-06-21 DIAGNOSIS — E1142 Type 2 diabetes mellitus with diabetic polyneuropathy: Secondary | ICD-10-CM | POA: Diagnosis not present

## 2019-06-21 DIAGNOSIS — F418 Other specified anxiety disorders: Secondary | ICD-10-CM | POA: Diagnosis not present

## 2019-06-21 DIAGNOSIS — I130 Hypertensive heart and chronic kidney disease with heart failure and stage 1 through stage 4 chronic kidney disease, or unspecified chronic kidney disease: Secondary | ICD-10-CM | POA: Diagnosis not present

## 2019-06-21 DIAGNOSIS — N390 Urinary tract infection, site not specified: Secondary | ICD-10-CM | POA: Diagnosis not present

## 2019-06-21 DIAGNOSIS — I509 Heart failure, unspecified: Secondary | ICD-10-CM | POA: Diagnosis not present

## 2019-06-21 DIAGNOSIS — I959 Hypotension, unspecified: Secondary | ICD-10-CM | POA: Diagnosis not present

## 2019-06-21 DIAGNOSIS — R319 Hematuria, unspecified: Secondary | ICD-10-CM | POA: Diagnosis not present

## 2019-06-21 DIAGNOSIS — N183 Chronic kidney disease, stage 3 unspecified: Secondary | ICD-10-CM | POA: Diagnosis not present

## 2019-06-21 DIAGNOSIS — K219 Gastro-esophageal reflux disease without esophagitis: Secondary | ICD-10-CM | POA: Diagnosis not present

## 2019-06-21 DIAGNOSIS — E1122 Type 2 diabetes mellitus with diabetic chronic kidney disease: Secondary | ICD-10-CM | POA: Diagnosis not present

## 2019-06-21 DIAGNOSIS — G4733 Obstructive sleep apnea (adult) (pediatric): Secondary | ICD-10-CM | POA: Diagnosis not present

## 2019-06-21 DIAGNOSIS — M170 Bilateral primary osteoarthritis of knee: Secondary | ICD-10-CM | POA: Diagnosis not present

## 2019-06-21 DIAGNOSIS — R531 Weakness: Secondary | ICD-10-CM | POA: Diagnosis not present

## 2019-06-21 DIAGNOSIS — F319 Bipolar disorder, unspecified: Secondary | ICD-10-CM | POA: Diagnosis not present

## 2019-06-22 DIAGNOSIS — I130 Hypertensive heart and chronic kidney disease with heart failure and stage 1 through stage 4 chronic kidney disease, or unspecified chronic kidney disease: Secondary | ICD-10-CM | POA: Diagnosis not present

## 2019-06-22 DIAGNOSIS — F319 Bipolar disorder, unspecified: Secondary | ICD-10-CM | POA: Diagnosis not present

## 2019-06-22 DIAGNOSIS — G4733 Obstructive sleep apnea (adult) (pediatric): Secondary | ICD-10-CM | POA: Diagnosis not present

## 2019-06-22 DIAGNOSIS — R531 Weakness: Secondary | ICD-10-CM | POA: Diagnosis not present

## 2019-06-22 DIAGNOSIS — R319 Hematuria, unspecified: Secondary | ICD-10-CM | POA: Diagnosis not present

## 2019-06-22 DIAGNOSIS — B961 Klebsiella pneumoniae [K. pneumoniae] as the cause of diseases classified elsewhere: Secondary | ICD-10-CM | POA: Diagnosis not present

## 2019-06-22 DIAGNOSIS — E1142 Type 2 diabetes mellitus with diabetic polyneuropathy: Secondary | ICD-10-CM | POA: Diagnosis not present

## 2019-06-22 DIAGNOSIS — E1122 Type 2 diabetes mellitus with diabetic chronic kidney disease: Secondary | ICD-10-CM | POA: Diagnosis not present

## 2019-06-22 DIAGNOSIS — I509 Heart failure, unspecified: Secondary | ICD-10-CM | POA: Diagnosis not present

## 2019-06-22 DIAGNOSIS — F418 Other specified anxiety disorders: Secondary | ICD-10-CM | POA: Diagnosis not present

## 2019-06-22 DIAGNOSIS — K219 Gastro-esophageal reflux disease without esophagitis: Secondary | ICD-10-CM | POA: Diagnosis not present

## 2019-06-22 DIAGNOSIS — M170 Bilateral primary osteoarthritis of knee: Secondary | ICD-10-CM | POA: Diagnosis not present

## 2019-06-22 DIAGNOSIS — N183 Chronic kidney disease, stage 3 unspecified: Secondary | ICD-10-CM | POA: Diagnosis not present

## 2019-06-22 DIAGNOSIS — I959 Hypotension, unspecified: Secondary | ICD-10-CM | POA: Diagnosis not present

## 2019-06-22 DIAGNOSIS — R5381 Other malaise: Secondary | ICD-10-CM | POA: Diagnosis not present

## 2019-06-22 DIAGNOSIS — N390 Urinary tract infection, site not specified: Secondary | ICD-10-CM | POA: Diagnosis not present

## 2019-06-23 DIAGNOSIS — M170 Bilateral primary osteoarthritis of knee: Secondary | ICD-10-CM | POA: Diagnosis not present

## 2019-06-23 DIAGNOSIS — E1122 Type 2 diabetes mellitus with diabetic chronic kidney disease: Secondary | ICD-10-CM | POA: Diagnosis not present

## 2019-06-23 DIAGNOSIS — I130 Hypertensive heart and chronic kidney disease with heart failure and stage 1 through stage 4 chronic kidney disease, or unspecified chronic kidney disease: Secondary | ICD-10-CM | POA: Diagnosis not present

## 2019-06-23 DIAGNOSIS — K219 Gastro-esophageal reflux disease without esophagitis: Secondary | ICD-10-CM | POA: Diagnosis not present

## 2019-06-23 DIAGNOSIS — E1142 Type 2 diabetes mellitus with diabetic polyneuropathy: Secondary | ICD-10-CM | POA: Diagnosis not present

## 2019-06-23 DIAGNOSIS — I959 Hypotension, unspecified: Secondary | ICD-10-CM | POA: Diagnosis not present

## 2019-06-23 DIAGNOSIS — I509 Heart failure, unspecified: Secondary | ICD-10-CM | POA: Diagnosis not present

## 2019-06-23 DIAGNOSIS — G4733 Obstructive sleep apnea (adult) (pediatric): Secondary | ICD-10-CM | POA: Diagnosis not present

## 2019-06-23 DIAGNOSIS — B961 Klebsiella pneumoniae [K. pneumoniae] as the cause of diseases classified elsewhere: Secondary | ICD-10-CM | POA: Diagnosis not present

## 2019-06-23 DIAGNOSIS — N183 Chronic kidney disease, stage 3 unspecified: Secondary | ICD-10-CM | POA: Diagnosis not present

## 2019-06-23 DIAGNOSIS — F418 Other specified anxiety disorders: Secondary | ICD-10-CM | POA: Diagnosis not present

## 2019-06-23 DIAGNOSIS — R319 Hematuria, unspecified: Secondary | ICD-10-CM | POA: Diagnosis not present

## 2019-06-23 DIAGNOSIS — R5381 Other malaise: Secondary | ICD-10-CM | POA: Diagnosis not present

## 2019-06-23 DIAGNOSIS — N390 Urinary tract infection, site not specified: Secondary | ICD-10-CM | POA: Diagnosis not present

## 2019-06-23 DIAGNOSIS — F319 Bipolar disorder, unspecified: Secondary | ICD-10-CM | POA: Diagnosis not present

## 2019-06-23 DIAGNOSIS — R531 Weakness: Secondary | ICD-10-CM | POA: Diagnosis not present

## 2019-06-24 DIAGNOSIS — N183 Chronic kidney disease, stage 3 unspecified: Secondary | ICD-10-CM | POA: Diagnosis not present

## 2019-06-24 DIAGNOSIS — N390 Urinary tract infection, site not specified: Secondary | ICD-10-CM | POA: Diagnosis not present

## 2019-06-24 DIAGNOSIS — K219 Gastro-esophageal reflux disease without esophagitis: Secondary | ICD-10-CM | POA: Diagnosis not present

## 2019-06-24 DIAGNOSIS — F418 Other specified anxiety disorders: Secondary | ICD-10-CM | POA: Diagnosis not present

## 2019-06-24 DIAGNOSIS — I130 Hypertensive heart and chronic kidney disease with heart failure and stage 1 through stage 4 chronic kidney disease, or unspecified chronic kidney disease: Secondary | ICD-10-CM | POA: Diagnosis not present

## 2019-06-24 DIAGNOSIS — M170 Bilateral primary osteoarthritis of knee: Secondary | ICD-10-CM | POA: Diagnosis not present

## 2019-06-24 DIAGNOSIS — E1122 Type 2 diabetes mellitus with diabetic chronic kidney disease: Secondary | ICD-10-CM | POA: Diagnosis not present

## 2019-06-24 DIAGNOSIS — R5381 Other malaise: Secondary | ICD-10-CM | POA: Diagnosis not present

## 2019-06-24 DIAGNOSIS — R531 Weakness: Secondary | ICD-10-CM | POA: Diagnosis not present

## 2019-06-24 DIAGNOSIS — G4733 Obstructive sleep apnea (adult) (pediatric): Secondary | ICD-10-CM | POA: Diagnosis not present

## 2019-06-24 DIAGNOSIS — I959 Hypotension, unspecified: Secondary | ICD-10-CM | POA: Diagnosis not present

## 2019-06-24 DIAGNOSIS — F319 Bipolar disorder, unspecified: Secondary | ICD-10-CM | POA: Diagnosis not present

## 2019-06-24 DIAGNOSIS — I509 Heart failure, unspecified: Secondary | ICD-10-CM | POA: Diagnosis not present

## 2019-06-24 DIAGNOSIS — R319 Hematuria, unspecified: Secondary | ICD-10-CM | POA: Diagnosis not present

## 2019-06-24 DIAGNOSIS — E1142 Type 2 diabetes mellitus with diabetic polyneuropathy: Secondary | ICD-10-CM | POA: Diagnosis not present

## 2019-06-24 DIAGNOSIS — B961 Klebsiella pneumoniae [K. pneumoniae] as the cause of diseases classified elsewhere: Secondary | ICD-10-CM | POA: Diagnosis not present

## 2019-06-25 DIAGNOSIS — F418 Other specified anxiety disorders: Secondary | ICD-10-CM | POA: Diagnosis not present

## 2019-06-25 DIAGNOSIS — I959 Hypotension, unspecified: Secondary | ICD-10-CM | POA: Diagnosis not present

## 2019-06-25 DIAGNOSIS — I509 Heart failure, unspecified: Secondary | ICD-10-CM | POA: Diagnosis not present

## 2019-06-25 DIAGNOSIS — F319 Bipolar disorder, unspecified: Secondary | ICD-10-CM | POA: Diagnosis not present

## 2019-06-25 DIAGNOSIS — E1122 Type 2 diabetes mellitus with diabetic chronic kidney disease: Secondary | ICD-10-CM | POA: Diagnosis not present

## 2019-06-25 DIAGNOSIS — K219 Gastro-esophageal reflux disease without esophagitis: Secondary | ICD-10-CM | POA: Diagnosis not present

## 2019-06-25 DIAGNOSIS — E1142 Type 2 diabetes mellitus with diabetic polyneuropathy: Secondary | ICD-10-CM | POA: Diagnosis not present

## 2019-06-25 DIAGNOSIS — R531 Weakness: Secondary | ICD-10-CM | POA: Diagnosis not present

## 2019-06-25 DIAGNOSIS — N183 Chronic kidney disease, stage 3 unspecified: Secondary | ICD-10-CM | POA: Diagnosis not present

## 2019-06-25 DIAGNOSIS — G4733 Obstructive sleep apnea (adult) (pediatric): Secondary | ICD-10-CM | POA: Diagnosis not present

## 2019-06-25 DIAGNOSIS — I130 Hypertensive heart and chronic kidney disease with heart failure and stage 1 through stage 4 chronic kidney disease, or unspecified chronic kidney disease: Secondary | ICD-10-CM | POA: Diagnosis not present

## 2019-06-25 DIAGNOSIS — R5381 Other malaise: Secondary | ICD-10-CM | POA: Diagnosis not present

## 2019-06-25 DIAGNOSIS — N390 Urinary tract infection, site not specified: Secondary | ICD-10-CM | POA: Diagnosis not present

## 2019-06-25 DIAGNOSIS — B961 Klebsiella pneumoniae [K. pneumoniae] as the cause of diseases classified elsewhere: Secondary | ICD-10-CM | POA: Diagnosis not present

## 2019-06-25 DIAGNOSIS — R319 Hematuria, unspecified: Secondary | ICD-10-CM | POA: Diagnosis not present

## 2019-06-25 DIAGNOSIS — M170 Bilateral primary osteoarthritis of knee: Secondary | ICD-10-CM | POA: Diagnosis not present

## 2019-06-26 DIAGNOSIS — G4733 Obstructive sleep apnea (adult) (pediatric): Secondary | ICD-10-CM | POA: Diagnosis not present

## 2019-06-26 DIAGNOSIS — N390 Urinary tract infection, site not specified: Secondary | ICD-10-CM | POA: Diagnosis not present

## 2019-06-26 DIAGNOSIS — F418 Other specified anxiety disorders: Secondary | ICD-10-CM | POA: Diagnosis not present

## 2019-06-26 DIAGNOSIS — F319 Bipolar disorder, unspecified: Secondary | ICD-10-CM | POA: Diagnosis not present

## 2019-06-26 DIAGNOSIS — M170 Bilateral primary osteoarthritis of knee: Secondary | ICD-10-CM | POA: Diagnosis not present

## 2019-06-26 DIAGNOSIS — I509 Heart failure, unspecified: Secondary | ICD-10-CM | POA: Diagnosis not present

## 2019-06-26 DIAGNOSIS — R5381 Other malaise: Secondary | ICD-10-CM | POA: Diagnosis not present

## 2019-06-26 DIAGNOSIS — B961 Klebsiella pneumoniae [K. pneumoniae] as the cause of diseases classified elsewhere: Secondary | ICD-10-CM | POA: Diagnosis not present

## 2019-06-26 DIAGNOSIS — N183 Chronic kidney disease, stage 3 unspecified: Secondary | ICD-10-CM | POA: Diagnosis not present

## 2019-06-26 DIAGNOSIS — R531 Weakness: Secondary | ICD-10-CM | POA: Diagnosis not present

## 2019-06-26 DIAGNOSIS — K219 Gastro-esophageal reflux disease without esophagitis: Secondary | ICD-10-CM | POA: Diagnosis not present

## 2019-06-26 DIAGNOSIS — E1122 Type 2 diabetes mellitus with diabetic chronic kidney disease: Secondary | ICD-10-CM | POA: Diagnosis not present

## 2019-06-26 DIAGNOSIS — E1142 Type 2 diabetes mellitus with diabetic polyneuropathy: Secondary | ICD-10-CM | POA: Diagnosis not present

## 2019-06-26 DIAGNOSIS — I959 Hypotension, unspecified: Secondary | ICD-10-CM | POA: Diagnosis not present

## 2019-06-26 DIAGNOSIS — I130 Hypertensive heart and chronic kidney disease with heart failure and stage 1 through stage 4 chronic kidney disease, or unspecified chronic kidney disease: Secondary | ICD-10-CM | POA: Diagnosis not present

## 2019-06-26 DIAGNOSIS — R319 Hematuria, unspecified: Secondary | ICD-10-CM | POA: Diagnosis not present

## 2019-06-27 DIAGNOSIS — I130 Hypertensive heart and chronic kidney disease with heart failure and stage 1 through stage 4 chronic kidney disease, or unspecified chronic kidney disease: Secondary | ICD-10-CM | POA: Diagnosis not present

## 2019-06-27 DIAGNOSIS — N390 Urinary tract infection, site not specified: Secondary | ICD-10-CM | POA: Diagnosis not present

## 2019-06-27 DIAGNOSIS — R531 Weakness: Secondary | ICD-10-CM | POA: Diagnosis not present

## 2019-06-27 DIAGNOSIS — I509 Heart failure, unspecified: Secondary | ICD-10-CM | POA: Diagnosis not present

## 2019-06-27 DIAGNOSIS — R5381 Other malaise: Secondary | ICD-10-CM | POA: Diagnosis not present

## 2019-06-27 DIAGNOSIS — K219 Gastro-esophageal reflux disease without esophagitis: Secondary | ICD-10-CM | POA: Diagnosis not present

## 2019-06-27 DIAGNOSIS — N183 Chronic kidney disease, stage 3 unspecified: Secondary | ICD-10-CM | POA: Diagnosis not present

## 2019-06-27 DIAGNOSIS — E1122 Type 2 diabetes mellitus with diabetic chronic kidney disease: Secondary | ICD-10-CM | POA: Diagnosis not present

## 2019-06-27 DIAGNOSIS — E1142 Type 2 diabetes mellitus with diabetic polyneuropathy: Secondary | ICD-10-CM | POA: Diagnosis not present

## 2019-06-27 DIAGNOSIS — M170 Bilateral primary osteoarthritis of knee: Secondary | ICD-10-CM | POA: Diagnosis not present

## 2019-06-27 DIAGNOSIS — B961 Klebsiella pneumoniae [K. pneumoniae] as the cause of diseases classified elsewhere: Secondary | ICD-10-CM | POA: Diagnosis not present

## 2019-06-27 DIAGNOSIS — R319 Hematuria, unspecified: Secondary | ICD-10-CM | POA: Diagnosis not present

## 2019-06-27 DIAGNOSIS — F418 Other specified anxiety disorders: Secondary | ICD-10-CM | POA: Diagnosis not present

## 2019-06-27 DIAGNOSIS — G4733 Obstructive sleep apnea (adult) (pediatric): Secondary | ICD-10-CM | POA: Diagnosis not present

## 2019-06-27 DIAGNOSIS — I959 Hypotension, unspecified: Secondary | ICD-10-CM | POA: Diagnosis not present

## 2019-06-27 DIAGNOSIS — F319 Bipolar disorder, unspecified: Secondary | ICD-10-CM | POA: Diagnosis not present

## 2019-06-28 DIAGNOSIS — N390 Urinary tract infection, site not specified: Secondary | ICD-10-CM | POA: Diagnosis not present

## 2019-06-28 DIAGNOSIS — G4733 Obstructive sleep apnea (adult) (pediatric): Secondary | ICD-10-CM | POA: Diagnosis not present

## 2019-06-28 DIAGNOSIS — E1122 Type 2 diabetes mellitus with diabetic chronic kidney disease: Secondary | ICD-10-CM | POA: Diagnosis not present

## 2019-06-28 DIAGNOSIS — R531 Weakness: Secondary | ICD-10-CM | POA: Diagnosis not present

## 2019-06-28 DIAGNOSIS — F319 Bipolar disorder, unspecified: Secondary | ICD-10-CM | POA: Diagnosis not present

## 2019-06-28 DIAGNOSIS — M170 Bilateral primary osteoarthritis of knee: Secondary | ICD-10-CM | POA: Diagnosis not present

## 2019-06-28 DIAGNOSIS — I509 Heart failure, unspecified: Secondary | ICD-10-CM | POA: Diagnosis not present

## 2019-06-28 DIAGNOSIS — E1142 Type 2 diabetes mellitus with diabetic polyneuropathy: Secondary | ICD-10-CM | POA: Diagnosis not present

## 2019-06-28 DIAGNOSIS — F418 Other specified anxiety disorders: Secondary | ICD-10-CM | POA: Diagnosis not present

## 2019-06-28 DIAGNOSIS — K219 Gastro-esophageal reflux disease without esophagitis: Secondary | ICD-10-CM | POA: Diagnosis not present

## 2019-06-28 DIAGNOSIS — N183 Chronic kidney disease, stage 3 unspecified: Secondary | ICD-10-CM | POA: Diagnosis not present

## 2019-06-28 DIAGNOSIS — B961 Klebsiella pneumoniae [K. pneumoniae] as the cause of diseases classified elsewhere: Secondary | ICD-10-CM | POA: Diagnosis not present

## 2019-06-28 DIAGNOSIS — I959 Hypotension, unspecified: Secondary | ICD-10-CM | POA: Diagnosis not present

## 2019-06-28 DIAGNOSIS — I130 Hypertensive heart and chronic kidney disease with heart failure and stage 1 through stage 4 chronic kidney disease, or unspecified chronic kidney disease: Secondary | ICD-10-CM | POA: Diagnosis not present

## 2019-06-28 DIAGNOSIS — R5381 Other malaise: Secondary | ICD-10-CM | POA: Diagnosis not present

## 2019-06-28 DIAGNOSIS — R319 Hematuria, unspecified: Secondary | ICD-10-CM | POA: Diagnosis not present

## 2019-06-29 DIAGNOSIS — K219 Gastro-esophageal reflux disease without esophagitis: Secondary | ICD-10-CM | POA: Diagnosis not present

## 2019-06-29 DIAGNOSIS — G4733 Obstructive sleep apnea (adult) (pediatric): Secondary | ICD-10-CM | POA: Diagnosis not present

## 2019-06-29 DIAGNOSIS — M170 Bilateral primary osteoarthritis of knee: Secondary | ICD-10-CM | POA: Diagnosis not present

## 2019-06-29 DIAGNOSIS — R5381 Other malaise: Secondary | ICD-10-CM | POA: Diagnosis not present

## 2019-06-29 DIAGNOSIS — I509 Heart failure, unspecified: Secondary | ICD-10-CM | POA: Diagnosis not present

## 2019-06-29 DIAGNOSIS — E1122 Type 2 diabetes mellitus with diabetic chronic kidney disease: Secondary | ICD-10-CM | POA: Diagnosis not present

## 2019-06-29 DIAGNOSIS — F418 Other specified anxiety disorders: Secondary | ICD-10-CM | POA: Diagnosis not present

## 2019-06-29 DIAGNOSIS — I959 Hypotension, unspecified: Secondary | ICD-10-CM | POA: Diagnosis not present

## 2019-06-29 DIAGNOSIS — R319 Hematuria, unspecified: Secondary | ICD-10-CM | POA: Diagnosis not present

## 2019-06-29 DIAGNOSIS — F319 Bipolar disorder, unspecified: Secondary | ICD-10-CM | POA: Diagnosis not present

## 2019-06-29 DIAGNOSIS — N183 Chronic kidney disease, stage 3 unspecified: Secondary | ICD-10-CM | POA: Diagnosis not present

## 2019-06-29 DIAGNOSIS — R531 Weakness: Secondary | ICD-10-CM | POA: Diagnosis not present

## 2019-06-29 DIAGNOSIS — I130 Hypertensive heart and chronic kidney disease with heart failure and stage 1 through stage 4 chronic kidney disease, or unspecified chronic kidney disease: Secondary | ICD-10-CM | POA: Diagnosis not present

## 2019-06-29 DIAGNOSIS — N390 Urinary tract infection, site not specified: Secondary | ICD-10-CM | POA: Diagnosis not present

## 2019-06-29 DIAGNOSIS — E1142 Type 2 diabetes mellitus with diabetic polyneuropathy: Secondary | ICD-10-CM | POA: Diagnosis not present

## 2019-06-29 DIAGNOSIS — B961 Klebsiella pneumoniae [K. pneumoniae] as the cause of diseases classified elsewhere: Secondary | ICD-10-CM | POA: Diagnosis not present

## 2019-06-30 DIAGNOSIS — N183 Chronic kidney disease, stage 3 unspecified: Secondary | ICD-10-CM | POA: Diagnosis not present

## 2019-06-30 DIAGNOSIS — G4733 Obstructive sleep apnea (adult) (pediatric): Secondary | ICD-10-CM | POA: Diagnosis not present

## 2019-06-30 DIAGNOSIS — E1151 Type 2 diabetes mellitus with diabetic peripheral angiopathy without gangrene: Secondary | ICD-10-CM | POA: Diagnosis not present

## 2019-06-30 DIAGNOSIS — I872 Venous insufficiency (chronic) (peripheral): Secondary | ICD-10-CM | POA: Diagnosis not present

## 2019-06-30 DIAGNOSIS — F4320 Adjustment disorder, unspecified: Secondary | ICD-10-CM | POA: Diagnosis not present

## 2019-06-30 DIAGNOSIS — R339 Retention of urine, unspecified: Secondary | ICD-10-CM | POA: Diagnosis not present

## 2019-06-30 DIAGNOSIS — I509 Heart failure, unspecified: Secondary | ICD-10-CM | POA: Diagnosis not present

## 2019-06-30 DIAGNOSIS — F419 Anxiety disorder, unspecified: Secondary | ICD-10-CM | POA: Diagnosis not present

## 2019-06-30 DIAGNOSIS — I130 Hypertensive heart and chronic kidney disease with heart failure and stage 1 through stage 4 chronic kidney disease, or unspecified chronic kidney disease: Secondary | ICD-10-CM | POA: Diagnosis not present

## 2019-06-30 DIAGNOSIS — E669 Obesity, unspecified: Secondary | ICD-10-CM | POA: Diagnosis not present

## 2019-06-30 DIAGNOSIS — N3000 Acute cystitis without hematuria: Secondary | ICD-10-CM | POA: Diagnosis not present

## 2019-06-30 DIAGNOSIS — M1990 Unspecified osteoarthritis, unspecified site: Secondary | ICD-10-CM | POA: Diagnosis not present

## 2019-06-30 DIAGNOSIS — J449 Chronic obstructive pulmonary disease, unspecified: Secondary | ICD-10-CM | POA: Diagnosis not present

## 2019-06-30 DIAGNOSIS — E1165 Type 2 diabetes mellitus with hyperglycemia: Secondary | ICD-10-CM | POA: Diagnosis not present

## 2019-06-30 DIAGNOSIS — E1122 Type 2 diabetes mellitus with diabetic chronic kidney disease: Secondary | ICD-10-CM | POA: Diagnosis not present

## 2019-06-30 DIAGNOSIS — F319 Bipolar disorder, unspecified: Secondary | ICD-10-CM | POA: Diagnosis not present

## 2019-07-05 DIAGNOSIS — R5381 Other malaise: Secondary | ICD-10-CM | POA: Diagnosis not present

## 2019-07-05 DIAGNOSIS — I509 Heart failure, unspecified: Secondary | ICD-10-CM | POA: Diagnosis not present

## 2019-07-05 DIAGNOSIS — I10 Essential (primary) hypertension: Secondary | ICD-10-CM | POA: Diagnosis not present

## 2019-07-05 DIAGNOSIS — E1140 Type 2 diabetes mellitus with diabetic neuropathy, unspecified: Secondary | ICD-10-CM | POA: Diagnosis not present

## 2019-07-06 DIAGNOSIS — I509 Heart failure, unspecified: Secondary | ICD-10-CM | POA: Diagnosis not present

## 2019-07-06 DIAGNOSIS — J449 Chronic obstructive pulmonary disease, unspecified: Secondary | ICD-10-CM | POA: Diagnosis not present

## 2019-07-19 ENCOUNTER — Ambulatory Visit: Payer: Medicare Other | Admitting: Cardiology

## 2019-07-19 ENCOUNTER — Other Ambulatory Visit: Payer: Self-pay

## 2019-07-19 ENCOUNTER — Ambulatory Visit (INDEPENDENT_AMBULATORY_CARE_PROVIDER_SITE_OTHER): Payer: Medicare Other

## 2019-07-19 ENCOUNTER — Encounter: Payer: Self-pay | Admitting: *Deleted

## 2019-07-19 VITALS — BP 120/62 | HR 104 | Ht 62.0 in | Wt 281.0 lb

## 2019-07-19 DIAGNOSIS — R42 Dizziness and giddiness: Secondary | ICD-10-CM | POA: Diagnosis not present

## 2019-07-19 DIAGNOSIS — I10 Essential (primary) hypertension: Secondary | ICD-10-CM | POA: Diagnosis not present

## 2019-07-19 DIAGNOSIS — J9610 Chronic respiratory failure, unspecified whether with hypoxia or hypercapnia: Secondary | ICD-10-CM

## 2019-07-19 DIAGNOSIS — I447 Left bundle-branch block, unspecified: Secondary | ICD-10-CM | POA: Diagnosis not present

## 2019-07-19 DIAGNOSIS — R0602 Shortness of breath: Secondary | ICD-10-CM

## 2019-07-19 MED ORDER — FUROSEMIDE 20 MG PO TABS: ORAL_TABLET | ORAL | 3 refills | Status: DC

## 2019-07-19 NOTE — Patient Instructions (Signed)
Medication Instructions:  Your physician has recommended you make the following change in your medication:   Take 20 mg Lasix on Monday, Wednesday, Friday and Sunday. Take 40 mg Lasix on Tuesday, Thursday and Saturday.  *If you need a refill on your cardiac medications before your next appointment, please call your pharmacy*   Lab Work: None ordered If you have labs (blood work) drawn today and your tests are completely normal, you will receive your results only by: Marland Kitchen MyChart Message (if you have MyChart) OR . A paper copy in the mail If you have any lab test that is abnormal or we need to change your treatment, we will call you to review the results.   Testing/Procedures: Your physician has requested that you have an echocardiogram. Echocardiography is a painless test that uses sound waves to create images of your heart. It provides your doctor with information about the size and shape of your heart and how well your heart's chambers and valves are working. This procedure takes approximately one hour. There are no restrictions for this procedure.   WHY IS MY DOCTOR PRESCRIBING ZIO? The Zio system is proven and trusted by physicians to detect and diagnose irregular heart rhythms -- and has been prescribed to hundreds of thousands of patients.  The FDA has cleared the Zio system to monitor for many different kinds of irregular heart rhythms. In a study, physicians were able to reach a diagnosis 90% of the time with the Zio system1.  You can wear the Zio monitor -- a small, discreet, comfortable patch -- during your normal day-to-day activity, including while you sleep, shower, and exercise, while it records every single heartbeat for analysis.  1Barrett, P., et al. Comparison of 24 Hour Holter Monitoring Versus 14 Day Novel Adhesive Patch Electrocardiographic Monitoring. [place], 2014.  ZIO VS. HOLTER MONITORING The Zio monitor can be comfortably worn for up to 14  days. Holter monitors can be worn for 24 to 48 hours, limiting the time to record any irregular heart rhythms you may have. Zio is able to capture data for the 51% of patients who have their first symptom-triggered arrhythmia after 48 hours.1  LIVE WITHOUT RESTRICTIONS The Zio ambulatory cardiac monitor is a small, unobtrusive, and water-resistant patch--you might even forget you're wearing it. The Zio monitor records and stores every beat of your heart, whether you're sleeping, working out, or showering.  Wear the monitor for 7 days, remove on 07/26/19.      Follow-Up: At [facility], you and your health needs are our priority.  As part of our continuing mission to provide you with exceptional heart care, we have created designated Provider Care Teams.  These Care Teams include your primary Cardiologist (physician) and Advanced Practice Providers (APPs -  Physician Assistants and Nurse Practitioners) who all work together to provide you with the care you need, when you need it.  We recommend signing up for the patient portal called "MyChart".  Sign up information is provided on this After Visit Summary.  MyChart is used to connect with patients for Virtual Visits (Telemedicine).  Patients are able to view lab/test results, encounter notes, upcoming appointments, etc.  Non-urgent messages can be sent to your provider as well.   To learn more about what you can do with MyChart, go to [email or web address].    Your next appointment:   1 month(s)  The format for your next appointment:   In Person  Provider:   Berniece Salines, DO  Other Instructions Furosemide Oral Tablets What is this medicine? FUROSEMIDE (fyoor [place] se mide) is a diuretic. It helps you make more urine and to lose salt and excess water from your body. It treats swelling from heart, kidney, or liver disease. It also treats high blood pressure. This medicine may be used for other purposes; ask your health care provider or  pharmacist if you have questions. COMMON BRAND NAME(S): Active-Medicated Specimen Kit, Delone, Diuscreen, Lasix, RX Specimen Collection Kit, Specimen Collection Kit, URINX Medicated Specimen Collection What should I tell my health care provider before I take this medicine? They need to know if you have any of these conditions:  abnormal blood electrolytes  diarrhea or vomiting  gout  heart disease  kidney disease, small amounts of urine, or difficulty passing urine  liver disease  thyroid disease  an unusual or allergic reaction to furosemide, sulfa drugs, other medicines, foods, dyes, or preservatives  pregnant or trying to get pregnant  breast-feeding How should I use this medicine? Take this drug by mouth. Take it as directed on the prescription label at the same time every day. You can take it with or without food. If it upsets your stomach, take it with food. Keep taking it unless your health care provider tells you to stop. Talk to your health care provider about the use of this drug in children. Special care may be needed. Overdosage: If you think you have taken too much of this medicine contact a poison control center or emergency room at once. NOTE: This medicine is only for you. Do not share this medicine with others. What if I miss a dose? If you miss a dose, take it as soon as you can. If it is almost time for your next dose, take only that dose. Do not take double or extra doses. What may interact with this medicine?  aspirin and aspirin-like medicines  certain antibiotics  chloral hydrate  cisplatin  cyclosporine  digoxin  diuretics  laxatives  lithium  medicines for blood pressure  medicines that relax muscles for surgery  methotrexate  NSAIDs, medicines for pain and inflammation like ibuprofen, naproxen, or indomethacin  phenytoin  steroid medicines like prednisone or cortisone  sucralfate  thyroid hormones This list may not describe  all possible interactions. Give your health care provider a list of all the medicines, herbs, non-prescription drugs, or dietary supplements you use. Also tell them if you smoke, drink alcohol, or use illegal drugs. Some items may interact with your medicine. What should I watch for while using this medicine? Visit your doctor or health care provider for regular checks on your progress. Check your blood pressure regularly. Ask your doctor or health care provider what your blood pressure should be, and when you should contact him or her. If you are a diabetic, check your blood sugar as directed. This medicine may cause serious skin reactions. They can happen weeks to months after starting the medicine. Contact your health care provider right away if you notice fevers or flu-like symptoms with a rash. The rash may be red or purple and then turn into blisters or peeling of the skin. Or, you might notice a red rash with swelling of the face, lips or lymph nodes in your neck or under your arms. You may need to be on a special diet while taking this medicine. Check with your doctor. Also, ask how many glasses of fluid you need to drink a day. You must not get dehydrated. You may  get drowsy or dizzy. Do not drive, use machinery, or do anything that needs mental alertness until you know how this drug affects you. Do not stand or sit up quickly, especially if you are an older patient. This reduces the risk of dizzy or fainting spells. Alcohol can make you more drowsy and dizzy. Avoid alcoholic drinks. This medicine can make you more sensitive to the sun. Keep out of the sun. If you cannot avoid being in the sun, wear protective clothing and use sunscreen. Do not use sun lamps or tanning beds/booths. What side effects may I notice from receiving this medicine? Side effects that you should report to your doctor or health care professional as soon as possible:  blood in urine or stools  dry mouth  fever or  chills  hearing loss or ringing in the ears  irregular heartbeat  muscle pain or weakness, cramps  rash, fever, and swollen lymph nodes  redness, blistering, peeling or loosening of the skin, including inside the mouth  skin rash  stomach upset, pain, or nausea  tingling or numbness in the hands or feet  unusually weak or tired  vomiting or diarrhea  yellowing of the eyes or skin Side effects that usually do not require medical attention (report to your doctor or health care professional if they continue or are bothersome):  headache  loss of appetite  unusual bleeding or bruising This list may not describe all possible side effects. Call your doctor for medical advice about side effects. You may report side effects to FDA at 1-800-FDA-1088. Where should I keep my medicine? Keep out of the reach of children and pets. Store at room temperature between 20 and 25 degrees C (68 and 77 degrees F). Protect from light and moisture. Keep the container tightly closed. Throw away any unused drug after the expiration date. NOTE: This sheet is a summary. It may not cover all possible information. If you have questions about this medicine, talk to your doctor, pharmacist, or health care provider.  2020 Elsevier/Gold Standard (2018-08-09 18:01:32)  Echocardiogram An echocardiogram is a procedure that uses painless sound waves (ultrasound) to produce an image of the heart. Images from an echocardiogram can provide important information about:  Signs of coronary artery disease (CAD).  Aneurysm detection. An aneurysm is a weak or damaged part of an artery wall that bulges out from the normal force of blood pumping through the body.  Heart size and shape. Changes in the size or shape of the heart can be associated with certain conditions, including heart failure, aneurysm, and CAD.  Heart muscle function.  Heart valve function.  Signs of a past heart attack.  Fluid buildup around  the heart.  Thickening of the heart muscle.  A tumor or infectious growth around the heart valves. Tell a health care provider about:  Any allergies you have.  All medicines you are taking, including vitamins, herbs, eye drops, creams, and over-the-counter medicines.  Any blood disorders you have.  Any surgeries you have had.  Any medical conditions you have.  Whether you are pregnant or may be pregnant. What are the risks? Generally, this is a safe procedure. However, problems may occur, including:  Allergic reaction to dye (contrast) that may be used during the procedure. What happens before the procedure? No specific preparation is needed. You may eat and drink normally. What happens during the procedure?   An IV tube may be inserted into one of your veins.  You may receive contrast  through this tube. A contrast is an injection that improves the quality of the pictures from your heart.  A gel will be applied to your chest.  A wand-like tool (transducer) will be moved over your chest. The gel will help to transmit the sound waves from the transducer.  The sound waves will harmlessly bounce off of your heart to allow the heart images to be captured in real-time motion. The images will be recorded on a computer. The procedure may vary among health care providers and hospitals. What happens after the procedure?  You may return to your normal, everyday life, including diet, activities, and medicines, unless your health care provider tells you not to do that. Summary  An echocardiogram is a procedure that uses painless sound waves (ultrasound) to produce an image of the heart.  Images from an echocardiogram can provide important information about the size and shape of your heart, heart muscle function, heart valve function, and fluid buildup around your heart.  You do not need to do anything to prepare before this procedure. You may eat and drink normally.  After the  echocardiogram is completed, you may return to your normal, everyday life, unless your health care provider tells you not to do that. This information is not intended to replace advice given to you by your health care provider. Make sure you discuss any questions you have with your health care provider. Document Revised: 04/14/2018 Document Reviewed: 01/25/2016 Elsevier Patient Education  [place].

## 2019-07-19 NOTE — Progress Notes (Signed)
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 and tea are good in moderation, but skip sugary drinks and limit milk and dairy products to one or two daily servings.   The type of carbohydrate in the diet is more important than the amount. Some sources of carbohydrates, such as vegetables, fruits, whole grains, and beans-are healthier than others.   Finally, stay active  Signed, Berniece Salines, DO  07/19/2019 4:14 PM    [facility]

## 2019-07-24 DIAGNOSIS — J441 Chronic obstructive pulmonary disease with (acute) exacerbation: Secondary | ICD-10-CM | POA: Diagnosis not present

## 2019-07-24 DIAGNOSIS — E1140 Type 2 diabetes mellitus with diabetic neuropathy, unspecified: Secondary | ICD-10-CM | POA: Diagnosis not present

## 2019-07-24 DIAGNOSIS — J9610 Chronic respiratory failure, unspecified whether with hypoxia or hypercapnia: Secondary | ICD-10-CM | POA: Diagnosis not present

## 2019-07-24 DIAGNOSIS — F319 Bipolar disorder, unspecified: Secondary | ICD-10-CM | POA: Diagnosis not present

## 2019-07-30 DIAGNOSIS — I130 Hypertensive heart and chronic kidney disease with heart failure and stage 1 through stage 4 chronic kidney disease, or unspecified chronic kidney disease: Secondary | ICD-10-CM | POA: Diagnosis not present

## 2019-07-30 DIAGNOSIS — F419 Anxiety disorder, unspecified: Secondary | ICD-10-CM | POA: Diagnosis not present

## 2019-07-30 DIAGNOSIS — G4733 Obstructive sleep apnea (adult) (pediatric): Secondary | ICD-10-CM | POA: Diagnosis not present

## 2019-07-30 DIAGNOSIS — E1165 Type 2 diabetes mellitus with hyperglycemia: Secondary | ICD-10-CM | POA: Diagnosis not present

## 2019-07-30 DIAGNOSIS — E1122 Type 2 diabetes mellitus with diabetic chronic kidney disease: Secondary | ICD-10-CM | POA: Diagnosis not present

## 2019-07-30 DIAGNOSIS — E1151 Type 2 diabetes mellitus with diabetic peripheral angiopathy without gangrene: Secondary | ICD-10-CM | POA: Diagnosis not present

## 2019-07-30 DIAGNOSIS — I509 Heart failure, unspecified: Secondary | ICD-10-CM | POA: Diagnosis not present

## 2019-07-30 DIAGNOSIS — F4320 Adjustment disorder, unspecified: Secondary | ICD-10-CM | POA: Diagnosis not present

## 2019-07-30 DIAGNOSIS — E669 Obesity, unspecified: Secondary | ICD-10-CM | POA: Diagnosis not present

## 2019-07-30 DIAGNOSIS — J449 Chronic obstructive pulmonary disease, unspecified: Secondary | ICD-10-CM | POA: Diagnosis not present

## 2019-07-30 DIAGNOSIS — M1990 Unspecified osteoarthritis, unspecified site: Secondary | ICD-10-CM | POA: Diagnosis not present

## 2019-07-30 DIAGNOSIS — N3000 Acute cystitis without hematuria: Secondary | ICD-10-CM | POA: Diagnosis not present

## 2019-07-30 DIAGNOSIS — R339 Retention of urine, unspecified: Secondary | ICD-10-CM | POA: Diagnosis not present

## 2019-07-30 DIAGNOSIS — I872 Venous insufficiency (chronic) (peripheral): Secondary | ICD-10-CM | POA: Diagnosis not present

## 2019-07-30 DIAGNOSIS — F319 Bipolar disorder, unspecified: Secondary | ICD-10-CM | POA: Diagnosis not present

## 2019-07-30 DIAGNOSIS — N183 Chronic kidney disease, stage 3 unspecified: Secondary | ICD-10-CM | POA: Diagnosis not present

## 2019-08-03 DIAGNOSIS — M62549 Muscle wasting and atrophy, not elsewhere classified, unspecified hand: Secondary | ICD-10-CM | POA: Diagnosis not present

## 2019-08-03 DIAGNOSIS — K5900 Constipation, unspecified: Secondary | ICD-10-CM | POA: Diagnosis not present

## 2019-08-03 DIAGNOSIS — G43909 Migraine, unspecified, not intractable, without status migrainosus: Secondary | ICD-10-CM | POA: Diagnosis not present

## 2019-08-03 DIAGNOSIS — G5622 Lesion of ulnar nerve, left upper limb: Secondary | ICD-10-CM | POA: Diagnosis not present

## 2019-08-06 DIAGNOSIS — J449 Chronic obstructive pulmonary disease, unspecified: Secondary | ICD-10-CM | POA: Diagnosis not present

## 2019-08-06 DIAGNOSIS — I509 Heart failure, unspecified: Secondary | ICD-10-CM | POA: Diagnosis not present

## 2019-08-09 ENCOUNTER — Ambulatory Visit (INDEPENDENT_AMBULATORY_CARE_PROVIDER_SITE_OTHER): Payer: Medicare Other

## 2019-08-09 ENCOUNTER — Other Ambulatory Visit: Payer: Self-pay

## 2019-08-09 DIAGNOSIS — R0602 Shortness of breath: Secondary | ICD-10-CM | POA: Diagnosis not present

## 2019-08-09 LAB — ECHOCARDIOGRAM COMPLETE
Area-P 1/2: 3.21 cm2
S' Lateral: 3.4 cm

## 2019-08-09 NOTE — Progress Notes (Signed)
Complete echocardiogram performed.  Jimmy Jennings Stirling RDCS,RVT

## 2019-08-10 ENCOUNTER — Telehealth: Payer: Self-pay

## 2019-08-10 DIAGNOSIS — R42 Dizziness and giddiness: Secondary | ICD-10-CM | POA: Diagnosis not present

## 2019-08-10 NOTE — Telephone Encounter (Signed)
Spoke with patients sister regarding results and recommendation.  She verbalizes understanding and is agreeable to plan of care. Advised patient to call back with any issues or concerns.

## 2019-08-10 NOTE — Telephone Encounter (Signed)
-----   Message from Kardie Tobb, DO sent at 08/09/2019  5:31 PM EDT ----- °The echo showed that the heart is not fully relaxing like it should ( diastolic dysfunction) ,but otherwise normal. I will discuss it at the next office visit. °

## 2019-08-10 NOTE — Telephone Encounter (Signed)
Spoke with patients sister just now and let her know the patients test results again. She verbalizes understanding and thanks me for the call back.    Encouraged patient to call back with any questions or concerns.

## 2019-08-10 NOTE — Telephone Encounter (Signed)
-----   Message from Abelardo Diesel sent at 08/10/2019  9:49 AM EDT ----- Regarding: Faith Flores I called this pt to r/s an appt and she asked about her "heart not working". She said she spoke with you the other day. I told her I would have you call her when you get a chance.   Thank you!  Hermenia Bers

## 2019-08-17 ENCOUNTER — Institutional Professional Consult (permissible substitution): Payer: Medicare Other | Admitting: Pulmonary Disease

## 2019-08-23 DIAGNOSIS — G5622 Lesion of ulnar nerve, left upper limb: Secondary | ICD-10-CM | POA: Diagnosis not present

## 2019-08-24 ENCOUNTER — Ambulatory Visit: Payer: Medicare Other | Admitting: Cardiology

## 2019-08-25 ENCOUNTER — Encounter: Payer: Self-pay | Admitting: Pulmonary Disease

## 2019-08-25 ENCOUNTER — Ambulatory Visit: Payer: Medicare Other | Admitting: Pulmonary Disease

## 2019-08-25 ENCOUNTER — Other Ambulatory Visit: Payer: Self-pay

## 2019-08-25 VITALS — BP 126/68 | HR 71 | Temp 97.8°F | Ht 61.0 in | Wt 289.0 lb

## 2019-08-25 DIAGNOSIS — Z6841 Body Mass Index (BMI) 40.0 and over, adult: Secondary | ICD-10-CM | POA: Diagnosis not present

## 2019-08-25 DIAGNOSIS — R0600 Dyspnea, unspecified: Secondary | ICD-10-CM | POA: Diagnosis not present

## 2019-08-25 DIAGNOSIS — R0609 Other forms of dyspnea: Secondary | ICD-10-CM

## 2019-08-25 NOTE — Patient Instructions (Addendum)
Nice to meet you.  Let's get breathing tests in the next couple of weeks.  Based on results, we will discuss next steps. I anticipate prescribing an inhaler but I want to see the results before picking which one will be best.   Come back in 3 months with Dr. Judeth Horn.
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
## 2019-08-28 DIAGNOSIS — E1140 Type 2 diabetes mellitus with diabetic neuropathy, unspecified: Secondary | ICD-10-CM | POA: Diagnosis not present

## 2019-08-29 ENCOUNTER — Ambulatory Visit: Payer: Medicare Other | Admitting: Cardiology

## 2019-08-29 ENCOUNTER — Other Ambulatory Visit: Payer: Self-pay

## 2019-08-29 ENCOUNTER — Encounter: Payer: Self-pay | Admitting: Cardiology

## 2019-08-29 VITALS — BP 110/60 | HR 74 | Ht 61.0 in | Wt 266.0 lb

## 2019-08-29 DIAGNOSIS — R600 Localized edema: Secondary | ICD-10-CM

## 2019-08-29 DIAGNOSIS — G4733 Obstructive sleep apnea (adult) (pediatric): Secondary | ICD-10-CM

## 2019-08-29 DIAGNOSIS — I447 Left bundle-branch block, unspecified: Secondary | ICD-10-CM

## 2019-08-29 DIAGNOSIS — I471 Supraventricular tachycardia, unspecified: Secondary | ICD-10-CM

## 2019-08-29 DIAGNOSIS — Z9989 Dependence on other enabling machines and devices: Secondary | ICD-10-CM

## 2019-08-29 DIAGNOSIS — I10 Essential (primary) hypertension: Secondary | ICD-10-CM | POA: Diagnosis not present

## 2019-08-29 MED ORDER — FUROSEMIDE 20 MG PO TABS: 20.0000 mg | ORAL_TABLET | Freq: Every day | ORAL | 1 refills | Status: DC

## 2019-08-29 NOTE — Patient Instructions (Addendum)
Medication Instructions:   START TAKING LASIX 20 MG ONCE A DAY   *If you need a refill on your cardiac medications before your next appointment, please call your pharmacy*   Lab Work:  BMET AND MAG TODAY   If you have labs (blood work) drawn today and your tests are completely normal, you will receive your results only by: Marland Kitchen MyChart Message (if you have MyChart) OR . A paper copy in the mail If you have any lab test that is abnormal or we need to change your treatment, we will call you to review the results.   Testing/Procedures: NONE ORDERED  TODAY    Follow-Up: At [facility], you and your health needs are our priority.  As part of our continuing mission to provide you with exceptional heart care, we have created designated Provider Care Teams.  These Care Teams include your primary Cardiologist (physician) and Advanced Practice Providers (APPs -  Physician Assistants and Nurse Practitioners) who all work together to provide you with the care you need, when you need it.  We recommend signing up for the patient portal called "MyChart".  Sign up information is provided on this After Visit Summary.  MyChart is used to connect with patients for Virtual Visits (Telemedicine).  Patients are able to view lab/test results, encounter notes, upcoming appointments, etc.  Non-urgent messages can be sent to your provider as well.   To learn more about what you can do with MyChart, go to [email or web address].    Your next appointment:   3 month(s)  The format for your next appointment:   In Person  Provider:   You will see Thomasene Ripple, DO.  Or, you can be scheduled with the following Advanced Practice Provider on your designated Care Team (at our [facility]):  Gillian Shields, FNP     Other Instructions
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
## 2019-08-30 ENCOUNTER — Telehealth: Payer: Self-pay

## 2019-08-30 DIAGNOSIS — I10 Essential (primary) hypertension: Secondary | ICD-10-CM

## 2019-08-30 LAB — BASIC METABOLIC PANEL
BUN/Creatinine Ratio: 13 (ref 12–28)
BUN: 13 mg/dL (ref 8–27)
CO2: 22 mmol/L (ref 20–29)
Calcium: 8.7 mg/dL (ref 8.7–10.3)
Chloride: 101 mmol/L (ref 96–106)
Creatinine, Ser: 1.02 mg/dL — ABNORMAL HIGH (ref 0.57–1.00)
GFR calc Af Amer: 68 mL/min/{1.73_m2} (ref >59)
GFR calc non Af Amer: 59 mL/min/{1.73_m2} — ABNORMAL LOW (ref >59)
Glucose: 283 mg/dL — ABNORMAL HIGH (ref 65–99)
Potassium: 3.9 mmol/L (ref 3.5–5.2)
Sodium: 139 mmol/L (ref 134–144)

## 2019-08-30 LAB — MAGNESIUM: Magnesium: 1.4 mg/dL — ABNORMAL LOW (ref 1.6–2.3)

## 2019-08-30 MED ORDER — MAGNESIUM OXIDE 400 MG PO CAPS: 400.0000 mg | ORAL_CAPSULE | Freq: Two times a day (BID) | ORAL | 0 refills | Status: DC

## 2019-08-30 NOTE — Telephone Encounter (Signed)
Spoke with patient regarding results and recommendation.  Patient verbalizes understanding and is agreeable to plan of care. Advised patient to call back with any issues or concerns.  

## 2019-08-30 NOTE — Telephone Encounter (Signed)
-----   Message from Thomasene Ripple, DO sent at 08/30/2019 10:19 AM EDT -----   Blood glucose elevated, creatinine 1.02 which is just slightly elevated.  I like to replete her magnesium, magnesium oxide 400 mg twice a day for 5 days.  Like to get follow-up blood work in 1 week.

## 2019-08-31 DIAGNOSIS — Z9181 History of falling: Secondary | ICD-10-CM | POA: Diagnosis not present

## 2019-08-31 DIAGNOSIS — M545 Low back pain: Secondary | ICD-10-CM | POA: Diagnosis not present

## 2019-08-31 DIAGNOSIS — S32000A Wedge compression fracture of unspecified lumbar vertebra, initial encounter for closed fracture: Secondary | ICD-10-CM | POA: Diagnosis not present

## 2019-08-31 DIAGNOSIS — M62838 Other muscle spasm: Secondary | ICD-10-CM | POA: Diagnosis not present

## 2019-09-01 ENCOUNTER — Telehealth: Payer: Self-pay | Admitting: Cardiology

## 2019-09-01 DIAGNOSIS — R600 Localized edema: Secondary | ICD-10-CM

## 2019-09-01 DIAGNOSIS — I10 Essential (primary) hypertension: Secondary | ICD-10-CM

## 2019-09-01 NOTE — Telephone Encounter (Signed)
Pt calling with c/o 7lb weight gain in the last 2 days despite the increase in her lasix dose. This morning her scale read 272lbs. She states her urine output has not increased, but decreased since the increase in the dose. She states her overnight output was and for the day only of dark clear urine. She states she has DOE and had difficulty in PT today.   I advised her I would send to Dr. Servando Salina for review and recommendation. We will follow up by the end of the day. She will decrease her fluid and sodium intake at this time as well.

## 2019-09-01 NOTE — Telephone Encounter (Signed)
Reviewed medication and fluid management plan with pt. She repeated instructions back and has agreed with the plan. She will come by the office on 9/10 for a repeat BMP. Order has been placed.   She had no additional questions.

## 2019-09-01 NOTE — Telephone Encounter (Signed)
Beth, patient's home health nurse called in as well to follow up. She states that the patient is not urinating as much as she normally does. She states her cc is usually 500 but today it was at 320 cc's, 120 overnight. Please contact Beth as well.

## 2019-09-01 NOTE — Addendum Note (Signed)
Addended by: Oretha Milch on: 09/01/2019 03:22 PM   Modules accepted: Orders

## 2019-09-01 NOTE — Telephone Encounter (Signed)
Pt c/o swelling: STAT is pt has developed SOB within 24 hours  1) How much weight have you gained and in what time span? 6 lbs in 2 days  2) If swelling, where is the swelling located? Right lower leg  3) Are you currently taking a fluid pill? Yes  4) Are you currently SOB? Yes  5) Do you have a log of your daily weights (if so, list)? Yes  6) Have you gained 3 pounds in a day or 5 pounds in a week? Yes  7) Have you traveled recently? No

## 2019-09-01 NOTE — Telephone Encounter (Signed)
Please have her take 40 mg twice a day for the next 7 days.  And then she can go down to taking 40 mg daily.  I like to have her have blood work done after the 7 days of 40 mg twice daily.  And her potassium supplement should be also increased to 20 mEq twice daily for 7 days and then down to 20 mEq daily.

## 2019-09-05 DIAGNOSIS — E1140 Type 2 diabetes mellitus with diabetic neuropathy, unspecified: Secondary | ICD-10-CM | POA: Diagnosis not present

## 2019-09-05 DIAGNOSIS — I5030 Unspecified diastolic (congestive) heart failure: Secondary | ICD-10-CM | POA: Diagnosis not present

## 2019-09-05 DIAGNOSIS — R338 Other retention of urine: Secondary | ICD-10-CM | POA: Diagnosis not present

## 2019-09-05 DIAGNOSIS — G894 Chronic pain syndrome: Secondary | ICD-10-CM | POA: Diagnosis not present

## 2019-09-05 DIAGNOSIS — N310 Uninhibited neuropathic bladder, not elsewhere classified: Secondary | ICD-10-CM | POA: Diagnosis not present

## 2019-09-06 DIAGNOSIS — I509 Heart failure, unspecified: Secondary | ICD-10-CM | POA: Diagnosis not present

## 2019-09-06 DIAGNOSIS — J449 Chronic obstructive pulmonary disease, unspecified: Secondary | ICD-10-CM | POA: Diagnosis not present

## 2019-09-08 ENCOUNTER — Other Ambulatory Visit: Payer: Self-pay | Admitting: *Deleted

## 2019-09-08 DIAGNOSIS — S3992XA Unspecified injury of lower back, initial encounter: Secondary | ICD-10-CM | POA: Diagnosis not present

## 2019-09-08 DIAGNOSIS — W19XXXA Unspecified fall, initial encounter: Secondary | ICD-10-CM | POA: Diagnosis not present

## 2019-09-08 DIAGNOSIS — M48061 Spinal stenosis, lumbar region without neurogenic claudication: Secondary | ICD-10-CM | POA: Diagnosis not present

## 2019-09-08 DIAGNOSIS — S32000A Wedge compression fracture of unspecified lumbar vertebra, initial encounter for closed fracture: Secondary | ICD-10-CM | POA: Diagnosis not present

## 2019-09-08 DIAGNOSIS — E1140 Type 2 diabetes mellitus with diabetic neuropathy, unspecified: Secondary | ICD-10-CM | POA: Diagnosis not present

## 2019-09-08 DIAGNOSIS — R0600 Dyspnea, unspecified: Secondary | ICD-10-CM

## 2019-09-08 DIAGNOSIS — R0609 Other forms of dyspnea: Secondary | ICD-10-CM

## 2019-09-08 DIAGNOSIS — M5136 Other intervertebral disc degeneration, lumbar region: Secondary | ICD-10-CM | POA: Diagnosis not present

## 2019-09-14 DIAGNOSIS — S300XXS Contusion of lower back and pelvis, sequela: Secondary | ICD-10-CM | POA: Diagnosis not present

## 2019-09-14 DIAGNOSIS — M47816 Spondylosis without myelopathy or radiculopathy, lumbar region: Secondary | ICD-10-CM | POA: Diagnosis not present

## 2019-09-14 DIAGNOSIS — M545 Low back pain: Secondary | ICD-10-CM | POA: Diagnosis not present

## 2019-09-25 DIAGNOSIS — E119 Type 2 diabetes mellitus without complications: Secondary | ICD-10-CM | POA: Diagnosis not present

## 2019-09-25 DIAGNOSIS — I10 Essential (primary) hypertension: Secondary | ICD-10-CM | POA: Diagnosis not present

## 2019-09-25 DIAGNOSIS — I509 Heart failure, unspecified: Secondary | ICD-10-CM | POA: Diagnosis not present

## 2019-09-25 DIAGNOSIS — I472 Ventricular tachycardia: Secondary | ICD-10-CM | POA: Diagnosis not present

## 2019-10-02 DIAGNOSIS — I509 Heart failure, unspecified: Secondary | ICD-10-CM | POA: Diagnosis not present

## 2019-10-02 DIAGNOSIS — I10 Essential (primary) hypertension: Secondary | ICD-10-CM | POA: Diagnosis not present

## 2019-10-02 DIAGNOSIS — I472 Ventricular tachycardia: Secondary | ICD-10-CM | POA: Diagnosis not present

## 2019-10-02 DIAGNOSIS — E119 Type 2 diabetes mellitus without complications: Secondary | ICD-10-CM | POA: Diagnosis not present

## 2019-10-05 ENCOUNTER — Telehealth: Payer: Self-pay | Admitting: Cardiology

## 2019-10-05 DIAGNOSIS — E119 Type 2 diabetes mellitus without complications: Secondary | ICD-10-CM | POA: Diagnosis not present

## 2019-10-05 DIAGNOSIS — F319 Bipolar disorder, unspecified: Secondary | ICD-10-CM | POA: Diagnosis not present

## 2019-10-05 DIAGNOSIS — I10 Essential (primary) hypertension: Secondary | ICD-10-CM | POA: Diagnosis not present

## 2019-10-05 DIAGNOSIS — E7800 Pure hypercholesterolemia, unspecified: Secondary | ICD-10-CM | POA: Diagnosis not present

## 2019-10-05 NOTE — Telephone Encounter (Signed)
New Message:     Pt would like to switch from Dr Servando Salina to Dr Lalla Brothers service. The reason for the switch is pt is transferring to a facility in [place].  This change will make it easier for her to get to her appointments.

## 2019-10-06 NOTE — Telephone Encounter (Signed)
That will be fine with me. 

## 2019-10-09 DIAGNOSIS — E119 Type 2 diabetes mellitus without complications: Secondary | ICD-10-CM | POA: Diagnosis not present

## 2019-10-09 DIAGNOSIS — E785 Hyperlipidemia, unspecified: Secondary | ICD-10-CM | POA: Diagnosis not present

## 2019-10-09 DIAGNOSIS — I10 Essential (primary) hypertension: Secondary | ICD-10-CM | POA: Diagnosis not present

## 2019-10-09 DIAGNOSIS — I509 Heart failure, unspecified: Secondary | ICD-10-CM | POA: Diagnosis not present

## 2019-10-10 ENCOUNTER — Ambulatory Visit: Payer: Medicare Other | Admitting: Pulmonary Disease

## 2019-10-10 ENCOUNTER — Other Ambulatory Visit: Payer: Self-pay

## 2019-10-10 DIAGNOSIS — R0600 Dyspnea, unspecified: Secondary | ICD-10-CM | POA: Diagnosis not present

## 2019-10-10 DIAGNOSIS — R0609 Other forms of dyspnea: Secondary | ICD-10-CM

## 2019-10-10 LAB — PULMONARY FUNCTION TEST
DL/VA % pred: 89 %
DL/VA: 3.85 ml/min/mmHg/L
DLCO cor % pred: 72 %
DLCO cor: 12.98 ml/min/mmHg
DLCO unc % pred: 72 %
DLCO unc: 12.98 ml/min/mmHg
FEF 25-75 Post: 1.7 L/sec
FEF 25-75 Pre: 1.62 L/sec
FEF2575-%Change-Post: 5 %
FEF2575-%Pred-Post: 83 %
FEF2575-%Pred-Pre: 78 %
FEV1-%Change-Post: 1 %
FEV1-%Pred-Post: 77 %
FEV1-%Pred-Pre: 76 %
FEV1-Post: 1.71 L
FEV1-Pre: 1.68 L
FEV1FVC-%Change-Post: 4 %
FEV1FVC-%Pred-Pre: 105 %
FEV6-%Change-Post: -2 %
FEV6-%Pred-Post: 72 %
FEV6-%Pred-Pre: 74 %
FEV6-Post: 2 L
FEV6-Pre: 2.05 L
FEV6FVC-%Pred-Post: 104 %
FEV6FVC-%Pred-Pre: 104 %
FVC-%Change-Post: -2 %
FVC-%Pred-Post: 69 %
FVC-%Pred-Pre: 71 %
FVC-Post: 2 L
FVC-Pre: 2.05 L
Post FEV1/FVC ratio: 85 %
Post FEV6/FVC ratio: 100 %
Pre FEV1/FVC ratio: 82 %
Pre FEV6/FVC Ratio: 100 %
RV % pred: 65 %
RV: 1.25 L
TLC % pred: 76 %
TLC: 3.52 L

## 2019-10-10 NOTE — Progress Notes (Signed)
Full PFT performed today. °

## 2019-10-11 ENCOUNTER — Telehealth: Payer: Self-pay | Admitting: Pulmonary Disease

## 2019-10-11 DIAGNOSIS — G5622 Lesion of ulnar nerve, left upper limb: Secondary | ICD-10-CM | POA: Diagnosis not present

## 2019-10-11 NOTE — Telephone Encounter (Signed)
Spoke with [name]  She states that the pt told her that MH ordered her to start back on CPAP today  Pt only had PFT today, and did not see MH  I reviewed her last note and see no mention of starting back on CPAP  She states she thinks pt is confused  Nothing further needed

## 2019-10-12 DIAGNOSIS — I472 Ventricular tachycardia: Secondary | ICD-10-CM | POA: Diagnosis not present

## 2019-10-12 DIAGNOSIS — E119 Type 2 diabetes mellitus without complications: Secondary | ICD-10-CM | POA: Diagnosis not present

## 2019-10-12 DIAGNOSIS — I5023 Acute on chronic systolic (congestive) heart failure: Secondary | ICD-10-CM | POA: Diagnosis not present

## 2019-10-12 DIAGNOSIS — I10 Essential (primary) hypertension: Secondary | ICD-10-CM | POA: Diagnosis not present

## 2019-10-13 DIAGNOSIS — R0602 Shortness of breath: Secondary | ICD-10-CM | POA: Diagnosis not present

## 2019-10-16 ENCOUNTER — Telehealth: Payer: Self-pay | Admitting: Pulmonary Disease

## 2019-10-16 DIAGNOSIS — I10 Essential (primary) hypertension: Secondary | ICD-10-CM | POA: Diagnosis not present

## 2019-10-16 DIAGNOSIS — E785 Hyperlipidemia, unspecified: Secondary | ICD-10-CM | POA: Diagnosis not present

## 2019-10-16 DIAGNOSIS — I509 Heart failure, unspecified: Secondary | ICD-10-CM | POA: Diagnosis not present

## 2019-10-16 DIAGNOSIS — M25511 Pain in right shoulder: Secondary | ICD-10-CM | POA: Diagnosis not present

## 2019-10-16 DIAGNOSIS — E119 Type 2 diabetes mellitus without complications: Secondary | ICD-10-CM | POA: Diagnosis not present

## 2019-10-17 ENCOUNTER — Ambulatory Visit: Payer: Medicare Other | Admitting: Pulmonary Disease

## 2019-10-17 DIAGNOSIS — N390 Urinary tract infection, site not specified: Secondary | ICD-10-CM | POA: Diagnosis not present

## 2019-10-17 DIAGNOSIS — I10 Essential (primary) hypertension: Secondary | ICD-10-CM | POA: Diagnosis not present

## 2019-10-17 NOTE — Telephone Encounter (Signed)
Spoke with [name], ok per DPR and notified of results  She verbalized understanding and will inform the pt  Nothing further needed

## 2019-10-17 NOTE — Telephone Encounter (Signed)
Spoke with pt's sister, [name]  She states pt in a nursing home and is having some financial issues so cancelled her appt for today  She had PFT done 10/10/19 and is wondering if we can just call her with the results  Please advise, thanks!

## 2019-10-17 NOTE — Telephone Encounter (Signed)
PFTs were slightly abnormal. It  showed that she has mild difficulty getting air onto her lungs. I suspect this is due to extra water backing up into the lungs. I would like to get a CT scan to make sure we are not missing something else but this can wait until the financial issues are sorted out.

## 2019-10-18 DIAGNOSIS — A419 Sepsis, unspecified organism: Secondary | ICD-10-CM | POA: Diagnosis not present

## 2019-10-18 DIAGNOSIS — N390 Urinary tract infection, site not specified: Secondary | ICD-10-CM | POA: Diagnosis not present

## 2019-10-18 DIAGNOSIS — M6281 Muscle weakness (generalized): Secondary | ICD-10-CM | POA: Diagnosis not present

## 2019-10-18 DIAGNOSIS — S90852D Superficial foreign body, left foot, subsequent encounter: Secondary | ICD-10-CM | POA: Diagnosis not present

## 2019-10-23 DIAGNOSIS — G5622 Lesion of ulnar nerve, left upper limb: Secondary | ICD-10-CM | POA: Diagnosis not present

## 2019-10-23 DIAGNOSIS — I10 Essential (primary) hypertension: Secondary | ICD-10-CM | POA: Diagnosis not present

## 2019-10-23 DIAGNOSIS — I509 Heart failure, unspecified: Secondary | ICD-10-CM | POA: Diagnosis not present

## 2019-10-23 DIAGNOSIS — G5602 Carpal tunnel syndrome, left upper limb: Secondary | ICD-10-CM | POA: Diagnosis not present

## 2019-10-23 DIAGNOSIS — E785 Hyperlipidemia, unspecified: Secondary | ICD-10-CM | POA: Diagnosis not present

## 2019-10-23 DIAGNOSIS — E119 Type 2 diabetes mellitus without complications: Secondary | ICD-10-CM | POA: Diagnosis not present

## 2019-10-23 DIAGNOSIS — S4991XA Unspecified injury of right shoulder and upper arm, initial encounter: Secondary | ICD-10-CM | POA: Diagnosis not present

## 2019-10-24 DIAGNOSIS — N390 Urinary tract infection, site not specified: Secondary | ICD-10-CM | POA: Diagnosis not present

## 2019-10-26 DIAGNOSIS — N310 Uninhibited neuropathic bladder, not elsewhere classified: Secondary | ICD-10-CM | POA: Diagnosis not present

## 2019-10-26 DIAGNOSIS — R338 Other retention of urine: Secondary | ICD-10-CM | POA: Diagnosis not present

## 2019-10-31 DIAGNOSIS — R338 Other retention of urine: Secondary | ICD-10-CM | POA: Diagnosis not present

## 2019-10-31 DIAGNOSIS — N310 Uninhibited neuropathic bladder, not elsewhere classified: Secondary | ICD-10-CM | POA: Diagnosis not present

## 2019-11-03 DIAGNOSIS — I509 Heart failure, unspecified: Secondary | ICD-10-CM | POA: Diagnosis not present

## 2019-11-03 DIAGNOSIS — E119 Type 2 diabetes mellitus without complications: Secondary | ICD-10-CM | POA: Diagnosis not present

## 2019-11-03 DIAGNOSIS — E785 Hyperlipidemia, unspecified: Secondary | ICD-10-CM | POA: Diagnosis not present

## 2019-11-03 DIAGNOSIS — I10 Essential (primary) hypertension: Secondary | ICD-10-CM | POA: Diagnosis not present

## 2019-11-07 DIAGNOSIS — E1142 Type 2 diabetes mellitus with diabetic polyneuropathy: Secondary | ICD-10-CM | POA: Diagnosis not present

## 2019-11-09 DIAGNOSIS — N319 Neuromuscular dysfunction of bladder, unspecified: Secondary | ICD-10-CM | POA: Diagnosis not present

## 2019-11-09 DIAGNOSIS — I472 Ventricular tachycardia: Secondary | ICD-10-CM | POA: Diagnosis not present

## 2019-11-09 DIAGNOSIS — I509 Heart failure, unspecified: Secondary | ICD-10-CM | POA: Diagnosis not present

## 2019-11-09 DIAGNOSIS — E119 Type 2 diabetes mellitus without complications: Secondary | ICD-10-CM | POA: Diagnosis not present

## 2019-11-17 DIAGNOSIS — R2681 Unsteadiness on feet: Secondary | ICD-10-CM | POA: Diagnosis not present

## 2019-11-17 DIAGNOSIS — M79631 Pain in right forearm: Secondary | ICD-10-CM | POA: Diagnosis not present

## 2019-11-20 DIAGNOSIS — I10 Essential (primary) hypertension: Secondary | ICD-10-CM | POA: Diagnosis not present

## 2019-11-20 DIAGNOSIS — E119 Type 2 diabetes mellitus without complications: Secondary | ICD-10-CM | POA: Diagnosis not present

## 2019-11-20 DIAGNOSIS — E785 Hyperlipidemia, unspecified: Secondary | ICD-10-CM | POA: Diagnosis not present

## 2019-11-20 DIAGNOSIS — I509 Heart failure, unspecified: Secondary | ICD-10-CM | POA: Diagnosis not present

## 2019-11-21 DIAGNOSIS — N310 Uninhibited neuropathic bladder, not elsewhere classified: Secondary | ICD-10-CM | POA: Diagnosis not present

## 2019-11-21 DIAGNOSIS — N3020 Other chronic cystitis without hematuria: Secondary | ICD-10-CM | POA: Diagnosis not present

## 2019-11-21 DIAGNOSIS — R338 Other retention of urine: Secondary | ICD-10-CM | POA: Diagnosis not present

## 2019-11-21 DIAGNOSIS — R110 Nausea: Secondary | ICD-10-CM | POA: Diagnosis not present

## 2019-11-27 DIAGNOSIS — E785 Hyperlipidemia, unspecified: Secondary | ICD-10-CM | POA: Diagnosis not present

## 2019-11-27 DIAGNOSIS — I10 Essential (primary) hypertension: Secondary | ICD-10-CM | POA: Diagnosis not present

## 2019-11-27 DIAGNOSIS — I509 Heart failure, unspecified: Secondary | ICD-10-CM | POA: Diagnosis not present

## 2019-11-27 DIAGNOSIS — E119 Type 2 diabetes mellitus without complications: Secondary | ICD-10-CM | POA: Diagnosis not present

## 2019-11-29 ENCOUNTER — Ambulatory Visit: Payer: Medicare Other | Admitting: Cardiology

## 2019-12-04 ENCOUNTER — Other Ambulatory Visit: Payer: Self-pay

## 2019-12-04 ENCOUNTER — Ambulatory Visit (INDEPENDENT_AMBULATORY_CARE_PROVIDER_SITE_OTHER): Payer: Medicare Other | Admitting: Cardiology

## 2019-12-04 VITALS — BP 128/68 | HR 60 | Ht 61.0 in | Wt 258.0 lb

## 2019-12-04 DIAGNOSIS — I5032 Chronic diastolic (congestive) heart failure: Secondary | ICD-10-CM

## 2019-12-04 LAB — BASIC METABOLIC PANEL
BUN/Creatinine Ratio: 14 (ref 12–28)
BUN: 23 mg/dL (ref 8–27)
CO2: 25 mmol/L (ref 20–29)
Calcium: 9.7 mg/dL (ref 8.7–10.3)
Chloride: 98 mmol/L (ref 96–106)
Creatinine, Ser: 1.68 mg/dL — ABNORMAL HIGH (ref 0.57–1.00)
GFR calc Af Amer: 37 mL/min/{1.73_m2} — ABNORMAL LOW (ref >59)
GFR calc non Af Amer: 32 mL/min/{1.73_m2} — ABNORMAL LOW (ref >59)
Glucose: 214 mg/dL — ABNORMAL HIGH (ref 65–99)
Potassium: 4.5 mmol/L (ref 3.5–5.2)
Sodium: 138 mmol/L (ref 134–144)

## 2019-12-04 LAB — MAGNESIUM: Magnesium: 2 mg/dL (ref 1.6–2.3)

## 2019-12-04 MED ORDER — FUROSEMIDE 40 MG PO TABS: 40.0000 mg | ORAL_TABLET | Freq: Every day | ORAL | 3 refills | Status: DC
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
## 2019-12-04 NOTE — Patient Instructions (Addendum)
Medication Instructions:  Your physician has recommended you make the following change in your medication:   1.  INCREASE your furosemide (lasix)- Take 40 mg by mouth once a day  Labwork: You will get lab work today:  BMP and magnesium  Testing/Procedures: None ordered.  Follow-Up: Your physician wants you to follow-up in: 6 months with Dr. Lalla Brothers.   You will receive a reminder letter in the mail two months in advance. If you don't receive a letter, please call our office to schedule the follow-up appointment.  Any Other Special Instructions Will Be Listed Below (If Applicable).  If you need a refill on your cardiac medications before your next appointment, please call your pharmacy.

## 2019-12-05 ENCOUNTER — Telehealth: Payer: Self-pay

## 2019-12-05 NOTE — Telephone Encounter (Signed)
-----   Message from Lanier Prude, MD sent at 12/05/2019  8:20 AM EST ----- Labs reviewed. Renal function worse than on a check 3 months ago. Will have her see her primary care physician for repeat labs this week and to make sure there are no other contributors to her renal function (she has a recent bladder infection and indwelling foley catheter).

## 2019-12-05 NOTE — Telephone Encounter (Signed)
Outreach made to Pt's sister (POA).  Pt is currently residing in a nursing home with a physician on site.  Advised sister of current lab work. Will fax information to Pt's facility [facility]).  Faxed Pt's office note and recent labs to nursing facility.  Per sister-Pt is being closely followed by [facility] for foley catheter.

## 2019-12-07 DIAGNOSIS — E1100 Type 2 diabetes mellitus with hyperosmolarity without nonketotic hyperglycemic-hyperosmolar coma (NKHHC): Secondary | ICD-10-CM | POA: Diagnosis not present

## 2019-12-07 DIAGNOSIS — E785 Hyperlipidemia, unspecified: Secondary | ICD-10-CM | POA: Diagnosis not present

## 2019-12-07 DIAGNOSIS — I509 Heart failure, unspecified: Secondary | ICD-10-CM | POA: Diagnosis not present

## 2019-12-07 DIAGNOSIS — I10 Essential (primary) hypertension: Secondary | ICD-10-CM | POA: Diagnosis not present

## 2019-12-08 DIAGNOSIS — E785 Hyperlipidemia, unspecified: Secondary | ICD-10-CM | POA: Diagnosis not present

## 2019-12-08 DIAGNOSIS — I509 Heart failure, unspecified: Secondary | ICD-10-CM | POA: Diagnosis not present

## 2019-12-08 DIAGNOSIS — E119 Type 2 diabetes mellitus without complications: Secondary | ICD-10-CM | POA: Diagnosis not present

## 2019-12-08 DIAGNOSIS — I10 Essential (primary) hypertension: Secondary | ICD-10-CM | POA: Diagnosis not present

## 2019-12-11 DIAGNOSIS — N319 Neuromuscular dysfunction of bladder, unspecified: Secondary | ICD-10-CM | POA: Diagnosis not present

## 2019-12-11 DIAGNOSIS — I509 Heart failure, unspecified: Secondary | ICD-10-CM | POA: Diagnosis not present

## 2019-12-11 DIAGNOSIS — J302 Other seasonal allergic rhinitis: Secondary | ICD-10-CM | POA: Diagnosis not present

## 2019-12-11 DIAGNOSIS — E119 Type 2 diabetes mellitus without complications: Secondary | ICD-10-CM | POA: Diagnosis not present

## 2019-12-20 DIAGNOSIS — I10 Essential (primary) hypertension: Secondary | ICD-10-CM | POA: Diagnosis not present

## 2019-12-20 DIAGNOSIS — I480 Paroxysmal atrial fibrillation: Secondary | ICD-10-CM | POA: Diagnosis not present

## 2019-12-20 DIAGNOSIS — E785 Hyperlipidemia, unspecified: Secondary | ICD-10-CM | POA: Diagnosis not present

## 2019-12-20 DIAGNOSIS — I509 Heart failure, unspecified: Secondary | ICD-10-CM | POA: Diagnosis not present

## 2019-12-27 DIAGNOSIS — G5622 Lesion of ulnar nerve, left upper limb: Secondary | ICD-10-CM | POA: Diagnosis not present

## 2019-12-27 DIAGNOSIS — M25511 Pain in right shoulder: Secondary | ICD-10-CM | POA: Diagnosis not present

## 2020-01-25 DIAGNOSIS — E119 Type 2 diabetes mellitus without complications: Secondary | ICD-10-CM | POA: Diagnosis not present

## 2020-01-25 DIAGNOSIS — E785 Hyperlipidemia, unspecified: Secondary | ICD-10-CM | POA: Diagnosis not present

## 2020-01-25 DIAGNOSIS — I10 Essential (primary) hypertension: Secondary | ICD-10-CM | POA: Diagnosis not present

## 2020-01-25 DIAGNOSIS — I480 Paroxysmal atrial fibrillation: Secondary | ICD-10-CM | POA: Diagnosis not present

## 2020-02-19 DIAGNOSIS — E119 Type 2 diabetes mellitus without complications: Secondary | ICD-10-CM | POA: Diagnosis not present

## 2020-02-19 DIAGNOSIS — I10 Essential (primary) hypertension: Secondary | ICD-10-CM | POA: Diagnosis not present

## 2020-02-19 DIAGNOSIS — I509 Heart failure, unspecified: Secondary | ICD-10-CM | POA: Diagnosis not present

## 2020-02-19 DIAGNOSIS — E785 Hyperlipidemia, unspecified: Secondary | ICD-10-CM | POA: Diagnosis not present

## 2020-02-29 DIAGNOSIS — I10 Essential (primary) hypertension: Secondary | ICD-10-CM | POA: Diagnosis not present

## 2020-02-29 DIAGNOSIS — E119 Type 2 diabetes mellitus without complications: Secondary | ICD-10-CM | POA: Diagnosis not present

## 2020-02-29 DIAGNOSIS — E785 Hyperlipidemia, unspecified: Secondary | ICD-10-CM | POA: Diagnosis not present

## 2020-02-29 DIAGNOSIS — I509 Heart failure, unspecified: Secondary | ICD-10-CM | POA: Diagnosis not present

## 2020-03-07 ENCOUNTER — Emergency Department (HOSPITAL_COMMUNITY)
Admission: EM | Admit: 2020-03-07 | Discharge: 2020-03-08 | Disposition: A | Discharge status: Home / Self Care | Payer: Medicare Other | Attending: Emergency Medicine | Admitting: Emergency Medicine

## 2020-03-07 ENCOUNTER — Emergency Department (HOSPITAL_COMMUNITY): Payer: Medicare Other

## 2020-03-07 ENCOUNTER — Emergency Department (HOSPITAL_BASED_OUTPATIENT_CLINIC_OR_DEPARTMENT_OTHER): Payer: Medicare Other

## 2020-03-07 ENCOUNTER — Encounter (HOSPITAL_COMMUNITY): Payer: Self-pay

## 2020-03-07 DIAGNOSIS — M79661 Pain in right lower leg: Secondary | ICD-10-CM | POA: Diagnosis not present

## 2020-03-07 DIAGNOSIS — Z853 Personal history of malignant neoplasm of breast: Secondary | ICD-10-CM | POA: Insufficient documentation

## 2020-03-07 DIAGNOSIS — Z794 Long term (current) use of insulin: Secondary | ICD-10-CM | POA: Insufficient documentation

## 2020-03-07 DIAGNOSIS — Z87891 Personal history of nicotine dependence: Secondary | ICD-10-CM | POA: Diagnosis not present

## 2020-03-07 DIAGNOSIS — R0902 Hypoxemia: Secondary | ICD-10-CM | POA: Diagnosis not present

## 2020-03-07 DIAGNOSIS — J90 Pleural effusion, not elsewhere classified: Secondary | ICD-10-CM | POA: Diagnosis not present

## 2020-03-07 DIAGNOSIS — R55 Syncope and collapse: Secondary | ICD-10-CM

## 2020-03-07 DIAGNOSIS — J449 Chronic obstructive pulmonary disease, unspecified: Secondary | ICD-10-CM | POA: Insufficient documentation

## 2020-03-07 DIAGNOSIS — I130 Hypertensive heart and chronic kidney disease with heart failure and stage 1 through stage 4 chronic kidney disease, or unspecified chronic kidney disease: Secondary | ICD-10-CM | POA: Diagnosis not present

## 2020-03-07 DIAGNOSIS — R0602 Shortness of breath: Secondary | ICD-10-CM | POA: Insufficient documentation

## 2020-03-07 DIAGNOSIS — E1122 Type 2 diabetes mellitus with diabetic chronic kidney disease: Secondary | ICD-10-CM | POA: Insufficient documentation

## 2020-03-07 DIAGNOSIS — N183 Chronic kidney disease, stage 3 unspecified: Secondary | ICD-10-CM | POA: Insufficient documentation

## 2020-03-07 DIAGNOSIS — Z79899 Other long term (current) drug therapy: Secondary | ICD-10-CM | POA: Diagnosis not present

## 2020-03-07 DIAGNOSIS — M79604 Pain in right leg: Secondary | ICD-10-CM | POA: Insufficient documentation

## 2020-03-07 DIAGNOSIS — E1142 Type 2 diabetes mellitus with diabetic polyneuropathy: Secondary | ICD-10-CM | POA: Insufficient documentation

## 2020-03-07 DIAGNOSIS — Z7982 Long term (current) use of aspirin: Secondary | ICD-10-CM | POA: Diagnosis not present

## 2020-03-07 DIAGNOSIS — R0789 Other chest pain: Secondary | ICD-10-CM | POA: Diagnosis not present

## 2020-03-07 DIAGNOSIS — E039 Hypothyroidism, unspecified: Secondary | ICD-10-CM | POA: Insufficient documentation

## 2020-03-07 DIAGNOSIS — Z7951 Long term (current) use of inhaled steroids: Secondary | ICD-10-CM | POA: Insufficient documentation

## 2020-03-07 DIAGNOSIS — R000 Tachycardia, unspecified: Secondary | ICD-10-CM | POA: Diagnosis not present

## 2020-03-07 DIAGNOSIS — R609 Edema, unspecified: Secondary | ICD-10-CM

## 2020-03-07 DIAGNOSIS — J45909 Unspecified asthma, uncomplicated: Secondary | ICD-10-CM | POA: Diagnosis not present

## 2020-03-07 DIAGNOSIS — I5022 Chronic systolic (congestive) heart failure: Secondary | ICD-10-CM | POA: Diagnosis not present

## 2020-03-07 DIAGNOSIS — R079 Chest pain, unspecified: Secondary | ICD-10-CM | POA: Diagnosis not present

## 2020-03-07 DIAGNOSIS — J9811 Atelectasis: Secondary | ICD-10-CM | POA: Diagnosis not present

## 2020-03-07 HISTORY — DX: Chronic obstructive pulmonary disease, unspecified: J44.9

## 2020-03-07 LAB — BRAIN NATRIURETIC PEPTIDE: B Natriuretic Peptide: 29 pg/mL (ref 0.0–100.0)

## 2020-03-07 LAB — BASIC METABOLIC PANEL
Anion gap: 12 (ref 5–15)
BUN: 20 mg/dL (ref 8–23)
CO2: 23 mmol/L (ref 22–32)
Calcium: 9.3 mg/dL (ref 8.9–10.3)
Chloride: 100 mmol/L (ref 98–111)
Creatinine, Ser: 1.12 mg/dL — ABNORMAL HIGH (ref 0.44–1.00)
GFR, Estimated: 55 mL/min — ABNORMAL LOW (ref >60)
Glucose, Bld: 217 mg/dL — ABNORMAL HIGH (ref 70–99)
Potassium: 5.2 mmol/L — ABNORMAL HIGH (ref 3.5–5.1)
Sodium: 135 mmol/L (ref 135–145)

## 2020-03-07 LAB — CBC
HCT: 42.5 % (ref 36.0–46.0)
Hemoglobin: 13.1 g/dL (ref 12.0–15.0)
MCH: 24.8 pg — ABNORMAL LOW (ref 26.0–34.0)
MCHC: 30.8 g/dL (ref 30.0–36.0)
MCV: 80.5 fL (ref 80.0–100.0)
Platelets: 138 10*3/uL — ABNORMAL LOW (ref 150–400)
RBC: 5.28 MIL/uL — ABNORMAL HIGH (ref 3.87–5.11)
RDW: 16.8 % — ABNORMAL HIGH (ref 11.5–15.5)
WBC: 8.2 10*3/uL (ref 4.0–10.5)
nRBC: 0 % (ref 0.0–0.2)

## 2020-03-07 LAB — TROPONIN I (HIGH SENSITIVITY)
Troponin I (High Sensitivity): 5 ng/L (ref <18)
Troponin I (High Sensitivity): 6 ng/L (ref <18)

## 2020-03-07 LAB — TSH: TSH: 1.996 u[IU]/mL (ref 0.350–4.500)

## 2020-03-07 LAB — MAGNESIUM: Magnesium: 1.8 mg/dL (ref 1.7–2.4)

## 2020-03-07 LAB — T4, FREE: Free T4: 0.69 ng/dL (ref 0.61–1.12)

## 2020-03-07 MED ORDER — IOHEXOL 350 MG/ML SOLN
60.0000 mL | Freq: Once | INTRAVENOUS | Status: AC | PRN
  Administered 2020-03-07: 60 mL via INTRAVENOUS

## 2020-03-07 NOTE — Discharge Instructions (Addendum)
You were seen in the ER for shortness of breath, chest discomfort, fast heart rate  Labs, imaging were ok  Please call your cardiologist and make an appointment for re-evaluation in 1 week   Return for worsening symptoms

## 2020-03-07 NOTE — ED Provider Notes (Signed)
Physical Exam  BP 133/65 (BP Location: Right Arm)   Pulse (!) 103   Temp 97.9 F (36.6 C) (Oral)   Resp (!) 22   Ht 5\' 1"  (1.549 m)   Wt 111.1 kg   SpO2 95%   BMI 46.29 kg/m   Physical Exam Vitals and nursing note reviewed.  Constitutional:      General: She is awake. She is not in acute distress.    Appearance: She is well-developed and well-groomed. She is obese. She is not ill-appearing.  HENT:     Head: Normocephalic and atraumatic.     Right Ear: External ear normal.     Left Ear: External ear normal.     Nose: Nose normal.     Mouth/Throat:     Mouth: Mucous membranes are moist.     Pharynx: Oropharynx is clear. No oropharyngeal exudate or posterior oropharyngeal erythema.  Eyes:     General: No scleral icterus.       Right eye: No discharge.        Left eye: No discharge.  Cardiovascular:     Rate and Rhythm: Regular rhythm. Tachycardia present.  Pulmonary:     Effort: Pulmonary effort is normal. No respiratory distress.  Abdominal:     General: Abdomen is flat. There is no distension.     Palpations: Abdomen is soft.     Tenderness: There is no abdominal tenderness. There is no guarding or rebound.  Musculoskeletal:        General: No signs of injury.  Skin:    General: Skin is warm and dry.     Findings: No rash.  Neurological:     General: No focal deficit present.     Mental Status: She is alert and oriented to person, place, and time.     GCS: GCS eye subscore is 4. GCS verbal subscore is 5. GCS motor subscore is 6.     Sensory: Sensation is intact. No sensory deficit.     Motor: Motor function is intact. No weakness.     Gait: Gait is intact.  Psychiatric:        Behavior: Behavior is cooperative.     ED Course/Procedures   Clinical Course as of 03/08/20 0131  Thu Mar 07, 2020  1222 08/2019  IMPRESSIONS  1. Left ventricular ejection fraction, by estimation, is 55 to 60%. The  left ventricle has normal function. The left ventricle has no  regional  wall motion abnormalities. There is mild concentric left ventricular  hypertrophy. Left ventricular diastolic  parameters are consistent with Grade II diastolic dysfunction  (pseudonormalization).  2. Right ventricular systolic function is normal. The right ventricular  size is normal. There is normal pulmonary artery systolic pressure.  3. The mitral valve is normal in structure. Mild mitral valve  regurgitation. No evidence of mitral stenosis.  4. The aortic valve is normal in structure. Aortic valve regurgitation is  not visualized. No aortic stenosis is present.  5. The inferior vena cava is normal in size with greater than 50%  respiratory variability, suggesting right atrial pressure of 3 mmHg.  [CG]  1222 07/2019   The patient wore the monitor for 7 days 18 hours starting July 19, 2019. Indication: Dizziness  The minimum heart rate was 53 bpm, maximum heart rate was 164  bpm, and average heart rate was 70  bpm. Predominant underlying rhythm was Sinus Rhythm.  10 Supraventricular Tachycardia runs occurred, the run with the fastest interval lasting 12  beats with a maximum rate of 164 bpm, the longest lasting 26.1 secs with an average rate of 116 bpm.  Premature atrial complexes were rare less than 1%. Premature Ventricular complexes rare less than 1%.  No triggered tachycardia, no pauses, No AV block and no atrial fibrillation present. 5 patient triggered events: 1 associated with supraventricular tachycardia, no remaining associated with sinus rhythm.  Conclusion: This study is remarkable for symptomatic paroxysmal supraventricular tachycardia.  [CG]  1338 DG Chest Port 1 View IMPRESSION: Low lung volumes with bibasilar atelectasis. Bibasilar infiltrates/edema cannot be excluded. Small left pleural effusion. [CG]  1504 DG Chest Port 1 View Low lung volumes with bibasilar atelectasis. Bibasilar infiltrates/edema cannot be excluded. Small left pleural  effusion. [CG]  1504 EKG 12-Lead Sinus rhythm IVCD, consider atypical LBBB, new compared to 2004 ECG No STEMI Confirmed by Alvester Chou [phone]) on 03/07/2020 12:13:09 PM [CG]  1504 Hemoglobin: 13.1 [CG]  1504 Troponin I (High Sensitivity): 5 [CG]  1504 TSH: 1.996 [CG]  1504 T4,Free(Direct): 0.69 [CG]  1504 B Natriuretic Peptide: 29.0 [CG]    Clinical Course User Index [CG] Liberty Handy, PA-C    Procedures  MDM  Assumed care from Sharen Heck PA-C at 1500. Please refer to her note for H&P and initial MDM. Briefly, 64yoF presenting from SNF for lower leg pain and DOE this AM. BLE duplex US negative for blood clot. Plan will be to obtain CTA PE study and delta troponin. If both negative, anticipate discharge back to SNF.  Delta troponin stable. CTA chest negative for PE. Ambulating with walker and maintaining O2 sats >96%. Will discharge back to SNF and recommend follow up with Cardiologist and PCP on outpatient basis. Strict return precautions provided and discussed. Questions and concerns addressed. Patient verbalized understanding and amenable with discharge plan. Discharged in stable condition.    Tonia Brooms, MD 03/08/20 [id number]    Milagros Loll, MD 03/09/20 [phone]

## 2020-03-07 NOTE — ED Notes (Signed)
While ambulating pt's O2 stayed above 96%. Her heart rate increased from 109 to 156bpm.

## 2020-03-07 NOTE — ED Triage Notes (Signed)
Pt woke up this AM with chest pain and shortness of breath. Pt reports HR went up to 150s. A&Ox4.

## 2020-03-07 NOTE — ED Notes (Signed)
Attempted report to [facility].

## 2020-03-07 NOTE — ED Triage Notes (Signed)
Patient BIB [facility] from [place] for evaluation of chest pain and shortness of breath. Given 324 mg aspirin PTA. Describes pain as a pressure in the chest. 18g saline lock in left AC. Room air SpO2 93%, placed on 2L by EMS, SpO2 increased to 98%.

## 2020-03-07 NOTE — ED Notes (Signed)
Pt is cleared by MD for discharge. Pt is not agreeable to going home as pt states her HR went up to 156 bpm on ambulation. MD notified that pt does not agree with dispo.

## 2020-03-07 NOTE — ED Provider Notes (Addendum)
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 discharge.   Patient care transferred to oncoming EDP who will follow up on CTA, trop and determine disposition. Consider discharge if both benign. Discussed with EDP Trifan.  Final Clinical Impression(s) / ED Diagnoses Final diagnoses:  Chest pain in adult  Tachycardia  Right leg pain    Rx / DC Orders ED Discharge Orders    None         Liberty HandyGibbons, Leniya Breit J, PA-C 03/07/20 1541    Terald Sleeperrifan, Matthew J, MD 03/07/20 [phone]

## 2020-03-07 NOTE — Progress Notes (Signed)
Lower extremity venous has been completed.   Preliminary results in CV Proc.   Blanch Media 03/07/2020 1:16 PM

## 2020-03-21 DIAGNOSIS — K469 Unspecified abdominal hernia without obstruction or gangrene: Secondary | ICD-10-CM | POA: Diagnosis not present

## 2020-03-21 DIAGNOSIS — I509 Heart failure, unspecified: Secondary | ICD-10-CM | POA: Diagnosis not present

## 2020-03-21 DIAGNOSIS — I471 Supraventricular tachycardia: Secondary | ICD-10-CM | POA: Diagnosis not present

## 2020-03-21 DIAGNOSIS — E119 Type 2 diabetes mellitus without complications: Secondary | ICD-10-CM | POA: Diagnosis not present

## 2020-03-22 DIAGNOSIS — I5043 Acute on chronic combined systolic (congestive) and diastolic (congestive) heart failure: Secondary | ICD-10-CM | POA: Diagnosis not present

## 2020-03-22 DIAGNOSIS — K469 Unspecified abdominal hernia without obstruction or gangrene: Secondary | ICD-10-CM | POA: Diagnosis not present

## 2020-03-22 DIAGNOSIS — E119 Type 2 diabetes mellitus without complications: Secondary | ICD-10-CM | POA: Diagnosis not present

## 2020-03-22 DIAGNOSIS — I471 Supraventricular tachycardia: Secondary | ICD-10-CM | POA: Diagnosis not present

## 2020-03-23 DIAGNOSIS — R0600 Dyspnea, unspecified: Secondary | ICD-10-CM | POA: Diagnosis not present

## 2020-03-23 DIAGNOSIS — I517 Cardiomegaly: Secondary | ICD-10-CM | POA: Diagnosis not present

## 2020-03-25 DIAGNOSIS — J9610 Chronic respiratory failure, unspecified whether with hypoxia or hypercapnia: Secondary | ICD-10-CM | POA: Diagnosis not present

## 2020-03-25 DIAGNOSIS — I5040 Unspecified combined systolic (congestive) and diastolic (congestive) heart failure: Secondary | ICD-10-CM | POA: Diagnosis not present

## 2020-03-25 DIAGNOSIS — E119 Type 2 diabetes mellitus without complications: Secondary | ICD-10-CM | POA: Diagnosis not present

## 2020-04-04 DIAGNOSIS — Z125 Encounter for screening for malignant neoplasm of prostate: Secondary | ICD-10-CM | POA: Diagnosis not present

## 2020-04-04 DIAGNOSIS — I471 Supraventricular tachycardia: Secondary | ICD-10-CM | POA: Diagnosis not present

## 2020-04-04 DIAGNOSIS — E119 Type 2 diabetes mellitus without complications: Secondary | ICD-10-CM | POA: Diagnosis not present

## 2020-04-04 DIAGNOSIS — I5023 Acute on chronic systolic (congestive) heart failure: Secondary | ICD-10-CM | POA: Diagnosis not present

## 2020-04-04 DIAGNOSIS — I5043 Acute on chronic combined systolic (congestive) and diastolic (congestive) heart failure: Secondary | ICD-10-CM | POA: Diagnosis not present

## 2020-04-24 DIAGNOSIS — E119 Type 2 diabetes mellitus without complications: Secondary | ICD-10-CM | POA: Diagnosis not present

## 2020-04-24 DIAGNOSIS — K5900 Constipation, unspecified: Secondary | ICD-10-CM | POA: Diagnosis not present

## 2020-04-24 DIAGNOSIS — R0602 Shortness of breath: Secondary | ICD-10-CM | POA: Diagnosis not present

## 2020-04-24 DIAGNOSIS — I509 Heart failure, unspecified: Secondary | ICD-10-CM | POA: Diagnosis not present

## 2020-04-25 DIAGNOSIS — K5900 Constipation, unspecified: Secondary | ICD-10-CM | POA: Diagnosis not present

## 2020-04-25 DIAGNOSIS — J309 Allergic rhinitis, unspecified: Secondary | ICD-10-CM | POA: Diagnosis not present

## 2020-04-25 DIAGNOSIS — I509 Heart failure, unspecified: Secondary | ICD-10-CM | POA: Diagnosis not present

## 2020-04-25 DIAGNOSIS — R0602 Shortness of breath: Secondary | ICD-10-CM | POA: Diagnosis not present

## 2020-04-25 DIAGNOSIS — E119 Type 2 diabetes mellitus without complications: Secondary | ICD-10-CM | POA: Diagnosis not present

## 2020-04-26 DIAGNOSIS — R338 Other retention of urine: Secondary | ICD-10-CM | POA: Diagnosis not present

## 2020-04-26 DIAGNOSIS — N3020 Other chronic cystitis without hematuria: Secondary | ICD-10-CM | POA: Diagnosis not present

## 2020-05-02 DIAGNOSIS — K469 Unspecified abdominal hernia without obstruction or gangrene: Secondary | ICD-10-CM | POA: Diagnosis not present

## 2020-05-07 ENCOUNTER — Telehealth: Payer: Self-pay | Admitting: Cardiology

## 2020-05-07 DIAGNOSIS — J309 Allergic rhinitis, unspecified: Secondary | ICD-10-CM | POA: Diagnosis not present

## 2020-05-07 DIAGNOSIS — E119 Type 2 diabetes mellitus without complications: Secondary | ICD-10-CM | POA: Diagnosis not present

## 2020-05-07 DIAGNOSIS — K5900 Constipation, unspecified: Secondary | ICD-10-CM | POA: Diagnosis not present

## 2020-05-07 DIAGNOSIS — R0602 Shortness of breath: Secondary | ICD-10-CM | POA: Diagnosis not present

## 2020-05-07 NOTE — Telephone Encounter (Signed)
Encounter not needed

## 2020-05-08 ENCOUNTER — Other Ambulatory Visit: Payer: Self-pay

## 2020-05-08 ENCOUNTER — Encounter (HOSPITAL_COMMUNITY): Payer: Self-pay

## 2020-05-08 ENCOUNTER — Emergency Department (HOSPITAL_COMMUNITY)
Admission: EM | Admit: 2020-05-08 | Discharge: 2020-05-09 | Disposition: A | Discharge status: Home / Self Care | Payer: Medicare Other | Attending: Emergency Medicine | Admitting: Emergency Medicine

## 2020-05-08 ENCOUNTER — Emergency Department (HOSPITAL_COMMUNITY): Payer: Medicare Other

## 2020-05-08 DIAGNOSIS — K469 Unspecified abdominal hernia without obstruction or gangrene: Secondary | ICD-10-CM | POA: Insufficient documentation

## 2020-05-08 DIAGNOSIS — Z87891 Personal history of nicotine dependence: Secondary | ICD-10-CM | POA: Diagnosis not present

## 2020-05-08 DIAGNOSIS — R1084 Generalized abdominal pain: Secondary | ICD-10-CM | POA: Diagnosis not present

## 2020-05-08 DIAGNOSIS — N183 Chronic kidney disease, stage 3 unspecified: Secondary | ICD-10-CM | POA: Insufficient documentation

## 2020-05-08 DIAGNOSIS — Z79899 Other long term (current) drug therapy: Secondary | ICD-10-CM | POA: Diagnosis not present

## 2020-05-08 DIAGNOSIS — J449 Chronic obstructive pulmonary disease, unspecified: Secondary | ICD-10-CM | POA: Insufficient documentation

## 2020-05-08 DIAGNOSIS — G4489 Other headache syndrome: Secondary | ICD-10-CM | POA: Diagnosis not present

## 2020-05-08 DIAGNOSIS — Z7952 Long term (current) use of systemic steroids: Secondary | ICD-10-CM | POA: Insufficient documentation

## 2020-05-08 DIAGNOSIS — Z7982 Long term (current) use of aspirin: Secondary | ICD-10-CM | POA: Insufficient documentation

## 2020-05-08 DIAGNOSIS — J45909 Unspecified asthma, uncomplicated: Secondary | ICD-10-CM | POA: Diagnosis not present

## 2020-05-08 DIAGNOSIS — R519 Headache, unspecified: Secondary | ICD-10-CM | POA: Diagnosis not present

## 2020-05-08 DIAGNOSIS — E1140 Type 2 diabetes mellitus with diabetic neuropathy, unspecified: Secondary | ICD-10-CM | POA: Insufficient documentation

## 2020-05-08 DIAGNOSIS — Z794 Long term (current) use of insulin: Secondary | ICD-10-CM | POA: Insufficient documentation

## 2020-05-08 DIAGNOSIS — E1122 Type 2 diabetes mellitus with diabetic chronic kidney disease: Secondary | ICD-10-CM | POA: Insufficient documentation

## 2020-05-08 DIAGNOSIS — K2970 Gastritis, unspecified, without bleeding: Secondary | ICD-10-CM | POA: Diagnosis not present

## 2020-05-08 DIAGNOSIS — R109 Unspecified abdominal pain: Secondary | ICD-10-CM

## 2020-05-08 DIAGNOSIS — G43909 Migraine, unspecified, not intractable, without status migrainosus: Secondary | ICD-10-CM | POA: Diagnosis not present

## 2020-05-08 DIAGNOSIS — I130 Hypertensive heart and chronic kidney disease with heart failure and stage 1 through stage 4 chronic kidney disease, or unspecified chronic kidney disease: Secondary | ICD-10-CM | POA: Insufficient documentation

## 2020-05-08 DIAGNOSIS — R1030 Lower abdominal pain, unspecified: Secondary | ICD-10-CM | POA: Diagnosis not present

## 2020-05-08 DIAGNOSIS — I5022 Chronic systolic (congestive) heart failure: Secondary | ICD-10-CM | POA: Insufficient documentation

## 2020-05-08 DIAGNOSIS — R1110 Vomiting, unspecified: Secondary | ICD-10-CM | POA: Diagnosis not present

## 2020-05-08 LAB — COMPREHENSIVE METABOLIC PANEL
ALT: 13 U/L (ref 0–44)
AST: 17 U/L (ref 15–41)
Albumin: 3.5 g/dL (ref 3.5–5.0)
Alkaline Phosphatase: 133 U/L — ABNORMAL HIGH (ref 38–126)
Anion gap: 8 (ref 5–15)
BUN: 25 mg/dL — ABNORMAL HIGH (ref 8–23)
CO2: 29 mmol/L (ref 22–32)
Calcium: 9.1 mg/dL (ref 8.9–10.3)
Chloride: 101 mmol/L (ref 98–111)
Creatinine, Ser: 1.29 mg/dL — ABNORMAL HIGH (ref 0.44–1.00)
GFR, Estimated: 47 mL/min — ABNORMAL LOW (ref >60)
Glucose, Bld: 120 mg/dL — ABNORMAL HIGH (ref 70–99)
Potassium: 3.7 mmol/L (ref 3.5–5.1)
Sodium: 138 mmol/L (ref 135–145)
Total Bilirubin: 0.6 mg/dL (ref 0.3–1.2)
Total Protein: 7.3 g/dL (ref 6.5–8.1)

## 2020-05-08 LAB — CK: Total CK: 35 U/L — ABNORMAL LOW (ref 38–234)

## 2020-05-08 LAB — URINALYSIS, ROUTINE W REFLEX MICROSCOPIC
Bilirubin Urine: NEGATIVE
Glucose, UA: >=500 mg/dL — AB
Hgb urine dipstick: NEGATIVE
Ketones, ur: NEGATIVE mg/dL
Nitrite: NEGATIVE
Protein, ur: NEGATIVE mg/dL
Specific Gravity, Urine: 1.006 (ref 1.005–1.030)
pH: 6 (ref 5.0–8.0)

## 2020-05-08 LAB — CBC WITH DIFFERENTIAL/PLATELET
Abs Immature Granulocytes: 0.03 10*3/uL (ref 0.00–0.07)
Basophils Absolute: 0.1 10*3/uL (ref 0.0–0.1)
Basophils Relative: 1 %
Eosinophils Absolute: 0.2 10*3/uL (ref 0.0–0.5)
Eosinophils Relative: 3 %
HCT: 43.2 % (ref 36.0–46.0)
Hemoglobin: 13.4 g/dL (ref 12.0–15.0)
Immature Granulocytes: 0 %
Lymphocytes Relative: 32 %
Lymphs Abs: 2.5 10*3/uL (ref 0.7–4.0)
MCH: 25.3 pg — ABNORMAL LOW (ref 26.0–34.0)
MCHC: 31 g/dL (ref 30.0–36.0)
MCV: 81.7 fL (ref 80.0–100.0)
Monocytes Absolute: 0.7 10*3/uL (ref 0.1–1.0)
Monocytes Relative: 9 %
Neutro Abs: 4.5 10*3/uL (ref 1.7–7.7)
Neutrophils Relative %: 55 %
Platelets: 237 10*3/uL (ref 150–400)
RBC: 5.29 MIL/uL — ABNORMAL HIGH (ref 3.87–5.11)
RDW: 15.4 % (ref 11.5–15.5)
WBC: 8 10*3/uL (ref 4.0–10.5)
nRBC: 0 % (ref 0.0–0.2)

## 2020-05-08 LAB — LIPASE, BLOOD: Lipase: 31 U/L (ref 11–51)

## 2020-05-08 MED ORDER — SODIUM CHLORIDE 0.9 % IV BOLUS
250.0000 mL | Freq: Once | INTRAVENOUS | Status: AC
Start: 2020-05-08 — End: 2020-05-08
  Administered 2020-05-08: 250 mL via INTRAVENOUS

## 2020-05-08 MED ORDER — ACETAMINOPHEN 325 MG PO TABS
650.0000 mg | ORAL_TABLET | Freq: Once | ORAL | Status: AC
  Administered 2020-05-09: 650 mg via ORAL
  Filled 2020-05-08: qty 2

## 2020-05-08 MED ORDER — HYDROMORPHONE HCL 1 MG/ML IJ SOLN
0.5000 mg | Freq: Once | INTRAMUSCULAR | Status: AC
  Administered 2020-05-08: 0.5 mg via INTRAVENOUS
  Filled 2020-05-08: qty 1

## 2020-05-08 MED ORDER — IOHEXOL 300 MG/ML  SOLN
75.0000 mL | Freq: Once | INTRAMUSCULAR | Status: AC | PRN
  Administered 2020-05-08: 75 mL via INTRAVENOUS

## 2020-05-08 MED ORDER — SODIUM CHLORIDE 0.9 % IV SOLN
12.5000 mg | Freq: Four times a day (QID) | INTRAVENOUS | Status: DC | PRN
  Administered 2020-05-09: 12.5 mg via INTRAVENOUS
  Filled 2020-05-08: qty 0.5

## 2020-05-08 MED ORDER — POLYETHYLENE GLYCOL 3350 17 G PO PACK: 17.0000 g | PACK | Freq: Every day | ORAL | 0 refills | Status: AC

## 2020-05-08 NOTE — ED Notes (Signed)
Pt given heat pack for infiltrated IV site per pharmacy.

## 2020-05-08 NOTE — ED Triage Notes (Signed)
Per EMS: PT sent in by PCP to have CT of left sided abdominal hernia. Has been increasing in size. PT c/o migraine headache, N/V. Pain 8/10 in abdomen. 6/10 head.

## 2020-05-08 NOTE — Discharge Instructions (Addendum)
Today your blood work was reassuring.  Your CAT scan did not show any clear evidence of a obstruction. I have given you a prescription for MiraLAX.  This is also available over-the-counter.  Please stop taking the Dulcolax and start taking this instead.  Make sure that you are not drinking enough water with this. Please schedule a follow-up appointment with your primary care team.

## 2020-05-08 NOTE — ED Notes (Signed)
Pt in CT will obtain vitals when pt returns.
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
## 2020-05-08 NOTE — ED Notes (Signed)
Patient transported to CT 

## 2020-05-09 DIAGNOSIS — Z7401 Bed confinement status: Secondary | ICD-10-CM | POA: Diagnosis not present

## 2020-05-09 DIAGNOSIS — I959 Hypotension, unspecified: Secondary | ICD-10-CM | POA: Diagnosis not present

## 2020-05-09 DIAGNOSIS — R0989 Other specified symptoms and signs involving the circulatory and respiratory systems: Secondary | ICD-10-CM | POA: Diagnosis not present

## 2020-05-09 DIAGNOSIS — R1084 Generalized abdominal pain: Secondary | ICD-10-CM | POA: Diagnosis not present

## 2020-05-09 DIAGNOSIS — J90 Pleural effusion, not elsewhere classified: Secondary | ICD-10-CM | POA: Diagnosis not present

## 2020-05-09 DIAGNOSIS — M2550 Pain in unspecified joint: Secondary | ICD-10-CM | POA: Diagnosis not present

## 2020-05-09 DIAGNOSIS — K469 Unspecified abdominal hernia without obstruction or gangrene: Secondary | ICD-10-CM | POA: Diagnosis not present

## 2020-05-09 NOTE — ED Notes (Signed)
PTAR CALLED  °

## 2020-05-09 NOTE — ED Notes (Signed)
Report given to [place], Charity fundraiser at [facility].

## 2020-05-11 LAB — URINE CULTURE: Culture: >=100000 — AB

## 2020-05-12 ENCOUNTER — Telehealth: Payer: Self-pay | Admitting: Emergency Medicine

## 2020-05-12 NOTE — Telephone Encounter (Signed)
Post ED Visit - Positive Culture Follow-up  Culture report reviewed by antimicrobial stewardship pharmacist: [facility] []  , Pharm.D. []  Enzo Bi, .D., BCPS AQ-ID []  Celedonio Miyamoto, Pharm.D., BCPS []  [address], .D., BCPS []  [place], .D., BCPS, AAHIVP []  Georgina Pillion, Pharm.D., BCPS, AAHIVP []  [address], PharmD, BCPS []  , PharmD, BCPS []  [place], PharmD, BCPS [x]  [address], PharmD []  , PharmD, BCPS []  Estella Husk, PharmD  Pharmacy Team []  Lysle Pearl, PharmD []  , PharmD []  Phillips Climes, PharmD []  , Rph []  Agapito Games) , PharmD []  Daylene Posey, PharmD []  , PharmD []  Mervyn Gay, PharmD []  , PharmD []  Vinnie Level, PharmD []  [facility], PharmD []  , PharmD []  Len Childs, PharmD   Positive urine culture No further patient follow-up is required at this time.  05/12/2020, 4:46 PM

## 2020-05-13 ENCOUNTER — Other Ambulatory Visit: Payer: Self-pay

## 2020-05-13 ENCOUNTER — Ambulatory Visit (INDEPENDENT_AMBULATORY_CARE_PROVIDER_SITE_OTHER): Payer: Medicare Other | Admitting: Student

## 2020-05-13 ENCOUNTER — Encounter: Payer: Self-pay | Admitting: Student

## 2020-05-13 VITALS — BP 110/68 | HR 67 | Ht 61.0 in | Wt 265.0 lb

## 2020-05-13 DIAGNOSIS — I5032 Chronic diastolic (congestive) heart failure: Secondary | ICD-10-CM

## 2020-05-13 NOTE — Patient Instructions (Signed)
Medication Instructions:  Your physician recommends that you continue on your current medications as directed. Please refer to the Current Medication list given to you today.  *If you need a refill on your cardiac medications before your next appointment, please call your pharmacy*   Lab Work: None If you have labs (blood work) drawn today and your tests are completely normal, you will receive your results only by: Marland Kitchen MyChart Message (if you have MyChart) OR . A paper copy in the mail If you have any lab test that is abnormal or we need to change your treatment, we will call you to review the results.   Follow-Up: At [facility], you and your health needs are our priority.  As part of our continuing mission to provide you with exceptional heart care, we have created designated Provider Care Teams.  These Care Teams include your primary Cardiologist (physician) and Advanced Practice Providers (APPs -  Physician Assistants and Nurse Practitioners) who all work together to provide you with the care you need, when you need it.  Your next appointment:   6 month(s)  The format for your next appointment:   In Person  Provider:   You may see Steffanie Dunn, MD or one of the following Advanced Practice Providers on your designated Care Team:    Gypsy Balsam, NP  Francis Dowse, PA-C  Casimiro Needle "[place]" [place], [place]
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
## 2020-05-21 DIAGNOSIS — Z20828 Contact with and (suspected) exposure to other viral communicable diseases: Secondary | ICD-10-CM | POA: Diagnosis not present

## 2020-05-23 DIAGNOSIS — Z20828 Contact with and (suspected) exposure to other viral communicable diseases: Secondary | ICD-10-CM | POA: Diagnosis not present

## 2020-05-29 DIAGNOSIS — I509 Heart failure, unspecified: Secondary | ICD-10-CM | POA: Diagnosis not present

## 2020-05-29 DIAGNOSIS — G4700 Insomnia, unspecified: Secondary | ICD-10-CM | POA: Diagnosis not present

## 2020-05-29 DIAGNOSIS — I10 Essential (primary) hypertension: Secondary | ICD-10-CM | POA: Diagnosis not present

## 2020-05-29 DIAGNOSIS — E119 Type 2 diabetes mellitus without complications: Secondary | ICD-10-CM | POA: Diagnosis not present

## 2020-05-30 DIAGNOSIS — I509 Heart failure, unspecified: Secondary | ICD-10-CM | POA: Diagnosis not present

## 2020-05-30 DIAGNOSIS — E119 Type 2 diabetes mellitus without complications: Secondary | ICD-10-CM | POA: Diagnosis not present

## 2020-05-30 DIAGNOSIS — Z20828 Contact with and (suspected) exposure to other viral communicable diseases: Secondary | ICD-10-CM | POA: Diagnosis not present

## 2020-05-30 DIAGNOSIS — I10 Essential (primary) hypertension: Secondary | ICD-10-CM | POA: Diagnosis not present

## 2020-05-30 DIAGNOSIS — E785 Hyperlipidemia, unspecified: Secondary | ICD-10-CM | POA: Diagnosis not present

## 2020-05-31 DIAGNOSIS — R0602 Shortness of breath: Secondary | ICD-10-CM | POA: Diagnosis not present

## 2020-06-06 DIAGNOSIS — Z20828 Contact with and (suspected) exposure to other viral communicable diseases: Secondary | ICD-10-CM | POA: Diagnosis not present

## 2020-06-10 DIAGNOSIS — Z20828 Contact with and (suspected) exposure to other viral communicable diseases: Secondary | ICD-10-CM | POA: Diagnosis not present

## 2020-06-13 DIAGNOSIS — Z20828 Contact with and (suspected) exposure to other viral communicable diseases: Secondary | ICD-10-CM | POA: Diagnosis not present

## 2020-06-13 DIAGNOSIS — R1310 Dysphagia, unspecified: Secondary | ICD-10-CM | POA: Diagnosis not present

## 2020-06-17 DIAGNOSIS — Z20828 Contact with and (suspected) exposure to other viral communicable diseases: Secondary | ICD-10-CM | POA: Diagnosis not present

## 2020-06-20 DIAGNOSIS — Z20828 Contact with and (suspected) exposure to other viral communicable diseases: Secondary | ICD-10-CM | POA: Diagnosis not present

## 2020-06-21 DIAGNOSIS — K5909 Other constipation: Secondary | ICD-10-CM | POA: Diagnosis not present

## 2020-06-24 DIAGNOSIS — Z20828 Contact with and (suspected) exposure to other viral communicable diseases: Secondary | ICD-10-CM | POA: Diagnosis not present

## 2020-06-27 DIAGNOSIS — M47816 Spondylosis without myelopathy or radiculopathy, lumbar region: Secondary | ICD-10-CM | POA: Diagnosis not present

## 2020-06-28 DIAGNOSIS — E119 Type 2 diabetes mellitus without complications: Secondary | ICD-10-CM | POA: Diagnosis not present

## 2020-06-28 DIAGNOSIS — I10 Essential (primary) hypertension: Secondary | ICD-10-CM | POA: Diagnosis not present

## 2020-06-28 DIAGNOSIS — I472 Ventricular tachycardia: Secondary | ICD-10-CM | POA: Diagnosis not present

## 2020-06-28 DIAGNOSIS — I509 Heart failure, unspecified: Secondary | ICD-10-CM | POA: Diagnosis not present

## 2020-07-04 DIAGNOSIS — M25562 Pain in left knee: Secondary | ICD-10-CM | POA: Diagnosis not present

## 2020-07-05 DIAGNOSIS — M1712 Unilateral primary osteoarthritis, left knee: Secondary | ICD-10-CM | POA: Diagnosis not present

## 2020-07-15 DIAGNOSIS — R296 Repeated falls: Secondary | ICD-10-CM | POA: Diagnosis not present

## 2020-07-15 DIAGNOSIS — M79601 Pain in right arm: Secondary | ICD-10-CM | POA: Diagnosis not present

## 2020-07-19 DIAGNOSIS — F419 Anxiety disorder, unspecified: Secondary | ICD-10-CM | POA: Diagnosis not present

## 2020-07-25 DIAGNOSIS — I509 Heart failure, unspecified: Secondary | ICD-10-CM | POA: Diagnosis not present

## 2020-07-25 DIAGNOSIS — M79662 Pain in left lower leg: Secondary | ICD-10-CM | POA: Diagnosis not present

## 2020-07-25 DIAGNOSIS — Z23 Encounter for immunization: Secondary | ICD-10-CM | POA: Diagnosis not present

## 2020-07-25 DIAGNOSIS — I10 Essential (primary) hypertension: Secondary | ICD-10-CM | POA: Diagnosis not present

## 2020-07-25 DIAGNOSIS — F419 Anxiety disorder, unspecified: Secondary | ICD-10-CM | POA: Diagnosis not present

## 2020-07-29 DIAGNOSIS — K432 Incisional hernia without obstruction or gangrene: Secondary | ICD-10-CM | POA: Diagnosis not present

## 2020-07-29 DIAGNOSIS — M79662 Pain in left lower leg: Secondary | ICD-10-CM | POA: Diagnosis not present

## 2020-07-29 DIAGNOSIS — Z0000 Encounter for general adult medical examination without abnormal findings: Secondary | ICD-10-CM | POA: Diagnosis not present

## 2020-08-02 DIAGNOSIS — I472 Ventricular tachycardia: Secondary | ICD-10-CM | POA: Diagnosis not present

## 2020-08-02 DIAGNOSIS — I10 Essential (primary) hypertension: Secondary | ICD-10-CM | POA: Diagnosis not present

## 2020-08-02 DIAGNOSIS — E119 Type 2 diabetes mellitus without complications: Secondary | ICD-10-CM | POA: Diagnosis not present

## 2020-08-02 DIAGNOSIS — E785 Hyperlipidemia, unspecified: Secondary | ICD-10-CM | POA: Diagnosis not present

## 2020-08-06 DIAGNOSIS — R0602 Shortness of breath: Secondary | ICD-10-CM | POA: Diagnosis not present

## 2020-08-06 DIAGNOSIS — R600 Localized edema: Secondary | ICD-10-CM | POA: Diagnosis not present

## 2020-08-06 DIAGNOSIS — I509 Heart failure, unspecified: Secondary | ICD-10-CM | POA: Diagnosis not present

## 2020-08-07 DIAGNOSIS — R600 Localized edema: Secondary | ICD-10-CM | POA: Diagnosis not present

## 2020-08-07 DIAGNOSIS — E119 Type 2 diabetes mellitus without complications: Secondary | ICD-10-CM | POA: Diagnosis not present

## 2020-08-07 DIAGNOSIS — I10 Essential (primary) hypertension: Secondary | ICD-10-CM | POA: Diagnosis not present

## 2020-08-07 DIAGNOSIS — Z125 Encounter for screening for malignant neoplasm of prostate: Secondary | ICD-10-CM | POA: Diagnosis not present

## 2020-08-07 DIAGNOSIS — N390 Urinary tract infection, site not specified: Secondary | ICD-10-CM | POA: Diagnosis not present

## 2020-08-21 DIAGNOSIS — R6889 Other general symptoms and signs: Secondary | ICD-10-CM | POA: Diagnosis not present

## 2020-08-22 DIAGNOSIS — J90 Pleural effusion, not elsewhere classified: Secondary | ICD-10-CM | POA: Diagnosis not present

## 2020-08-22 DIAGNOSIS — R079 Chest pain, unspecified: Secondary | ICD-10-CM | POA: Diagnosis not present

## 2020-08-22 DIAGNOSIS — I10 Essential (primary) hypertension: Secondary | ICD-10-CM | POA: Diagnosis not present

## 2020-08-22 DIAGNOSIS — I5023 Acute on chronic systolic (congestive) heart failure: Secondary | ICD-10-CM | POA: Diagnosis not present

## 2020-08-26 DIAGNOSIS — I10 Essential (primary) hypertension: Secondary | ICD-10-CM | POA: Diagnosis not present

## 2020-08-26 DIAGNOSIS — E785 Hyperlipidemia, unspecified: Secondary | ICD-10-CM | POA: Diagnosis not present

## 2020-08-26 DIAGNOSIS — I472 Ventricular tachycardia: Secondary | ICD-10-CM | POA: Diagnosis not present

## 2020-08-26 DIAGNOSIS — E119 Type 2 diabetes mellitus without complications: Secondary | ICD-10-CM | POA: Diagnosis not present

## 2020-09-19 DIAGNOSIS — I10 Essential (primary) hypertension: Secondary | ICD-10-CM | POA: Diagnosis not present

## 2020-09-19 DIAGNOSIS — S43401A Unspecified sprain of right shoulder joint, initial encounter: Secondary | ICD-10-CM | POA: Diagnosis not present

## 2020-09-19 DIAGNOSIS — I509 Heart failure, unspecified: Secondary | ICD-10-CM | POA: Diagnosis not present

## 2020-09-19 DIAGNOSIS — E119 Type 2 diabetes mellitus without complications: Secondary | ICD-10-CM | POA: Diagnosis not present

## 2020-09-23 DIAGNOSIS — E119 Type 2 diabetes mellitus without complications: Secondary | ICD-10-CM | POA: Diagnosis not present

## 2020-09-23 DIAGNOSIS — I509 Heart failure, unspecified: Secondary | ICD-10-CM | POA: Diagnosis not present

## 2020-09-23 DIAGNOSIS — I10 Essential (primary) hypertension: Secondary | ICD-10-CM | POA: Diagnosis not present

## 2020-09-23 DIAGNOSIS — I472 Ventricular tachycardia: Secondary | ICD-10-CM | POA: Diagnosis not present

## 2020-09-28 DIAGNOSIS — F419 Anxiety disorder, unspecified: Secondary | ICD-10-CM | POA: Diagnosis not present

## 2020-09-30 DIAGNOSIS — M19021 Primary osteoarthritis, right elbow: Secondary | ICD-10-CM | POA: Diagnosis not present

## 2020-10-10 DIAGNOSIS — U071 COVID-19: Secondary | ICD-10-CM | POA: Diagnosis not present

## 2020-10-10 DIAGNOSIS — R2681 Unsteadiness on feet: Secondary | ICD-10-CM | POA: Diagnosis not present

## 2020-10-10 DIAGNOSIS — M6281 Muscle weakness (generalized): Secondary | ICD-10-CM | POA: Diagnosis not present

## 2020-10-10 DIAGNOSIS — N390 Urinary tract infection, site not specified: Secondary | ICD-10-CM | POA: Diagnosis not present

## 2020-10-11 DIAGNOSIS — M6281 Muscle weakness (generalized): Secondary | ICD-10-CM | POA: Diagnosis not present

## 2020-10-11 DIAGNOSIS — R2681 Unsteadiness on feet: Secondary | ICD-10-CM | POA: Diagnosis not present

## 2020-10-11 DIAGNOSIS — U071 COVID-19: Secondary | ICD-10-CM | POA: Diagnosis not present

## 2020-10-11 DIAGNOSIS — N390 Urinary tract infection, site not specified: Secondary | ICD-10-CM | POA: Diagnosis not present

## 2020-10-15 DIAGNOSIS — M6281 Muscle weakness (generalized): Secondary | ICD-10-CM | POA: Diagnosis not present

## 2020-10-15 DIAGNOSIS — R2681 Unsteadiness on feet: Secondary | ICD-10-CM | POA: Diagnosis not present

## 2020-10-15 DIAGNOSIS — N390 Urinary tract infection, site not specified: Secondary | ICD-10-CM | POA: Diagnosis not present

## 2020-10-15 DIAGNOSIS — U071 COVID-19: Secondary | ICD-10-CM | POA: Diagnosis not present

## 2020-10-16 DIAGNOSIS — R2681 Unsteadiness on feet: Secondary | ICD-10-CM | POA: Diagnosis not present

## 2020-10-16 DIAGNOSIS — N390 Urinary tract infection, site not specified: Secondary | ICD-10-CM | POA: Diagnosis not present

## 2020-10-16 DIAGNOSIS — U071 COVID-19: Secondary | ICD-10-CM | POA: Diagnosis not present

## 2020-10-16 DIAGNOSIS — M6281 Muscle weakness (generalized): Secondary | ICD-10-CM | POA: Diagnosis not present

## 2020-10-18 DIAGNOSIS — N390 Urinary tract infection, site not specified: Secondary | ICD-10-CM | POA: Diagnosis not present

## 2020-10-18 DIAGNOSIS — M6281 Muscle weakness (generalized): Secondary | ICD-10-CM | POA: Diagnosis not present

## 2020-10-18 DIAGNOSIS — R2681 Unsteadiness on feet: Secondary | ICD-10-CM | POA: Diagnosis not present

## 2020-10-18 DIAGNOSIS — U071 COVID-19: Secondary | ICD-10-CM | POA: Diagnosis not present

## 2020-10-21 DIAGNOSIS — E785 Hyperlipidemia, unspecified: Secondary | ICD-10-CM | POA: Diagnosis not present

## 2020-10-21 DIAGNOSIS — I509 Heart failure, unspecified: Secondary | ICD-10-CM | POA: Diagnosis not present

## 2020-10-21 DIAGNOSIS — M6281 Muscle weakness (generalized): Secondary | ICD-10-CM | POA: Diagnosis not present

## 2020-10-21 DIAGNOSIS — R600 Localized edema: Secondary | ICD-10-CM | POA: Diagnosis not present

## 2020-10-21 DIAGNOSIS — U071 COVID-19: Secondary | ICD-10-CM | POA: Diagnosis not present

## 2020-10-21 DIAGNOSIS — S91119A Laceration without foreign body of unspecified toe without damage to nail, initial encounter: Secondary | ICD-10-CM | POA: Diagnosis not present

## 2020-10-21 DIAGNOSIS — N390 Urinary tract infection, site not specified: Secondary | ICD-10-CM | POA: Diagnosis not present

## 2020-10-21 DIAGNOSIS — R2681 Unsteadiness on feet: Secondary | ICD-10-CM | POA: Diagnosis not present

## 2020-10-24 DIAGNOSIS — R600 Localized edema: Secondary | ICD-10-CM | POA: Diagnosis not present

## 2020-10-24 DIAGNOSIS — E119 Type 2 diabetes mellitus without complications: Secondary | ICD-10-CM | POA: Diagnosis not present

## 2020-10-24 DIAGNOSIS — I509 Heart failure, unspecified: Secondary | ICD-10-CM | POA: Diagnosis not present

## 2020-10-24 DIAGNOSIS — E785 Hyperlipidemia, unspecified: Secondary | ICD-10-CM | POA: Diagnosis not present

## 2020-10-28 ENCOUNTER — Emergency Department (HOSPITAL_COMMUNITY)
Admission: EM | Admit: 2020-10-28 | Discharge: 2020-10-29 | Disposition: A | Discharge status: Home / Self Care | Payer: Medicare Other | Attending: Emergency Medicine | Admitting: Emergency Medicine

## 2020-10-28 ENCOUNTER — Other Ambulatory Visit: Payer: Self-pay

## 2020-10-28 ENCOUNTER — Emergency Department (HOSPITAL_COMMUNITY): Payer: Medicare Other

## 2020-10-28 DIAGNOSIS — R0789 Other chest pain: Secondary | ICD-10-CM | POA: Diagnosis not present

## 2020-10-28 DIAGNOSIS — R002 Palpitations: Secondary | ICD-10-CM | POA: Diagnosis not present

## 2020-10-28 DIAGNOSIS — R531 Weakness: Secondary | ICD-10-CM | POA: Insufficient documentation

## 2020-10-28 DIAGNOSIS — J9811 Atelectasis: Secondary | ICD-10-CM | POA: Diagnosis not present

## 2020-10-28 DIAGNOSIS — R079 Chest pain, unspecified: Secondary | ICD-10-CM | POA: Insufficient documentation

## 2020-10-28 DIAGNOSIS — Z5321 Procedure and treatment not carried out due to patient leaving prior to being seen by health care provider: Secondary | ICD-10-CM | POA: Diagnosis not present

## 2020-10-28 DIAGNOSIS — I4510 Unspecified right bundle-branch block: Secondary | ICD-10-CM | POA: Diagnosis not present

## 2020-10-28 LAB — BASIC METABOLIC PANEL
Anion gap: 14 (ref 5–15)
BUN: 16 mg/dL (ref 8–23)
CO2: 22 mmol/L (ref 22–32)
Calcium: 9.3 mg/dL (ref 8.9–10.3)
Chloride: 101 mmol/L (ref 98–111)
Creatinine, Ser: 1.17 mg/dL — ABNORMAL HIGH (ref 0.44–1.00)
GFR, Estimated: 52 mL/min — ABNORMAL LOW (ref >60)
Glucose, Bld: 205 mg/dL — ABNORMAL HIGH (ref 70–99)
Potassium: 3.9 mmol/L (ref 3.5–5.1)
Sodium: 137 mmol/L (ref 135–145)

## 2020-10-28 LAB — TROPONIN I (HIGH SENSITIVITY)
Troponin I (High Sensitivity): 11 ng/L (ref <18)
Troponin I (High Sensitivity): 11 ng/L (ref <18)

## 2020-10-28 LAB — CBC
HCT: 45.6 % (ref 36.0–46.0)
Hemoglobin: 14.6 g/dL (ref 12.0–15.0)
MCH: 26.4 pg (ref 26.0–34.0)
MCHC: 32 g/dL (ref 30.0–36.0)
MCV: 82.3 fL (ref 80.0–100.0)
Platelets: 175 10*3/uL (ref 150–400)
RBC: 5.54 MIL/uL — ABNORMAL HIGH (ref 3.87–5.11)
RDW: 15.2 % (ref 11.5–15.5)
WBC: 8.7 10*3/uL (ref 4.0–10.5)
nRBC: 0 % (ref 0.0–0.2)

## 2020-10-28 LAB — CBG MONITORING, ED: Glucose-Capillary: 141 mg/dL — ABNORMAL HIGH (ref 70–99)

## 2020-10-28 NOTE — ED Triage Notes (Signed)
Pt from [place] for intermittent weakness and chest pain x 3 days, worsening today with exertion. Staff reports hr 150 today with exertion and patient reports palpitations at that time. CP resolves with rest. Wears 2L O2 at home.

## 2020-10-28 NOTE — ED Notes (Addendum)
Pt left AMA °

## 2020-11-01 DIAGNOSIS — Z23 Encounter for immunization: Secondary | ICD-10-CM | POA: Diagnosis not present

## 2020-11-04 DIAGNOSIS — E119 Type 2 diabetes mellitus without complications: Secondary | ICD-10-CM | POA: Diagnosis not present

## 2020-11-04 DIAGNOSIS — I480 Paroxysmal atrial fibrillation: Secondary | ICD-10-CM | POA: Diagnosis not present

## 2020-11-04 DIAGNOSIS — R000 Tachycardia, unspecified: Secondary | ICD-10-CM | POA: Diagnosis not present

## 2020-11-04 DIAGNOSIS — I509 Heart failure, unspecified: Secondary | ICD-10-CM | POA: Diagnosis not present

## 2020-11-04 DIAGNOSIS — R079 Chest pain, unspecified: Secondary | ICD-10-CM | POA: Diagnosis not present

## 2020-11-05 ENCOUNTER — Encounter (HOSPITAL_COMMUNITY): Payer: Self-pay

## 2020-11-05 ENCOUNTER — Emergency Department (HOSPITAL_COMMUNITY): Payer: Medicare Other

## 2020-11-05 ENCOUNTER — Other Ambulatory Visit: Payer: Self-pay

## 2020-11-05 ENCOUNTER — Emergency Department (HOSPITAL_COMMUNITY)
Admission: EM | Admit: 2020-11-05 | Discharge: 2020-11-06 | Disposition: A | Discharge status: Skilled Nursing Facility | Payer: Medicare Other | Attending: Emergency Medicine | Admitting: Emergency Medicine

## 2020-11-05 DIAGNOSIS — I5022 Chronic systolic (congestive) heart failure: Secondary | ICD-10-CM | POA: Diagnosis not present

## 2020-11-05 DIAGNOSIS — I447 Left bundle-branch block, unspecified: Secondary | ICD-10-CM | POA: Diagnosis not present

## 2020-11-05 DIAGNOSIS — Z7982 Long term (current) use of aspirin: Secondary | ICD-10-CM | POA: Insufficient documentation

## 2020-11-05 DIAGNOSIS — Z79899 Other long term (current) drug therapy: Secondary | ICD-10-CM | POA: Insufficient documentation

## 2020-11-05 DIAGNOSIS — Z794 Long term (current) use of insulin: Secondary | ICD-10-CM | POA: Insufficient documentation

## 2020-11-05 DIAGNOSIS — Z87891 Personal history of nicotine dependence: Secondary | ICD-10-CM | POA: Insufficient documentation

## 2020-11-05 DIAGNOSIS — J449 Chronic obstructive pulmonary disease, unspecified: Secondary | ICD-10-CM | POA: Insufficient documentation

## 2020-11-05 DIAGNOSIS — E1142 Type 2 diabetes mellitus with diabetic polyneuropathy: Secondary | ICD-10-CM | POA: Insufficient documentation

## 2020-11-05 DIAGNOSIS — R072 Precordial pain: Secondary | ICD-10-CM | POA: Diagnosis not present

## 2020-11-05 DIAGNOSIS — R000 Tachycardia, unspecified: Secondary | ICD-10-CM | POA: Diagnosis not present

## 2020-11-05 DIAGNOSIS — I110 Hypertensive heart disease with heart failure: Secondary | ICD-10-CM | POA: Diagnosis not present

## 2020-11-05 DIAGNOSIS — R0789 Other chest pain: Secondary | ICD-10-CM | POA: Diagnosis not present

## 2020-11-05 DIAGNOSIS — J45998 Other asthma: Secondary | ICD-10-CM | POA: Diagnosis not present

## 2020-11-05 DIAGNOSIS — R079 Chest pain, unspecified: Secondary | ICD-10-CM

## 2020-11-05 LAB — CBC WITH DIFFERENTIAL/PLATELET
Abs Immature Granulocytes: 0.05 10*3/uL (ref 0.00–0.07)
Basophils Absolute: 0.1 10*3/uL (ref 0.0–0.1)
Basophils Relative: 1 %
Eosinophils Absolute: 0.1 10*3/uL (ref 0.0–0.5)
Eosinophils Relative: 2 %
HCT: 40.4 % (ref 36.0–46.0)
Hemoglobin: 13.1 g/dL (ref 12.0–15.0)
Immature Granulocytes: 1 %
Lymphocytes Relative: 22 %
Lymphs Abs: 1.8 10*3/uL (ref 0.7–4.0)
MCH: 26.8 pg (ref 26.0–34.0)
MCHC: 32.4 g/dL (ref 30.0–36.0)
MCV: 82.6 fL (ref 80.0–100.0)
Monocytes Absolute: 0.6 10*3/uL (ref 0.1–1.0)
Monocytes Relative: 8 %
Neutro Abs: 5.6 10*3/uL (ref 1.7–7.7)
Neutrophils Relative %: 66 %
Platelets: 162 10*3/uL (ref 150–400)
RBC: 4.89 MIL/uL (ref 3.87–5.11)
RDW: 15.5 % (ref 11.5–15.5)
WBC: 8.2 10*3/uL (ref 4.0–10.5)
nRBC: 0 % (ref 0.0–0.2)

## 2020-11-05 LAB — TROPONIN I (HIGH SENSITIVITY)
Troponin I (High Sensitivity): 10 ng/L (ref <18)
Troponin I (High Sensitivity): 10 ng/L (ref <18)

## 2020-11-05 LAB — COMPREHENSIVE METABOLIC PANEL
ALT: 19 U/L (ref 0–44)
AST: 21 U/L (ref 15–41)
Albumin: 3.3 g/dL — ABNORMAL LOW (ref 3.5–5.0)
Alkaline Phosphatase: 128 U/L — ABNORMAL HIGH (ref 38–126)
Anion gap: 8 (ref 5–15)
BUN: 17 mg/dL (ref 8–23)
CO2: 26 mmol/L (ref 22–32)
Calcium: 9.5 mg/dL (ref 8.9–10.3)
Chloride: 102 mmol/L (ref 98–111)
Creatinine, Ser: 1.11 mg/dL — ABNORMAL HIGH (ref 0.44–1.00)
GFR, Estimated: 56 mL/min — ABNORMAL LOW (ref >60)
Glucose, Bld: 143 mg/dL — ABNORMAL HIGH (ref 70–99)
Potassium: 4 mmol/L (ref 3.5–5.1)
Sodium: 136 mmol/L (ref 135–145)
Total Bilirubin: 0.4 mg/dL (ref 0.3–1.2)
Total Protein: 7 g/dL (ref 6.5–8.1)

## 2020-11-05 MED ORDER — TRAMADOL HCL 50 MG PO TABS
50.0000 mg | ORAL_TABLET | Freq: Once | ORAL | Status: AC
  Administered 2020-11-05: 50 mg via ORAL
  Filled 2020-11-05: qty 1

## 2020-11-05 NOTE — ED Triage Notes (Signed)
Patient brought in via ems from home. Patient c/o CP for 1 month however 4 days ago turned into pressure. Patient A&Ox4, known LBBB, VSS. 4 ASA given

## 2020-11-05 NOTE — ED Notes (Signed)
O2 tank changed
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
## 2020-11-05 NOTE — Discharge Instructions (Signed)
Return for any problem.  Follow-up with your regular care provider as instructed. °

## 2020-11-05 NOTE — ED Notes (Signed)
5th on PTAR list  

## 2020-11-06 DIAGNOSIS — E559 Vitamin D deficiency, unspecified: Secondary | ICD-10-CM | POA: Diagnosis not present

## 2020-11-06 DIAGNOSIS — I5043 Acute on chronic combined systolic (congestive) and diastolic (congestive) heart failure: Secondary | ICD-10-CM | POA: Diagnosis not present

## 2020-11-06 DIAGNOSIS — I509 Heart failure, unspecified: Secondary | ICD-10-CM | POA: Diagnosis not present

## 2020-11-06 DIAGNOSIS — I10 Essential (primary) hypertension: Secondary | ICD-10-CM | POA: Diagnosis not present

## 2020-11-06 DIAGNOSIS — E119 Type 2 diabetes mellitus without complications: Secondary | ICD-10-CM | POA: Diagnosis not present

## 2020-11-06 DIAGNOSIS — I471 Supraventricular tachycardia: Secondary | ICD-10-CM | POA: Diagnosis not present

## 2020-11-06 DIAGNOSIS — D510 Vitamin B12 deficiency anemia due to intrinsic factor deficiency: Secondary | ICD-10-CM | POA: Diagnosis not present

## 2020-11-13 ENCOUNTER — Ambulatory Visit (INDEPENDENT_AMBULATORY_CARE_PROVIDER_SITE_OTHER): Payer: Medicare Other | Admitting: Cardiology

## 2020-11-13 ENCOUNTER — Other Ambulatory Visit: Payer: Self-pay

## 2020-11-13 ENCOUNTER — Encounter: Payer: Self-pay | Admitting: Cardiology

## 2020-11-13 VITALS — BP 114/62 | HR 61 | Ht 61.0 in | Wt 271.0 lb

## 2020-11-13 DIAGNOSIS — I447 Left bundle-branch block, unspecified: Secondary | ICD-10-CM

## 2020-11-13 DIAGNOSIS — I5032 Chronic diastolic (congestive) heart failure: Secondary | ICD-10-CM

## 2020-11-13 MED ORDER — TORSEMIDE 20 MG PO TABS: 20.0000 mg | ORAL_TABLET | Freq: Every day | ORAL | 2 refills | Status: AC

## 2020-11-13 NOTE — Patient Instructions (Addendum)
Medication Instructions:  Your physician has recommended you make the following change in your medication: STOP Lasix START Torsemide 40 mg twice daily  *If you need a refill on your cardiac medications before your next appointment, please call your pharmacy*   Lab Work: None ordered   Testing/Procedures: Your physician has requested that you have an echocardiogram. Echocardiography is a painless test that uses sound waves to create images of your heart. It provides your doctor with information about the size and shape of your heart and how well your heart's chambers and valves are working. This procedure takes approximately one hour. There are no restrictions for this procedure.   Follow-Up: At [facility], you and your health needs are our priority.  As part of our continuing mission to provide you with exceptional heart care, we have created designated Provider Care Teams.  These Care Teams include your primary Cardiologist (physician) and Advanced Practice Providers (APPs -  Physician Assistants and Nurse Practitioners) who all work together to provide you with the care you need, when you need it.  Your next appointment:   2 week(s)  The format for your next appointment:   In Person  Provider:   To establish with general cardiology .  Can be with an APP or MD    Thank you for choosing [facility]!!     Other Instructions
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
## 2020-11-18 DIAGNOSIS — G4700 Insomnia, unspecified: Secondary | ICD-10-CM | POA: Diagnosis not present

## 2020-11-18 DIAGNOSIS — E1165 Type 2 diabetes mellitus with hyperglycemia: Secondary | ICD-10-CM | POA: Diagnosis not present

## 2020-11-18 DIAGNOSIS — R0602 Shortness of breath: Secondary | ICD-10-CM | POA: Diagnosis not present

## 2020-11-18 DIAGNOSIS — E119 Type 2 diabetes mellitus without complications: Secondary | ICD-10-CM | POA: Diagnosis not present

## 2020-11-19 ENCOUNTER — Encounter (HOSPITAL_COMMUNITY): Payer: Self-pay | Admitting: Cardiology

## 2020-11-19 DIAGNOSIS — Z23 Encounter for immunization: Secondary | ICD-10-CM | POA: Diagnosis not present

## 2020-11-26 DIAGNOSIS — I5043 Acute on chronic combined systolic (congestive) and diastolic (congestive) heart failure: Secondary | ICD-10-CM | POA: Diagnosis not present

## 2020-11-26 DIAGNOSIS — I509 Heart failure, unspecified: Secondary | ICD-10-CM | POA: Diagnosis not present

## 2020-11-26 DIAGNOSIS — R0602 Shortness of breath: Secondary | ICD-10-CM | POA: Diagnosis not present

## 2020-11-26 DIAGNOSIS — G4700 Insomnia, unspecified: Secondary | ICD-10-CM | POA: Diagnosis not present
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
## 2020-12-06 ENCOUNTER — Ambulatory Visit: Payer: Medicare Other | Admitting: Internal Medicine

## 2020-12-06 DIAGNOSIS — I5032 Chronic diastolic (congestive) heart failure: Secondary | ICD-10-CM

## 2020-12-26 DIAGNOSIS — G4700 Insomnia, unspecified: Secondary | ICD-10-CM | POA: Diagnosis not present

## 2020-12-26 DIAGNOSIS — E1165 Type 2 diabetes mellitus with hyperglycemia: Secondary | ICD-10-CM | POA: Diagnosis not present

## 2020-12-26 DIAGNOSIS — I509 Heart failure, unspecified: Secondary | ICD-10-CM | POA: Diagnosis not present

## 2020-12-26 DIAGNOSIS — F329 Major depressive disorder, single episode, unspecified: Secondary | ICD-10-CM | POA: Diagnosis not present

## 2021-01-13 DIAGNOSIS — F3162 Bipolar disorder, current episode mixed, moderate: Secondary | ICD-10-CM | POA: Diagnosis not present

## 2021-01-13 DIAGNOSIS — G4701 Insomnia due to medical condition: Secondary | ICD-10-CM | POA: Diagnosis not present

## 2021-01-16 DIAGNOSIS — E1165 Type 2 diabetes mellitus with hyperglycemia: Secondary | ICD-10-CM | POA: Diagnosis not present

## 2021-01-16 DIAGNOSIS — G4700 Insomnia, unspecified: Secondary | ICD-10-CM | POA: Diagnosis not present

## 2021-01-16 DIAGNOSIS — H9201 Otalgia, right ear: Secondary | ICD-10-CM | POA: Diagnosis not present

## 2021-01-16 DIAGNOSIS — I509 Heart failure, unspecified: Secondary | ICD-10-CM | POA: Diagnosis not present

## 2021-01-17 DIAGNOSIS — E785 Hyperlipidemia, unspecified: Secondary | ICD-10-CM | POA: Diagnosis not present

## 2021-01-17 DIAGNOSIS — I10 Essential (primary) hypertension: Secondary | ICD-10-CM | POA: Diagnosis not present

## 2021-01-17 DIAGNOSIS — E119 Type 2 diabetes mellitus without complications: Secondary | ICD-10-CM | POA: Diagnosis not present

## 2021-01-17 DIAGNOSIS — I509 Heart failure, unspecified: Secondary | ICD-10-CM | POA: Diagnosis not present

## 2021-02-06 DIAGNOSIS — J42 Unspecified chronic bronchitis: Secondary | ICD-10-CM | POA: Diagnosis not present

## 2021-02-06 DIAGNOSIS — J209 Acute bronchitis, unspecified: Secondary | ICD-10-CM | POA: Diagnosis not present

## 2021-02-14 DIAGNOSIS — J209 Acute bronchitis, unspecified: Secondary | ICD-10-CM | POA: Diagnosis not present

## 2021-02-14 DIAGNOSIS — B379 Candidiasis, unspecified: Secondary | ICD-10-CM | POA: Diagnosis not present

## 2021-02-14 DIAGNOSIS — R600 Localized edema: Secondary | ICD-10-CM | POA: Diagnosis not present

## 2021-02-14 DIAGNOSIS — E1165 Type 2 diabetes mellitus with hyperglycemia: Secondary | ICD-10-CM | POA: Diagnosis not present

## 2021-03-06 DIAGNOSIS — E1165 Type 2 diabetes mellitus with hyperglycemia: Secondary | ICD-10-CM | POA: Diagnosis not present

## 2021-03-06 DIAGNOSIS — B379 Candidiasis, unspecified: Secondary | ICD-10-CM | POA: Diagnosis not present

## 2021-03-06 DIAGNOSIS — J209 Acute bronchitis, unspecified: Secondary | ICD-10-CM | POA: Diagnosis not present

## 2021-03-06 DIAGNOSIS — U071 COVID-19: Secondary | ICD-10-CM | POA: Diagnosis not present

## 2021-03-28 DIAGNOSIS — E785 Hyperlipidemia, unspecified: Secondary | ICD-10-CM | POA: Diagnosis not present

## 2021-03-28 DIAGNOSIS — I480 Paroxysmal atrial fibrillation: Secondary | ICD-10-CM | POA: Diagnosis not present

## 2021-03-28 DIAGNOSIS — I509 Heart failure, unspecified: Secondary | ICD-10-CM | POA: Diagnosis not present

## 2021-03-28 DIAGNOSIS — N319 Neuromuscular dysfunction of bladder, unspecified: Secondary | ICD-10-CM | POA: Diagnosis not present

## 2021-04-03 DIAGNOSIS — I480 Paroxysmal atrial fibrillation: Secondary | ICD-10-CM | POA: Diagnosis not present

## 2021-04-03 DIAGNOSIS — R5381 Other malaise: Secondary | ICD-10-CM | POA: Diagnosis not present

## 2021-04-03 DIAGNOSIS — I509 Heart failure, unspecified: Secondary | ICD-10-CM | POA: Diagnosis not present

## 2021-04-03 DIAGNOSIS — E785 Hyperlipidemia, unspecified: Secondary | ICD-10-CM | POA: Diagnosis not present

## 2021-04-08 DIAGNOSIS — E538 Deficiency of other specified B group vitamins: Secondary | ICD-10-CM | POA: Diagnosis not present

## 2021-04-08 DIAGNOSIS — E119 Type 2 diabetes mellitus without complications: Secondary | ICD-10-CM | POA: Diagnosis not present

## 2021-04-08 DIAGNOSIS — E559 Vitamin D deficiency, unspecified: Secondary | ICD-10-CM | POA: Diagnosis not present

## 2021-04-08 DIAGNOSIS — D538 Other specified nutritional anemias: Secondary | ICD-10-CM | POA: Diagnosis not present

## 2021-04-10 DIAGNOSIS — E559 Vitamin D deficiency, unspecified: Secondary | ICD-10-CM | POA: Diagnosis not present

## 2021-04-10 DIAGNOSIS — E119 Type 2 diabetes mellitus without complications: Secondary | ICD-10-CM | POA: Diagnosis not present

## 2021-04-10 DIAGNOSIS — D538 Other specified nutritional anemias: Secondary | ICD-10-CM | POA: Diagnosis not present

## 2021-04-10 DIAGNOSIS — E538 Deficiency of other specified B group vitamins: Secondary | ICD-10-CM | POA: Diagnosis not present

## 2021-04-17 DIAGNOSIS — R338 Other retention of urine: Secondary | ICD-10-CM | POA: Diagnosis not present

## 2021-04-17 DIAGNOSIS — N3020 Other chronic cystitis without hematuria: Secondary | ICD-10-CM | POA: Diagnosis not present

## 2021-04-22 DIAGNOSIS — I110 Hypertensive heart disease with heart failure: Secondary | ICD-10-CM | POA: Diagnosis not present

## 2021-04-22 DIAGNOSIS — J449 Chronic obstructive pulmonary disease, unspecified: Secondary | ICD-10-CM | POA: Diagnosis not present

## 2021-04-22 DIAGNOSIS — E0840 Diabetes mellitus due to underlying condition with diabetic neuropathy, unspecified: Secondary | ICD-10-CM | POA: Diagnosis not present

## 2021-04-25 DIAGNOSIS — E119 Type 2 diabetes mellitus without complications: Secondary | ICD-10-CM | POA: Diagnosis not present

## 2021-04-25 DIAGNOSIS — E785 Hyperlipidemia, unspecified: Secondary | ICD-10-CM | POA: Diagnosis not present

## 2021-04-25 DIAGNOSIS — I509 Heart failure, unspecified: Secondary | ICD-10-CM | POA: Diagnosis not present

## 2021-04-25 DIAGNOSIS — I480 Paroxysmal atrial fibrillation: Secondary | ICD-10-CM | POA: Diagnosis not present

## 2021-05-20 DIAGNOSIS — E119 Type 2 diabetes mellitus without complications: Secondary | ICD-10-CM | POA: Diagnosis not present

## 2021-05-20 DIAGNOSIS — I5040 Unspecified combined systolic (congestive) and diastolic (congestive) heart failure: Secondary | ICD-10-CM | POA: Diagnosis not present

## 2021-05-20 DIAGNOSIS — I10 Essential (primary) hypertension: Secondary | ICD-10-CM | POA: Diagnosis not present

## 2021-05-20 DIAGNOSIS — E118 Type 2 diabetes mellitus with unspecified complications: Secondary | ICD-10-CM | POA: Diagnosis not present

## 2021-05-20 DIAGNOSIS — E785 Hyperlipidemia, unspecified: Secondary | ICD-10-CM | POA: Diagnosis not present

## 2021-05-20 DIAGNOSIS — I509 Heart failure, unspecified: Secondary | ICD-10-CM | POA: Diagnosis not present

## 2021-05-21 DIAGNOSIS — M1711 Unilateral primary osteoarthritis, right knee: Secondary | ICD-10-CM | POA: Diagnosis not present

## 2021-05-22 DIAGNOSIS — N319 Neuromuscular dysfunction of bladder, unspecified: Secondary | ICD-10-CM | POA: Diagnosis not present

## 2021-05-22 DIAGNOSIS — I480 Paroxysmal atrial fibrillation: Secondary | ICD-10-CM | POA: Diagnosis not present

## 2021-05-22 DIAGNOSIS — I509 Heart failure, unspecified: Secondary | ICD-10-CM | POA: Diagnosis not present

## 2021-05-22 DIAGNOSIS — E119 Type 2 diabetes mellitus without complications: Secondary | ICD-10-CM | POA: Diagnosis not present

## 2021-05-23 DIAGNOSIS — N390 Urinary tract infection, site not specified: Secondary | ICD-10-CM | POA: Diagnosis not present

## 2021-05-26 DIAGNOSIS — N390 Urinary tract infection, site not specified: Secondary | ICD-10-CM | POA: Diagnosis not present

## 2021-06-16 DIAGNOSIS — E0840 Diabetes mellitus due to underlying condition with diabetic neuropathy, unspecified: Secondary | ICD-10-CM | POA: Diagnosis not present

## 2021-06-16 DIAGNOSIS — I5040 Unspecified combined systolic (congestive) and diastolic (congestive) heart failure: Secondary | ICD-10-CM | POA: Diagnosis not present

## 2021-06-19 DIAGNOSIS — E119 Type 2 diabetes mellitus without complications: Secondary | ICD-10-CM | POA: Diagnosis not present

## 2021-06-19 DIAGNOSIS — E785 Hyperlipidemia, unspecified: Secondary | ICD-10-CM | POA: Diagnosis not present

## 2021-06-19 DIAGNOSIS — I480 Paroxysmal atrial fibrillation: Secondary | ICD-10-CM | POA: Diagnosis not present

## 2021-06-19 DIAGNOSIS — I509 Heart failure, unspecified: Secondary | ICD-10-CM | POA: Diagnosis not present

## 2021-06-24 DIAGNOSIS — I480 Paroxysmal atrial fibrillation: Secondary | ICD-10-CM | POA: Diagnosis not present

## 2021-06-24 DIAGNOSIS — E119 Type 2 diabetes mellitus without complications: Secondary | ICD-10-CM | POA: Diagnosis not present

## 2021-06-24 DIAGNOSIS — N390 Urinary tract infection, site not specified: Secondary | ICD-10-CM | POA: Diagnosis not present

## 2021-06-24 DIAGNOSIS — I5020 Unspecified systolic (congestive) heart failure: Secondary | ICD-10-CM | POA: Diagnosis not present

## 2021-06-24 DIAGNOSIS — I10 Essential (primary) hypertension: Secondary | ICD-10-CM | POA: Diagnosis not present

## 2021-06-24 DIAGNOSIS — I5023 Acute on chronic systolic (congestive) heart failure: Secondary | ICD-10-CM | POA: Diagnosis not present

## 2021-06-26 DIAGNOSIS — N319 Neuromuscular dysfunction of bladder, unspecified: Secondary | ICD-10-CM | POA: Diagnosis not present

## 2021-06-26 DIAGNOSIS — I509 Heart failure, unspecified: Secondary | ICD-10-CM | POA: Diagnosis not present

## 2021-06-26 DIAGNOSIS — E119 Type 2 diabetes mellitus without complications: Secondary | ICD-10-CM | POA: Diagnosis not present

## 2021-06-26 DIAGNOSIS — I480 Paroxysmal atrial fibrillation: Secondary | ICD-10-CM | POA: Diagnosis not present

## 2021-07-04 DIAGNOSIS — I5020 Unspecified systolic (congestive) heart failure: Secondary | ICD-10-CM | POA: Diagnosis not present

## 2021-07-11 DIAGNOSIS — I10 Essential (primary) hypertension: Secondary | ICD-10-CM | POA: Diagnosis not present

## 2021-07-11 DIAGNOSIS — E119 Type 2 diabetes mellitus without complications: Secondary | ICD-10-CM | POA: Diagnosis not present

## 2021-07-11 DIAGNOSIS — I5020 Unspecified systolic (congestive) heart failure: Secondary | ICD-10-CM | POA: Diagnosis not present

## 2021-07-17 DIAGNOSIS — N319 Neuromuscular dysfunction of bladder, unspecified: Secondary | ICD-10-CM | POA: Diagnosis not present

## 2021-07-17 DIAGNOSIS — I480 Paroxysmal atrial fibrillation: Secondary | ICD-10-CM | POA: Diagnosis not present

## 2021-07-17 DIAGNOSIS — E119 Type 2 diabetes mellitus without complications: Secondary | ICD-10-CM | POA: Diagnosis not present

## 2021-07-17 DIAGNOSIS — I509 Heart failure, unspecified: Secondary | ICD-10-CM | POA: Diagnosis not present

## 2021-07-22 DIAGNOSIS — E118 Type 2 diabetes mellitus with unspecified complications: Secondary | ICD-10-CM | POA: Diagnosis not present

## 2021-07-22 DIAGNOSIS — J449 Chronic obstructive pulmonary disease, unspecified: Secondary | ICD-10-CM | POA: Diagnosis not present

## 2021-07-28 DIAGNOSIS — G4701 Insomnia due to medical condition: Secondary | ICD-10-CM | POA: Diagnosis not present

## 2021-07-28 DIAGNOSIS — F3162 Bipolar disorder, current episode mixed, moderate: Secondary | ICD-10-CM | POA: Diagnosis not present

## 2021-08-04 DIAGNOSIS — E119 Type 2 diabetes mellitus without complications: Secondary | ICD-10-CM | POA: Diagnosis not present

## 2021-08-04 DIAGNOSIS — E785 Hyperlipidemia, unspecified: Secondary | ICD-10-CM | POA: Diagnosis not present

## 2021-08-04 DIAGNOSIS — I10 Essential (primary) hypertension: Secondary | ICD-10-CM | POA: Diagnosis not present

## 2021-08-04 DIAGNOSIS — N319 Neuromuscular dysfunction of bladder, unspecified: Secondary | ICD-10-CM | POA: Diagnosis not present

## 2021-08-14 DIAGNOSIS — I739 Peripheral vascular disease, unspecified: Secondary | ICD-10-CM | POA: Diagnosis not present

## 2021-08-14 DIAGNOSIS — L603 Nail dystrophy: Secondary | ICD-10-CM | POA: Diagnosis not present

## 2021-08-14 DIAGNOSIS — B351 Tinea unguium: Secondary | ICD-10-CM | POA: Diagnosis not present

## 2021-08-18 DIAGNOSIS — F3162 Bipolar disorder, current episode mixed, moderate: Secondary | ICD-10-CM | POA: Diagnosis not present

## 2021-08-18 DIAGNOSIS — G4701 Insomnia due to medical condition: Secondary | ICD-10-CM | POA: Diagnosis not present

## 2021-08-28 DIAGNOSIS — I10 Essential (primary) hypertension: Secondary | ICD-10-CM | POA: Diagnosis not present

## 2021-08-28 DIAGNOSIS — E119 Type 2 diabetes mellitus without complications: Secondary | ICD-10-CM | POA: Diagnosis not present

## 2021-08-28 DIAGNOSIS — N319 Neuromuscular dysfunction of bladder, unspecified: Secondary | ICD-10-CM | POA: Diagnosis not present

## 2021-08-28 DIAGNOSIS — E785 Hyperlipidemia, unspecified: Secondary | ICD-10-CM | POA: Diagnosis not present

## 2021-09-01 DIAGNOSIS — G4701 Insomnia due to medical condition: Secondary | ICD-10-CM | POA: Diagnosis not present

## 2021-09-03 DIAGNOSIS — I10 Essential (primary) hypertension: Secondary | ICD-10-CM | POA: Diagnosis not present

## 2021-09-03 DIAGNOSIS — E785 Hyperlipidemia, unspecified: Secondary | ICD-10-CM | POA: Diagnosis not present

## 2021-09-03 DIAGNOSIS — E119 Type 2 diabetes mellitus without complications: Secondary | ICD-10-CM | POA: Diagnosis not present

## 2021-09-03 DIAGNOSIS — I480 Paroxysmal atrial fibrillation: Secondary | ICD-10-CM | POA: Diagnosis not present

## 2021-09-15 DIAGNOSIS — G4701 Insomnia due to medical condition: Secondary | ICD-10-CM | POA: Diagnosis not present

## 2021-09-15 DIAGNOSIS — F3162 Bipolar disorder, current episode mixed, moderate: Secondary | ICD-10-CM | POA: Diagnosis not present

## 2021-09-16 DIAGNOSIS — I5040 Unspecified combined systolic (congestive) and diastolic (congestive) heart failure: Secondary | ICD-10-CM | POA: Diagnosis not present

## 2021-09-16 DIAGNOSIS — M47816 Spondylosis without myelopathy or radiculopathy, lumbar region: Secondary | ICD-10-CM | POA: Diagnosis not present

## 2021-09-16 DIAGNOSIS — M6281 Muscle weakness (generalized): Secondary | ICD-10-CM | POA: Diagnosis not present

## 2021-09-16 DIAGNOSIS — S46812A Strain of other muscles, fascia and tendons at shoulder and upper arm level, left arm, initial encounter: Secondary | ICD-10-CM | POA: Diagnosis not present

## 2021-09-16 DIAGNOSIS — R2689 Other abnormalities of gait and mobility: Secondary | ICD-10-CM | POA: Diagnosis not present

## 2021-09-17 DIAGNOSIS — S46812A Strain of other muscles, fascia and tendons at shoulder and upper arm level, left arm, initial encounter: Secondary | ICD-10-CM | POA: Diagnosis not present

## 2021-09-17 DIAGNOSIS — M6281 Muscle weakness (generalized): Secondary | ICD-10-CM | POA: Diagnosis not present

## 2021-09-17 DIAGNOSIS — M47816 Spondylosis without myelopathy or radiculopathy, lumbar region: Secondary | ICD-10-CM | POA: Diagnosis not present

## 2021-09-17 DIAGNOSIS — I5040 Unspecified combined systolic (congestive) and diastolic (congestive) heart failure: Secondary | ICD-10-CM | POA: Diagnosis not present

## 2021-09-17 DIAGNOSIS — R2689 Other abnormalities of gait and mobility: Secondary | ICD-10-CM | POA: Diagnosis not present

## 2021-09-18 DIAGNOSIS — S46812A Strain of other muscles, fascia and tendons at shoulder and upper arm level, left arm, initial encounter: Secondary | ICD-10-CM | POA: Diagnosis not present

## 2021-09-18 DIAGNOSIS — R2689 Other abnormalities of gait and mobility: Secondary | ICD-10-CM | POA: Diagnosis not present

## 2021-09-18 DIAGNOSIS — I5040 Unspecified combined systolic (congestive) and diastolic (congestive) heart failure: Secondary | ICD-10-CM | POA: Diagnosis not present

## 2021-09-18 DIAGNOSIS — M47816 Spondylosis without myelopathy or radiculopathy, lumbar region: Secondary | ICD-10-CM | POA: Diagnosis not present

## 2021-09-18 DIAGNOSIS — M6281 Muscle weakness (generalized): Secondary | ICD-10-CM | POA: Diagnosis not present

## 2021-09-19 ENCOUNTER — Other Ambulatory Visit (HOSPITAL_COMMUNITY): Payer: Self-pay | Admitting: Family Medicine

## 2021-09-19 ENCOUNTER — Other Ambulatory Visit: Payer: Self-pay | Admitting: Family Medicine

## 2021-09-19 DIAGNOSIS — R2689 Other abnormalities of gait and mobility: Secondary | ICD-10-CM | POA: Diagnosis not present

## 2021-09-19 DIAGNOSIS — S46812A Strain of other muscles, fascia and tendons at shoulder and upper arm level, left arm, initial encounter: Secondary | ICD-10-CM | POA: Diagnosis not present

## 2021-09-19 DIAGNOSIS — M6281 Muscle weakness (generalized): Secondary | ICD-10-CM | POA: Diagnosis not present

## 2021-09-19 DIAGNOSIS — R109 Unspecified abdominal pain: Secondary | ICD-10-CM

## 2021-09-19 DIAGNOSIS — I5040 Unspecified combined systolic (congestive) and diastolic (congestive) heart failure: Secondary | ICD-10-CM | POA: Diagnosis not present

## 2021-09-19 DIAGNOSIS — M47816 Spondylosis without myelopathy or radiculopathy, lumbar region: Secondary | ICD-10-CM | POA: Diagnosis not present

## 2021-09-22 DIAGNOSIS — M47816 Spondylosis without myelopathy or radiculopathy, lumbar region: Secondary | ICD-10-CM | POA: Diagnosis not present

## 2021-09-22 DIAGNOSIS — S46812A Strain of other muscles, fascia and tendons at shoulder and upper arm level, left arm, initial encounter: Secondary | ICD-10-CM | POA: Diagnosis not present

## 2021-09-22 DIAGNOSIS — I5040 Unspecified combined systolic (congestive) and diastolic (congestive) heart failure: Secondary | ICD-10-CM | POA: Diagnosis not present

## 2021-09-22 DIAGNOSIS — M6281 Muscle weakness (generalized): Secondary | ICD-10-CM | POA: Diagnosis not present

## 2021-09-22 DIAGNOSIS — R2689 Other abnormalities of gait and mobility: Secondary | ICD-10-CM | POA: Diagnosis not present

## 2021-09-23 DIAGNOSIS — I5040 Unspecified combined systolic (congestive) and diastolic (congestive) heart failure: Secondary | ICD-10-CM | POA: Diagnosis not present

## 2021-09-23 DIAGNOSIS — R2689 Other abnormalities of gait and mobility: Secondary | ICD-10-CM | POA: Diagnosis not present

## 2021-09-23 DIAGNOSIS — M47816 Spondylosis without myelopathy or radiculopathy, lumbar region: Secondary | ICD-10-CM | POA: Diagnosis not present

## 2021-09-23 DIAGNOSIS — M6281 Muscle weakness (generalized): Secondary | ICD-10-CM | POA: Diagnosis not present

## 2021-09-23 DIAGNOSIS — S46812A Strain of other muscles, fascia and tendons at shoulder and upper arm level, left arm, initial encounter: Secondary | ICD-10-CM | POA: Diagnosis not present

## 2021-09-24 DIAGNOSIS — S46812A Strain of other muscles, fascia and tendons at shoulder and upper arm level, left arm, initial encounter: Secondary | ICD-10-CM | POA: Diagnosis not present

## 2021-09-24 DIAGNOSIS — I5040 Unspecified combined systolic (congestive) and diastolic (congestive) heart failure: Secondary | ICD-10-CM | POA: Diagnosis not present

## 2021-09-24 DIAGNOSIS — M6281 Muscle weakness (generalized): Secondary | ICD-10-CM | POA: Diagnosis not present

## 2021-09-24 DIAGNOSIS — M47816 Spondylosis without myelopathy or radiculopathy, lumbar region: Secondary | ICD-10-CM | POA: Diagnosis not present

## 2021-09-24 DIAGNOSIS — R2689 Other abnormalities of gait and mobility: Secondary | ICD-10-CM | POA: Diagnosis not present

## 2021-09-25 DIAGNOSIS — E119 Type 2 diabetes mellitus without complications: Secondary | ICD-10-CM | POA: Diagnosis not present

## 2021-09-25 DIAGNOSIS — E785 Hyperlipidemia, unspecified: Secondary | ICD-10-CM | POA: Diagnosis not present

## 2021-09-25 DIAGNOSIS — I10 Essential (primary) hypertension: Secondary | ICD-10-CM | POA: Diagnosis not present

## 2021-09-25 DIAGNOSIS — I480 Paroxysmal atrial fibrillation: Secondary | ICD-10-CM | POA: Diagnosis not present

## 2021-09-27 DIAGNOSIS — M6281 Muscle weakness (generalized): Secondary | ICD-10-CM | POA: Diagnosis not present

## 2021-09-27 DIAGNOSIS — I5040 Unspecified combined systolic (congestive) and diastolic (congestive) heart failure: Secondary | ICD-10-CM | POA: Diagnosis not present

## 2021-09-27 DIAGNOSIS — M47816 Spondylosis without myelopathy or radiculopathy, lumbar region: Secondary | ICD-10-CM | POA: Diagnosis not present

## 2021-09-27 DIAGNOSIS — S46812A Strain of other muscles, fascia and tendons at shoulder and upper arm level, left arm, initial encounter: Secondary | ICD-10-CM | POA: Diagnosis not present

## 2021-09-27 DIAGNOSIS — R2689 Other abnormalities of gait and mobility: Secondary | ICD-10-CM | POA: Diagnosis not present

## 2021-09-28 ENCOUNTER — Ambulatory Visit (HOSPITAL_COMMUNITY): Payer: Medicare Other

## 2021-09-29 DIAGNOSIS — M47816 Spondylosis without myelopathy or radiculopathy, lumbar region: Secondary | ICD-10-CM | POA: Diagnosis not present

## 2021-09-29 DIAGNOSIS — S46812A Strain of other muscles, fascia and tendons at shoulder and upper arm level, left arm, initial encounter: Secondary | ICD-10-CM | POA: Diagnosis not present

## 2021-09-29 DIAGNOSIS — R2689 Other abnormalities of gait and mobility: Secondary | ICD-10-CM | POA: Diagnosis not present

## 2021-09-29 DIAGNOSIS — I5040 Unspecified combined systolic (congestive) and diastolic (congestive) heart failure: Secondary | ICD-10-CM | POA: Diagnosis not present

## 2021-09-29 DIAGNOSIS — M6281 Muscle weakness (generalized): Secondary | ICD-10-CM | POA: Diagnosis not present

## 2021-09-30 ENCOUNTER — Other Ambulatory Visit (HOSPITAL_COMMUNITY): Payer: Medicare Other

## 2021-09-30 DIAGNOSIS — M6281 Muscle weakness (generalized): Secondary | ICD-10-CM | POA: Diagnosis not present

## 2021-09-30 DIAGNOSIS — M47816 Spondylosis without myelopathy or radiculopathy, lumbar region: Secondary | ICD-10-CM | POA: Diagnosis not present

## 2021-09-30 DIAGNOSIS — R110 Nausea: Secondary | ICD-10-CM | POA: Diagnosis not present

## 2021-09-30 DIAGNOSIS — R2689 Other abnormalities of gait and mobility: Secondary | ICD-10-CM | POA: Diagnosis not present

## 2021-09-30 DIAGNOSIS — I5040 Unspecified combined systolic (congestive) and diastolic (congestive) heart failure: Secondary | ICD-10-CM | POA: Diagnosis not present

## 2021-09-30 DIAGNOSIS — S46812A Strain of other muscles, fascia and tendons at shoulder and upper arm level, left arm, initial encounter: Secondary | ICD-10-CM | POA: Diagnosis not present

## 2021-09-30 DIAGNOSIS — R1084 Generalized abdominal pain: Secondary | ICD-10-CM | POA: Diagnosis not present

## 2021-10-01 DIAGNOSIS — M6281 Muscle weakness (generalized): Secondary | ICD-10-CM | POA: Diagnosis not present

## 2021-10-01 DIAGNOSIS — S46812A Strain of other muscles, fascia and tendons at shoulder and upper arm level, left arm, initial encounter: Secondary | ICD-10-CM | POA: Diagnosis not present

## 2021-10-01 DIAGNOSIS — M47816 Spondylosis without myelopathy or radiculopathy, lumbar region: Secondary | ICD-10-CM | POA: Diagnosis not present

## 2021-10-01 DIAGNOSIS — E119 Type 2 diabetes mellitus without complications: Secondary | ICD-10-CM | POA: Diagnosis not present

## 2021-10-01 DIAGNOSIS — R2689 Other abnormalities of gait and mobility: Secondary | ICD-10-CM | POA: Diagnosis not present

## 2021-10-01 DIAGNOSIS — I5040 Unspecified combined systolic (congestive) and diastolic (congestive) heart failure: Secondary | ICD-10-CM | POA: Diagnosis not present

## 2021-10-01 DIAGNOSIS — E785 Hyperlipidemia, unspecified: Secondary | ICD-10-CM | POA: Diagnosis not present

## 2021-10-01 DIAGNOSIS — I10 Essential (primary) hypertension: Secondary | ICD-10-CM | POA: Diagnosis not present

## 2021-10-01 DIAGNOSIS — I480 Paroxysmal atrial fibrillation: Secondary | ICD-10-CM | POA: Diagnosis not present

## 2021-10-02 DIAGNOSIS — R2689 Other abnormalities of gait and mobility: Secondary | ICD-10-CM | POA: Diagnosis not present

## 2021-10-02 DIAGNOSIS — M6281 Muscle weakness (generalized): Secondary | ICD-10-CM | POA: Diagnosis not present

## 2021-10-02 DIAGNOSIS — M47816 Spondylosis without myelopathy or radiculopathy, lumbar region: Secondary | ICD-10-CM | POA: Diagnosis not present

## 2021-10-02 DIAGNOSIS — I5040 Unspecified combined systolic (congestive) and diastolic (congestive) heart failure: Secondary | ICD-10-CM | POA: Diagnosis not present

## 2021-10-02 DIAGNOSIS — S46812A Strain of other muscles, fascia and tendons at shoulder and upper arm level, left arm, initial encounter: Secondary | ICD-10-CM | POA: Diagnosis not present

## 2021-10-03 DIAGNOSIS — S46812A Strain of other muscles, fascia and tendons at shoulder and upper arm level, left arm, initial encounter: Secondary | ICD-10-CM | POA: Diagnosis not present

## 2021-10-03 DIAGNOSIS — M47816 Spondylosis without myelopathy or radiculopathy, lumbar region: Secondary | ICD-10-CM | POA: Diagnosis not present

## 2021-10-03 DIAGNOSIS — R2689 Other abnormalities of gait and mobility: Secondary | ICD-10-CM | POA: Diagnosis not present

## 2021-10-03 DIAGNOSIS — M6281 Muscle weakness (generalized): Secondary | ICD-10-CM | POA: Diagnosis not present

## 2021-10-03 DIAGNOSIS — I5040 Unspecified combined systolic (congestive) and diastolic (congestive) heart failure: Secondary | ICD-10-CM | POA: Diagnosis not present

## 2021-10-05 DIAGNOSIS — S46812A Strain of other muscles, fascia and tendons at shoulder and upper arm level, left arm, initial encounter: Secondary | ICD-10-CM | POA: Diagnosis not present

## 2021-10-05 DIAGNOSIS — R2689 Other abnormalities of gait and mobility: Secondary | ICD-10-CM | POA: Diagnosis not present

## 2021-10-05 DIAGNOSIS — M6281 Muscle weakness (generalized): Secondary | ICD-10-CM | POA: Diagnosis not present

## 2021-10-05 DIAGNOSIS — I5040 Unspecified combined systolic (congestive) and diastolic (congestive) heart failure: Secondary | ICD-10-CM | POA: Diagnosis not present

## 2021-10-05 DIAGNOSIS — M47816 Spondylosis without myelopathy or radiculopathy, lumbar region: Secondary | ICD-10-CM | POA: Diagnosis not present

## 2021-10-06 ENCOUNTER — Ambulatory Visit (HOSPITAL_COMMUNITY): Payer: Medicare Other

## 2021-10-06 ENCOUNTER — Encounter (HOSPITAL_COMMUNITY): Payer: Self-pay

## 2021-10-07 DIAGNOSIS — M6281 Muscle weakness (generalized): Secondary | ICD-10-CM | POA: Diagnosis not present

## 2021-10-07 DIAGNOSIS — M47816 Spondylosis without myelopathy or radiculopathy, lumbar region: Secondary | ICD-10-CM | POA: Diagnosis not present

## 2021-10-07 DIAGNOSIS — R2689 Other abnormalities of gait and mobility: Secondary | ICD-10-CM | POA: Diagnosis not present

## 2021-10-07 DIAGNOSIS — S46812A Strain of other muscles, fascia and tendons at shoulder and upper arm level, left arm, initial encounter: Secondary | ICD-10-CM | POA: Diagnosis not present

## 2021-10-07 DIAGNOSIS — I5040 Unspecified combined systolic (congestive) and diastolic (congestive) heart failure: Secondary | ICD-10-CM | POA: Diagnosis not present

## 2021-10-08 DIAGNOSIS — I5040 Unspecified combined systolic (congestive) and diastolic (congestive) heart failure: Secondary | ICD-10-CM | POA: Diagnosis not present

## 2021-10-08 DIAGNOSIS — M6281 Muscle weakness (generalized): Secondary | ICD-10-CM | POA: Diagnosis not present

## 2021-10-08 DIAGNOSIS — R2689 Other abnormalities of gait and mobility: Secondary | ICD-10-CM | POA: Diagnosis not present

## 2021-10-08 DIAGNOSIS — S46812A Strain of other muscles, fascia and tendons at shoulder and upper arm level, left arm, initial encounter: Secondary | ICD-10-CM | POA: Diagnosis not present

## 2021-10-08 DIAGNOSIS — M47816 Spondylosis without myelopathy or radiculopathy, lumbar region: Secondary | ICD-10-CM | POA: Diagnosis not present

## 2021-10-09 DIAGNOSIS — M47816 Spondylosis without myelopathy or radiculopathy, lumbar region: Secondary | ICD-10-CM | POA: Diagnosis not present

## 2021-10-09 DIAGNOSIS — R2689 Other abnormalities of gait and mobility: Secondary | ICD-10-CM | POA: Diagnosis not present

## 2021-10-09 DIAGNOSIS — S46812A Strain of other muscles, fascia and tendons at shoulder and upper arm level, left arm, initial encounter: Secondary | ICD-10-CM | POA: Diagnosis not present

## 2021-10-09 DIAGNOSIS — M6281 Muscle weakness (generalized): Secondary | ICD-10-CM | POA: Diagnosis not present

## 2021-10-09 DIAGNOSIS — I5040 Unspecified combined systolic (congestive) and diastolic (congestive) heart failure: Secondary | ICD-10-CM | POA: Diagnosis not present

## 2021-10-10 DIAGNOSIS — R2689 Other abnormalities of gait and mobility: Secondary | ICD-10-CM | POA: Diagnosis not present

## 2021-10-10 DIAGNOSIS — M47816 Spondylosis without myelopathy or radiculopathy, lumbar region: Secondary | ICD-10-CM | POA: Diagnosis not present

## 2021-10-10 DIAGNOSIS — I5040 Unspecified combined systolic (congestive) and diastolic (congestive) heart failure: Secondary | ICD-10-CM | POA: Diagnosis not present

## 2021-10-10 DIAGNOSIS — M6281 Muscle weakness (generalized): Secondary | ICD-10-CM | POA: Diagnosis not present

## 2021-10-10 DIAGNOSIS — S46812A Strain of other muscles, fascia and tendons at shoulder and upper arm level, left arm, initial encounter: Secondary | ICD-10-CM | POA: Diagnosis not present

## 2021-10-13 DIAGNOSIS — F3162 Bipolar disorder, current episode mixed, moderate: Secondary | ICD-10-CM | POA: Diagnosis not present

## 2021-10-13 DIAGNOSIS — G4701 Insomnia due to medical condition: Secondary | ICD-10-CM | POA: Diagnosis not present

## 2021-10-13 DIAGNOSIS — I5040 Unspecified combined systolic (congestive) and diastolic (congestive) heart failure: Secondary | ICD-10-CM | POA: Diagnosis not present

## 2021-10-13 DIAGNOSIS — R2689 Other abnormalities of gait and mobility: Secondary | ICD-10-CM | POA: Diagnosis not present

## 2021-10-13 DIAGNOSIS — M6281 Muscle weakness (generalized): Secondary | ICD-10-CM | POA: Diagnosis not present

## 2021-10-13 DIAGNOSIS — M47816 Spondylosis without myelopathy or radiculopathy, lumbar region: Secondary | ICD-10-CM | POA: Diagnosis not present

## 2021-10-13 DIAGNOSIS — S46812A Strain of other muscles, fascia and tendons at shoulder and upper arm level, left arm, initial encounter: Secondary | ICD-10-CM | POA: Diagnosis not present

## 2021-10-14 DIAGNOSIS — R2689 Other abnormalities of gait and mobility: Secondary | ICD-10-CM | POA: Diagnosis not present

## 2021-10-14 DIAGNOSIS — I5040 Unspecified combined systolic (congestive) and diastolic (congestive) heart failure: Secondary | ICD-10-CM | POA: Diagnosis not present

## 2021-10-14 DIAGNOSIS — M6281 Muscle weakness (generalized): Secondary | ICD-10-CM | POA: Diagnosis not present

## 2021-10-14 DIAGNOSIS — S46812A Strain of other muscles, fascia and tendons at shoulder and upper arm level, left arm, initial encounter: Secondary | ICD-10-CM | POA: Diagnosis not present

## 2021-10-14 DIAGNOSIS — M47816 Spondylosis without myelopathy or radiculopathy, lumbar region: Secondary | ICD-10-CM | POA: Diagnosis not present

## 2021-10-15 DIAGNOSIS — M47816 Spondylosis without myelopathy or radiculopathy, lumbar region: Secondary | ICD-10-CM | POA: Diagnosis not present

## 2021-10-15 DIAGNOSIS — R2689 Other abnormalities of gait and mobility: Secondary | ICD-10-CM | POA: Diagnosis not present

## 2021-10-15 DIAGNOSIS — I5040 Unspecified combined systolic (congestive) and diastolic (congestive) heart failure: Secondary | ICD-10-CM | POA: Diagnosis not present

## 2021-10-15 DIAGNOSIS — M6281 Muscle weakness (generalized): Secondary | ICD-10-CM | POA: Diagnosis not present

## 2021-10-15 DIAGNOSIS — S46812A Strain of other muscles, fascia and tendons at shoulder and upper arm level, left arm, initial encounter: Secondary | ICD-10-CM | POA: Diagnosis not present

## 2021-10-16 DIAGNOSIS — R2689 Other abnormalities of gait and mobility: Secondary | ICD-10-CM | POA: Diagnosis not present

## 2021-10-16 DIAGNOSIS — M6281 Muscle weakness (generalized): Secondary | ICD-10-CM | POA: Diagnosis not present

## 2021-10-16 DIAGNOSIS — M47816 Spondylosis without myelopathy or radiculopathy, lumbar region: Secondary | ICD-10-CM | POA: Diagnosis not present

## 2021-10-16 DIAGNOSIS — S46812A Strain of other muscles, fascia and tendons at shoulder and upper arm level, left arm, initial encounter: Secondary | ICD-10-CM | POA: Diagnosis not present

## 2021-10-16 DIAGNOSIS — I5040 Unspecified combined systolic (congestive) and diastolic (congestive) heart failure: Secondary | ICD-10-CM | POA: Diagnosis not present

## 2021-10-20 DIAGNOSIS — M47816 Spondylosis without myelopathy or radiculopathy, lumbar region: Secondary | ICD-10-CM | POA: Diagnosis not present

## 2021-10-20 DIAGNOSIS — M6281 Muscle weakness (generalized): Secondary | ICD-10-CM | POA: Diagnosis not present

## 2021-10-20 DIAGNOSIS — I5040 Unspecified combined systolic (congestive) and diastolic (congestive) heart failure: Secondary | ICD-10-CM | POA: Diagnosis not present

## 2021-10-20 DIAGNOSIS — S46812A Strain of other muscles, fascia and tendons at shoulder and upper arm level, left arm, initial encounter: Secondary | ICD-10-CM | POA: Diagnosis not present

## 2021-10-20 DIAGNOSIS — R2689 Other abnormalities of gait and mobility: Secondary | ICD-10-CM | POA: Diagnosis not present

## 2021-10-21 DIAGNOSIS — M47816 Spondylosis without myelopathy or radiculopathy, lumbar region: Secondary | ICD-10-CM | POA: Diagnosis not present

## 2021-10-21 DIAGNOSIS — I5040 Unspecified combined systolic (congestive) and diastolic (congestive) heart failure: Secondary | ICD-10-CM | POA: Diagnosis not present

## 2021-10-21 DIAGNOSIS — M6281 Muscle weakness (generalized): Secondary | ICD-10-CM | POA: Diagnosis not present

## 2021-10-21 DIAGNOSIS — S46812A Strain of other muscles, fascia and tendons at shoulder and upper arm level, left arm, initial encounter: Secondary | ICD-10-CM | POA: Diagnosis not present

## 2021-10-21 DIAGNOSIS — R2689 Other abnormalities of gait and mobility: Secondary | ICD-10-CM | POA: Diagnosis not present

## 2021-10-22 DIAGNOSIS — R2689 Other abnormalities of gait and mobility: Secondary | ICD-10-CM | POA: Diagnosis not present

## 2021-10-22 DIAGNOSIS — I5040 Unspecified combined systolic (congestive) and diastolic (congestive) heart failure: Secondary | ICD-10-CM | POA: Diagnosis not present

## 2021-10-22 DIAGNOSIS — M6281 Muscle weakness (generalized): Secondary | ICD-10-CM | POA: Diagnosis not present

## 2021-10-22 DIAGNOSIS — M47816 Spondylosis without myelopathy or radiculopathy, lumbar region: Secondary | ICD-10-CM | POA: Diagnosis not present

## 2021-10-22 DIAGNOSIS — S46812A Strain of other muscles, fascia and tendons at shoulder and upper arm level, left arm, initial encounter: Secondary | ICD-10-CM | POA: Diagnosis not present

## 2021-10-23 DIAGNOSIS — I5040 Unspecified combined systolic (congestive) and diastolic (congestive) heart failure: Secondary | ICD-10-CM | POA: Diagnosis not present

## 2021-10-23 DIAGNOSIS — R2689 Other abnormalities of gait and mobility: Secondary | ICD-10-CM | POA: Diagnosis not present

## 2021-10-23 DIAGNOSIS — M6281 Muscle weakness (generalized): Secondary | ICD-10-CM | POA: Diagnosis not present

## 2021-10-23 DIAGNOSIS — S46812A Strain of other muscles, fascia and tendons at shoulder and upper arm level, left arm, initial encounter: Secondary | ICD-10-CM | POA: Diagnosis not present

## 2021-10-23 DIAGNOSIS — M47816 Spondylosis without myelopathy or radiculopathy, lumbar region: Secondary | ICD-10-CM | POA: Diagnosis not present

## 2021-10-24 DIAGNOSIS — J449 Chronic obstructive pulmonary disease, unspecified: Secondary | ICD-10-CM | POA: Diagnosis not present

## 2021-10-24 DIAGNOSIS — M47816 Spondylosis without myelopathy or radiculopathy, lumbar region: Secondary | ICD-10-CM | POA: Diagnosis not present

## 2021-10-24 DIAGNOSIS — I5040 Unspecified combined systolic (congestive) and diastolic (congestive) heart failure: Secondary | ICD-10-CM | POA: Diagnosis not present

## 2021-10-24 DIAGNOSIS — F3163 Bipolar disorder, current episode mixed, severe, without psychotic features: Secondary | ICD-10-CM | POA: Diagnosis not present

## 2021-10-24 DIAGNOSIS — M6281 Muscle weakness (generalized): Secondary | ICD-10-CM | POA: Diagnosis not present

## 2021-10-24 DIAGNOSIS — S46812A Strain of other muscles, fascia and tendons at shoulder and upper arm level, left arm, initial encounter: Secondary | ICD-10-CM | POA: Diagnosis not present

## 2021-10-24 DIAGNOSIS — R2689 Other abnormalities of gait and mobility: Secondary | ICD-10-CM | POA: Diagnosis not present

## 2021-10-27 DIAGNOSIS — F5101 Primary insomnia: Secondary | ICD-10-CM | POA: Diagnosis not present

## 2021-10-27 DIAGNOSIS — R2689 Other abnormalities of gait and mobility: Secondary | ICD-10-CM | POA: Diagnosis not present

## 2021-10-27 DIAGNOSIS — S46812A Strain of other muscles, fascia and tendons at shoulder and upper arm level, left arm, initial encounter: Secondary | ICD-10-CM | POA: Diagnosis not present

## 2021-10-27 DIAGNOSIS — I5040 Unspecified combined systolic (congestive) and diastolic (congestive) heart failure: Secondary | ICD-10-CM | POA: Diagnosis not present

## 2021-10-27 DIAGNOSIS — M47816 Spondylosis without myelopathy or radiculopathy, lumbar region: Secondary | ICD-10-CM | POA: Diagnosis not present

## 2021-10-27 DIAGNOSIS — M6281 Muscle weakness (generalized): Secondary | ICD-10-CM | POA: Diagnosis not present

## 2021-10-28 DIAGNOSIS — M47816 Spondylosis without myelopathy or radiculopathy, lumbar region: Secondary | ICD-10-CM | POA: Diagnosis not present

## 2021-10-28 DIAGNOSIS — I5040 Unspecified combined systolic (congestive) and diastolic (congestive) heart failure: Secondary | ICD-10-CM | POA: Diagnosis not present

## 2021-10-28 DIAGNOSIS — Z23 Encounter for immunization: Secondary | ICD-10-CM | POA: Diagnosis not present

## 2021-10-28 DIAGNOSIS — S46812A Strain of other muscles, fascia and tendons at shoulder and upper arm level, left arm, initial encounter: Secondary | ICD-10-CM | POA: Diagnosis not present

## 2021-10-28 DIAGNOSIS — M6281 Muscle weakness (generalized): Secondary | ICD-10-CM | POA: Diagnosis not present

## 2021-10-28 DIAGNOSIS — R2689 Other abnormalities of gait and mobility: Secondary | ICD-10-CM | POA: Diagnosis not present

## 2021-10-30 DIAGNOSIS — I480 Paroxysmal atrial fibrillation: Secondary | ICD-10-CM | POA: Diagnosis not present

## 2021-10-30 DIAGNOSIS — I10 Essential (primary) hypertension: Secondary | ICD-10-CM | POA: Diagnosis not present

## 2021-10-30 DIAGNOSIS — N319 Neuromuscular dysfunction of bladder, unspecified: Secondary | ICD-10-CM | POA: Diagnosis not present

## 2021-10-30 DIAGNOSIS — E119 Type 2 diabetes mellitus without complications: Secondary | ICD-10-CM | POA: Diagnosis not present

## 2021-11-10 DIAGNOSIS — F5101 Primary insomnia: Secondary | ICD-10-CM | POA: Diagnosis not present

## 2021-11-10 DIAGNOSIS — I480 Paroxysmal atrial fibrillation: Secondary | ICD-10-CM | POA: Diagnosis not present

## 2021-11-10 DIAGNOSIS — F3162 Bipolar disorder, current episode mixed, moderate: Secondary | ICD-10-CM | POA: Diagnosis not present

## 2021-11-10 DIAGNOSIS — E119 Type 2 diabetes mellitus without complications: Secondary | ICD-10-CM | POA: Diagnosis not present

## 2021-11-10 DIAGNOSIS — E785 Hyperlipidemia, unspecified: Secondary | ICD-10-CM | POA: Diagnosis not present

## 2021-11-10 DIAGNOSIS — N319 Neuromuscular dysfunction of bladder, unspecified: Secondary | ICD-10-CM | POA: Diagnosis not present

## 2021-11-24 DIAGNOSIS — F5101 Primary insomnia: Secondary | ICD-10-CM | POA: Diagnosis not present

## 2021-11-25 DIAGNOSIS — R293 Abnormal posture: Secondary | ICD-10-CM | POA: Diagnosis not present

## 2021-11-25 DIAGNOSIS — Z741 Need for assistance with personal care: Secondary | ICD-10-CM | POA: Diagnosis not present

## 2021-11-25 DIAGNOSIS — J449 Chronic obstructive pulmonary disease, unspecified: Secondary | ICD-10-CM | POA: Diagnosis not present

## 2021-11-25 DIAGNOSIS — I5040 Unspecified combined systolic (congestive) and diastolic (congestive) heart failure: Secondary | ICD-10-CM | POA: Diagnosis not present

## 2021-11-27 DIAGNOSIS — Z741 Need for assistance with personal care: Secondary | ICD-10-CM | POA: Diagnosis not present

## 2021-11-27 DIAGNOSIS — I5040 Unspecified combined systolic (congestive) and diastolic (congestive) heart failure: Secondary | ICD-10-CM | POA: Diagnosis not present

## 2021-11-27 DIAGNOSIS — R293 Abnormal posture: Secondary | ICD-10-CM | POA: Diagnosis not present

## 2021-11-27 DIAGNOSIS — J449 Chronic obstructive pulmonary disease, unspecified: Secondary | ICD-10-CM | POA: Diagnosis not present

## 2021-11-28 DIAGNOSIS — Z741 Need for assistance with personal care: Secondary | ICD-10-CM | POA: Diagnosis not present

## 2021-11-28 DIAGNOSIS — R293 Abnormal posture: Secondary | ICD-10-CM | POA: Diagnosis not present

## 2021-11-28 DIAGNOSIS — J449 Chronic obstructive pulmonary disease, unspecified: Secondary | ICD-10-CM | POA: Diagnosis not present

## 2021-11-28 DIAGNOSIS — I5040 Unspecified combined systolic (congestive) and diastolic (congestive) heart failure: Secondary | ICD-10-CM | POA: Diagnosis not present

## 2021-12-01 DIAGNOSIS — Z741 Need for assistance with personal care: Secondary | ICD-10-CM | POA: Diagnosis not present

## 2021-12-01 DIAGNOSIS — J449 Chronic obstructive pulmonary disease, unspecified: Secondary | ICD-10-CM | POA: Diagnosis not present

## 2021-12-01 DIAGNOSIS — I5040 Unspecified combined systolic (congestive) and diastolic (congestive) heart failure: Secondary | ICD-10-CM | POA: Diagnosis not present

## 2021-12-01 DIAGNOSIS — R293 Abnormal posture: Secondary | ICD-10-CM | POA: Diagnosis not present

## 2021-12-02 DIAGNOSIS — I5040 Unspecified combined systolic (congestive) and diastolic (congestive) heart failure: Secondary | ICD-10-CM | POA: Diagnosis not present

## 2021-12-02 DIAGNOSIS — I517 Cardiomegaly: Secondary | ICD-10-CM | POA: Diagnosis not present

## 2021-12-02 DIAGNOSIS — R293 Abnormal posture: Secondary | ICD-10-CM | POA: Diagnosis not present

## 2021-12-02 DIAGNOSIS — J449 Chronic obstructive pulmonary disease, unspecified: Secondary | ICD-10-CM | POA: Diagnosis not present

## 2021-12-02 DIAGNOSIS — R059 Cough, unspecified: Secondary | ICD-10-CM | POA: Diagnosis not present

## 2021-12-02 DIAGNOSIS — Z741 Need for assistance with personal care: Secondary | ICD-10-CM | POA: Diagnosis not present

## 2021-12-03 DIAGNOSIS — R293 Abnormal posture: Secondary | ICD-10-CM | POA: Diagnosis not present

## 2021-12-03 DIAGNOSIS — Z741 Need for assistance with personal care: Secondary | ICD-10-CM | POA: Diagnosis not present

## 2021-12-03 DIAGNOSIS — J449 Chronic obstructive pulmonary disease, unspecified: Secondary | ICD-10-CM | POA: Diagnosis not present

## 2021-12-03 DIAGNOSIS — I5040 Unspecified combined systolic (congestive) and diastolic (congestive) heart failure: Secondary | ICD-10-CM | POA: Diagnosis not present

## 2021-12-04 DIAGNOSIS — I10 Essential (primary) hypertension: Secondary | ICD-10-CM | POA: Diagnosis not present

## 2021-12-04 DIAGNOSIS — E119 Type 2 diabetes mellitus without complications: Secondary | ICD-10-CM | POA: Diagnosis not present

## 2021-12-04 DIAGNOSIS — E785 Hyperlipidemia, unspecified: Secondary | ICD-10-CM | POA: Diagnosis not present

## 2021-12-04 DIAGNOSIS — I480 Paroxysmal atrial fibrillation: Secondary | ICD-10-CM | POA: Diagnosis not present

## 2021-12-04 DIAGNOSIS — F3163 Bipolar disorder, current episode mixed, severe, without psychotic features: Secondary | ICD-10-CM | POA: Diagnosis not present

## 2021-12-04 DIAGNOSIS — J449 Chronic obstructive pulmonary disease, unspecified: Secondary | ICD-10-CM | POA: Diagnosis not present

## 2021-12-05 DIAGNOSIS — J449 Chronic obstructive pulmonary disease, unspecified: Secondary | ICD-10-CM | POA: Diagnosis not present

## 2021-12-05 DIAGNOSIS — Z741 Need for assistance with personal care: Secondary | ICD-10-CM | POA: Diagnosis not present

## 2021-12-05 DIAGNOSIS — I10 Essential (primary) hypertension: Secondary | ICD-10-CM | POA: Diagnosis not present

## 2021-12-05 DIAGNOSIS — E785 Hyperlipidemia, unspecified: Secondary | ICD-10-CM | POA: Diagnosis not present

## 2021-12-05 DIAGNOSIS — I480 Paroxysmal atrial fibrillation: Secondary | ICD-10-CM | POA: Diagnosis not present

## 2021-12-05 DIAGNOSIS — E119 Type 2 diabetes mellitus without complications: Secondary | ICD-10-CM | POA: Diagnosis not present

## 2021-12-05 DIAGNOSIS — I5040 Unspecified combined systolic (congestive) and diastolic (congestive) heart failure: Secondary | ICD-10-CM | POA: Diagnosis not present

## 2021-12-05 DIAGNOSIS — R293 Abnormal posture: Secondary | ICD-10-CM | POA: Diagnosis not present

## 2021-12-08 DIAGNOSIS — F3162 Bipolar disorder, current episode mixed, moderate: Secondary | ICD-10-CM | POA: Diagnosis not present

## 2021-12-08 DIAGNOSIS — F5101 Primary insomnia: Secondary | ICD-10-CM | POA: Diagnosis not present

## 2021-12-09 DIAGNOSIS — R338 Other retention of urine: Secondary | ICD-10-CM | POA: Diagnosis not present

## 2021-12-09 DIAGNOSIS — I5040 Unspecified combined systolic (congestive) and diastolic (congestive) heart failure: Secondary | ICD-10-CM | POA: Diagnosis not present

## 2021-12-09 DIAGNOSIS — J449 Chronic obstructive pulmonary disease, unspecified: Secondary | ICD-10-CM | POA: Diagnosis not present

## 2021-12-09 DIAGNOSIS — Z741 Need for assistance with personal care: Secondary | ICD-10-CM | POA: Diagnosis not present

## 2021-12-09 DIAGNOSIS — R293 Abnormal posture: Secondary | ICD-10-CM | POA: Diagnosis not present

## 2021-12-10 DIAGNOSIS — J449 Chronic obstructive pulmonary disease, unspecified: Secondary | ICD-10-CM | POA: Diagnosis not present

## 2021-12-10 DIAGNOSIS — R293 Abnormal posture: Secondary | ICD-10-CM | POA: Diagnosis not present

## 2021-12-10 DIAGNOSIS — I5040 Unspecified combined systolic (congestive) and diastolic (congestive) heart failure: Secondary | ICD-10-CM | POA: Diagnosis not present

## 2021-12-10 DIAGNOSIS — Z741 Need for assistance with personal care: Secondary | ICD-10-CM | POA: Diagnosis not present

## 2021-12-11 DIAGNOSIS — J449 Chronic obstructive pulmonary disease, unspecified: Secondary | ICD-10-CM | POA: Diagnosis not present

## 2021-12-11 DIAGNOSIS — Z741 Need for assistance with personal care: Secondary | ICD-10-CM | POA: Diagnosis not present

## 2021-12-11 DIAGNOSIS — I5040 Unspecified combined systolic (congestive) and diastolic (congestive) heart failure: Secondary | ICD-10-CM | POA: Diagnosis not present

## 2021-12-11 DIAGNOSIS — R293 Abnormal posture: Secondary | ICD-10-CM | POA: Diagnosis not present

## 2021-12-12 DIAGNOSIS — Z741 Need for assistance with personal care: Secondary | ICD-10-CM | POA: Diagnosis not present

## 2021-12-12 DIAGNOSIS — J449 Chronic obstructive pulmonary disease, unspecified: Secondary | ICD-10-CM | POA: Diagnosis not present

## 2021-12-12 DIAGNOSIS — I5040 Unspecified combined systolic (congestive) and diastolic (congestive) heart failure: Secondary | ICD-10-CM | POA: Diagnosis not present

## 2021-12-12 DIAGNOSIS — R293 Abnormal posture: Secondary | ICD-10-CM | POA: Diagnosis not present

## 2021-12-15 DIAGNOSIS — I5040 Unspecified combined systolic (congestive) and diastolic (congestive) heart failure: Secondary | ICD-10-CM | POA: Diagnosis not present

## 2021-12-15 DIAGNOSIS — R293 Abnormal posture: Secondary | ICD-10-CM | POA: Diagnosis not present

## 2021-12-15 DIAGNOSIS — Z741 Need for assistance with personal care: Secondary | ICD-10-CM | POA: Diagnosis not present

## 2021-12-15 DIAGNOSIS — J449 Chronic obstructive pulmonary disease, unspecified: Secondary | ICD-10-CM | POA: Diagnosis not present

## 2021-12-16 DIAGNOSIS — R293 Abnormal posture: Secondary | ICD-10-CM | POA: Diagnosis not present

## 2021-12-16 DIAGNOSIS — J449 Chronic obstructive pulmonary disease, unspecified: Secondary | ICD-10-CM | POA: Diagnosis not present

## 2021-12-16 DIAGNOSIS — I5040 Unspecified combined systolic (congestive) and diastolic (congestive) heart failure: Secondary | ICD-10-CM | POA: Diagnosis not present

## 2021-12-16 DIAGNOSIS — Z741 Need for assistance with personal care: Secondary | ICD-10-CM | POA: Diagnosis not present

## 2021-12-17 DIAGNOSIS — Z741 Need for assistance with personal care: Secondary | ICD-10-CM | POA: Diagnosis not present

## 2021-12-17 DIAGNOSIS — I5040 Unspecified combined systolic (congestive) and diastolic (congestive) heart failure: Secondary | ICD-10-CM | POA: Diagnosis not present

## 2021-12-17 DIAGNOSIS — J449 Chronic obstructive pulmonary disease, unspecified: Secondary | ICD-10-CM | POA: Diagnosis not present

## 2021-12-17 DIAGNOSIS — R293 Abnormal posture: Secondary | ICD-10-CM | POA: Diagnosis not present

## 2021-12-18 DIAGNOSIS — R293 Abnormal posture: Secondary | ICD-10-CM | POA: Diagnosis not present

## 2021-12-18 DIAGNOSIS — J449 Chronic obstructive pulmonary disease, unspecified: Secondary | ICD-10-CM | POA: Diagnosis not present

## 2021-12-18 DIAGNOSIS — I5040 Unspecified combined systolic (congestive) and diastolic (congestive) heart failure: Secondary | ICD-10-CM | POA: Diagnosis not present

## 2021-12-18 DIAGNOSIS — Z741 Need for assistance with personal care: Secondary | ICD-10-CM | POA: Diagnosis not present

## 2021-12-19 DIAGNOSIS — J449 Chronic obstructive pulmonary disease, unspecified: Secondary | ICD-10-CM | POA: Diagnosis not present

## 2021-12-19 DIAGNOSIS — I5040 Unspecified combined systolic (congestive) and diastolic (congestive) heart failure: Secondary | ICD-10-CM | POA: Diagnosis not present

## 2021-12-19 DIAGNOSIS — Z741 Need for assistance with personal care: Secondary | ICD-10-CM | POA: Diagnosis not present

## 2021-12-19 DIAGNOSIS — R293 Abnormal posture: Secondary | ICD-10-CM | POA: Diagnosis not present

## 2021-12-22 DIAGNOSIS — R293 Abnormal posture: Secondary | ICD-10-CM | POA: Diagnosis not present

## 2021-12-22 DIAGNOSIS — I5040 Unspecified combined systolic (congestive) and diastolic (congestive) heart failure: Secondary | ICD-10-CM | POA: Diagnosis not present

## 2021-12-22 DIAGNOSIS — J449 Chronic obstructive pulmonary disease, unspecified: Secondary | ICD-10-CM | POA: Diagnosis not present

## 2021-12-22 DIAGNOSIS — Z741 Need for assistance with personal care: Secondary | ICD-10-CM | POA: Diagnosis not present

## 2021-12-23 DIAGNOSIS — J449 Chronic obstructive pulmonary disease, unspecified: Secondary | ICD-10-CM | POA: Diagnosis not present

## 2021-12-23 DIAGNOSIS — Z741 Need for assistance with personal care: Secondary | ICD-10-CM | POA: Diagnosis not present

## 2021-12-23 DIAGNOSIS — R293 Abnormal posture: Secondary | ICD-10-CM | POA: Diagnosis not present

## 2021-12-23 DIAGNOSIS — I5040 Unspecified combined systolic (congestive) and diastolic (congestive) heart failure: Secondary | ICD-10-CM | POA: Diagnosis not present

## 2021-12-24 DIAGNOSIS — R293 Abnormal posture: Secondary | ICD-10-CM | POA: Diagnosis not present

## 2021-12-24 DIAGNOSIS — J449 Chronic obstructive pulmonary disease, unspecified: Secondary | ICD-10-CM | POA: Diagnosis not present

## 2021-12-24 DIAGNOSIS — Z741 Need for assistance with personal care: Secondary | ICD-10-CM | POA: Diagnosis not present

## 2021-12-24 DIAGNOSIS — I5040 Unspecified combined systolic (congestive) and diastolic (congestive) heart failure: Secondary | ICD-10-CM | POA: Diagnosis not present

## 2021-12-24 IMAGING — CT CT ABD-PELV W/ CM
2 of 4 series · 14 of 46 positions shown, 16 images · IV contrast (APPLIED)
Comparison: None.

CLINICAL DATA: Enlarging abdominal hernia, nausea, vomiting,
diffuse abdominal pain

EXAM:
CT ABDOMEN AND PELVIS WITH CONTRAST
TECHNIQUE: Multidetector CT imaging of the abdomen and pelvis was performed
using the standard protocol following bolus administration of
intravenous contrast.
CONTRAST:  75mL OMNIPAQUE IOHEXOL 300 MG/ML  SOLN

[Series 3: abd/ pelvis 5.0 efov · axial · 1.27mm/px · z∈[-414,-34]mm · 11 of 92 slices shown, 13 images]
[im 8/92  soft-tissue]
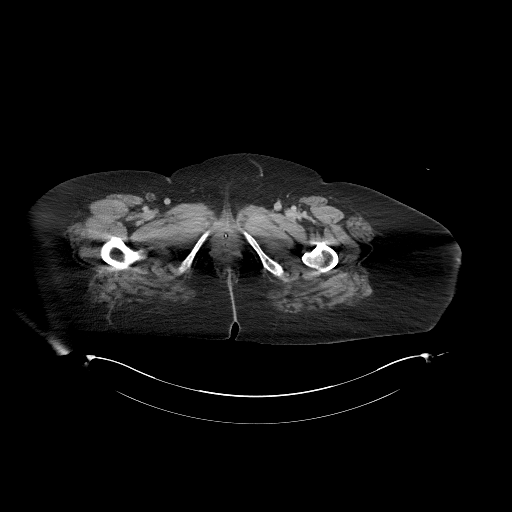
[im 8/92  bone]
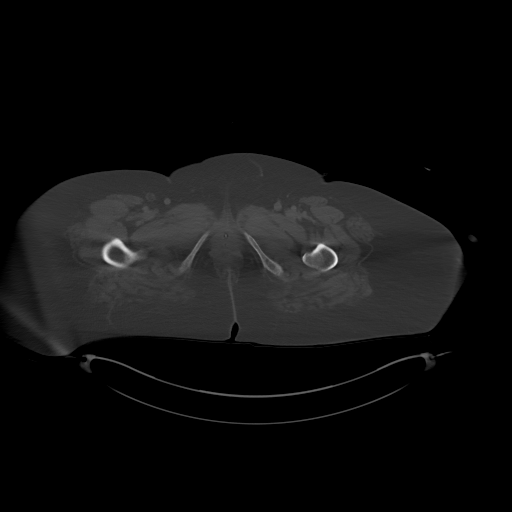
[im 15/92  soft-tissue]
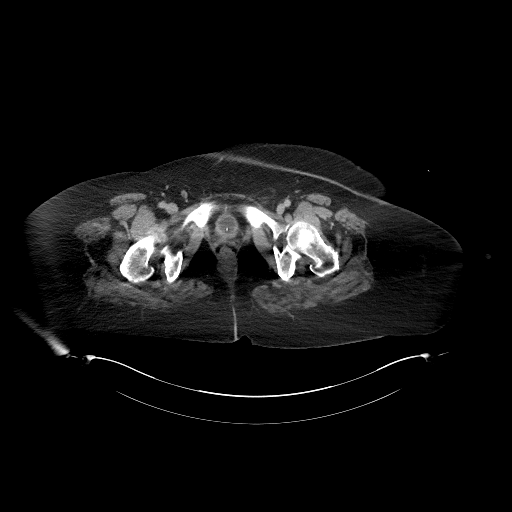
[im 22/92  soft-tissue]
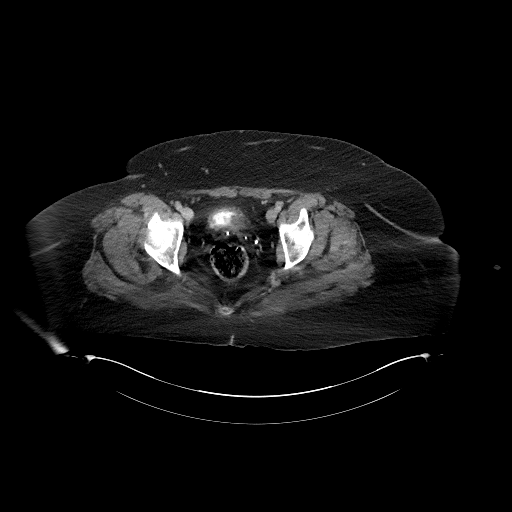
[im 30/92  soft-tissue]
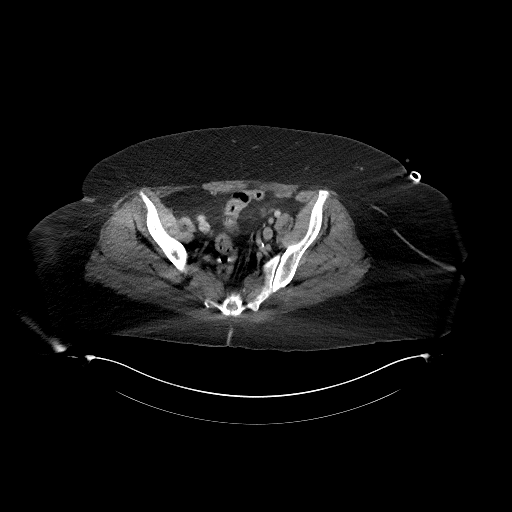
[im 37/92  soft-tissue]
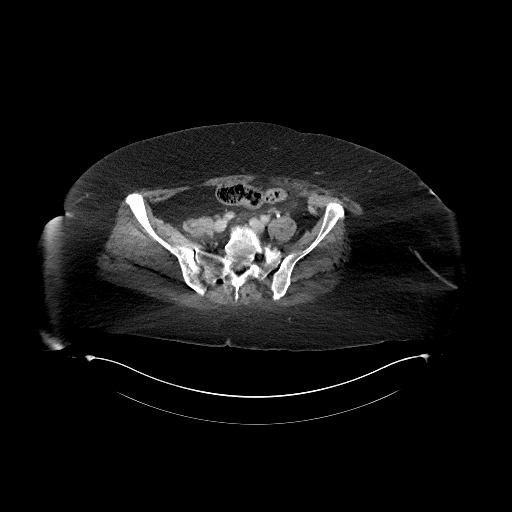
[im 48/92  soft-tissue]
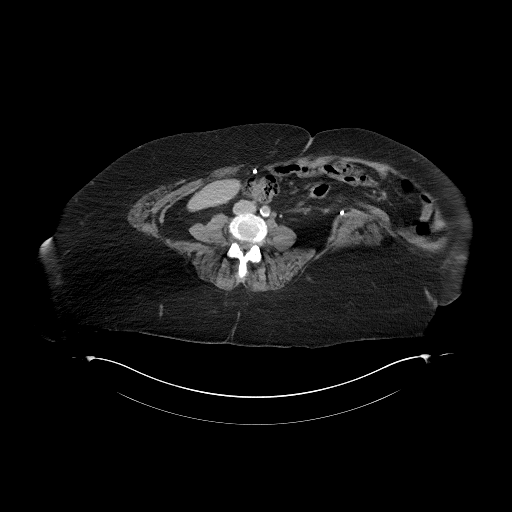
[im 55/92  soft-tissue]
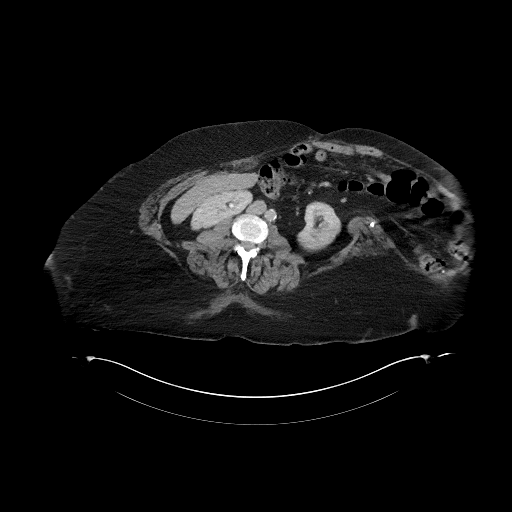
[im 62/92  soft-tissue]
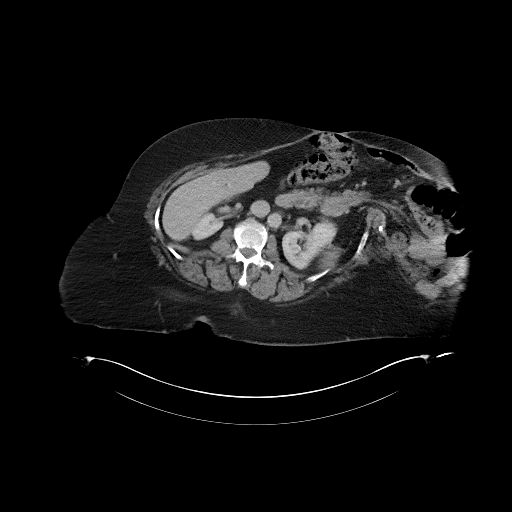
[im 70/92  soft-tissue]
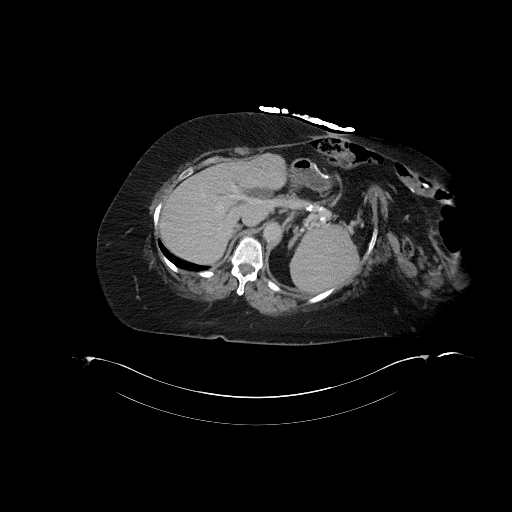
[im 70/92  bone]
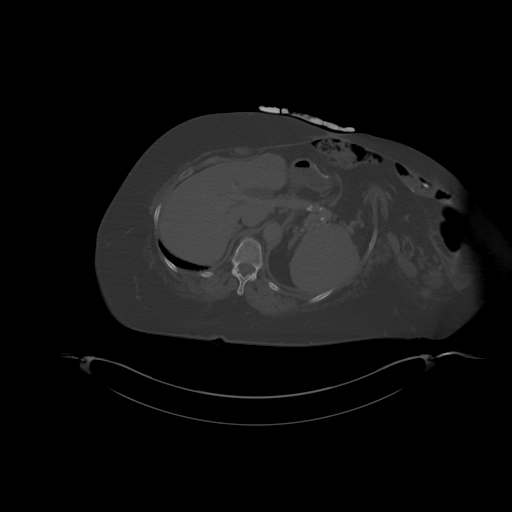
[im 77/92  soft-tissue]
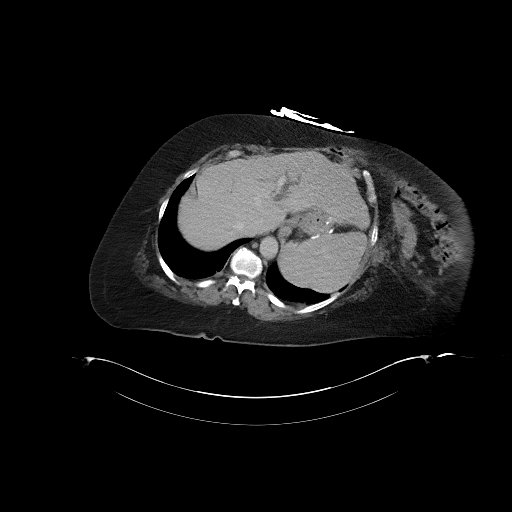
[im 84/92  soft-tissue]
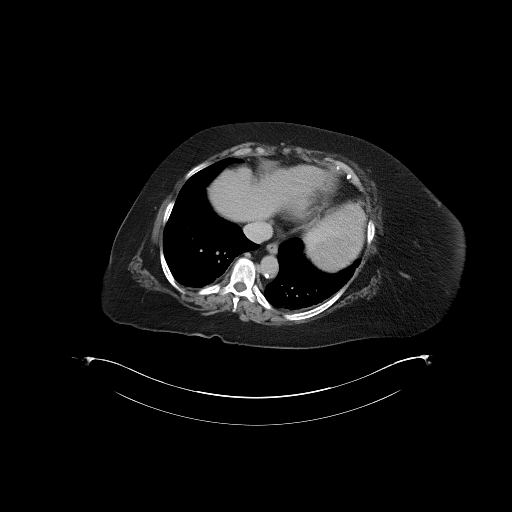

[Series 6: coronal soft tissue · coronal · 0.90mm/px · 3 of 116 slices shown]
[im 39/116  soft-tissue]
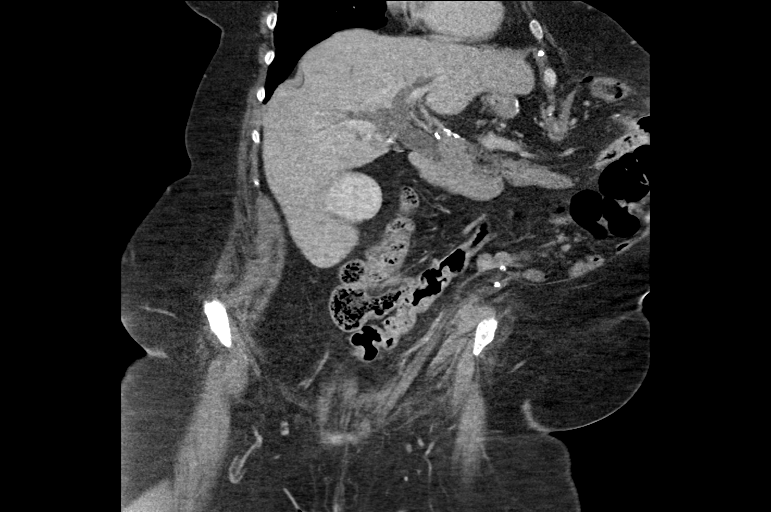
[im 52/116  soft-tissue]
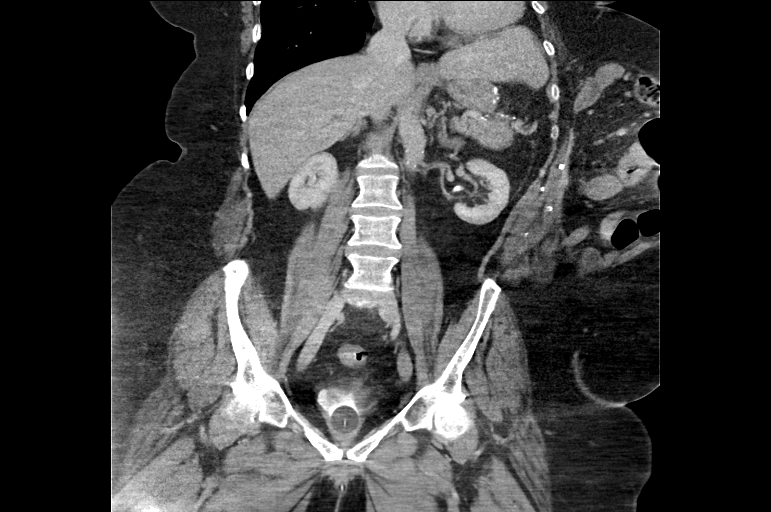
[im 64/116  soft-tissue]
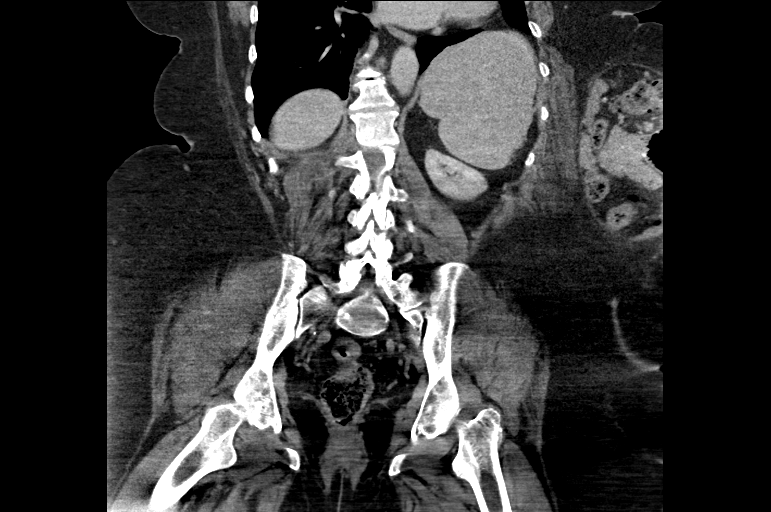

[14 of 46 positions shown; findings below may reference images not displayed]

FINDINGS: Lower chest: The visualized lung bases are clear. The visualized
heart and pericardium are unremarkable.

Hepatobiliary: Status post cholecystectomy. Mild intrahepatic and
marked extrahepatic biliary ductal dilation is present with the
extrahepatic bile duct measuring up to 18 mm but tapering toward the
pancreatic head. This may simply represent post cholecystectomy
change, but is is more prominent than is typically noted and distal
obstructing process, such as ampullary stenosis, is difficult to
exclude. The liver is otherwise unremarkable.

Pancreas: Unremarkable

Spleen: Unremarkable

Adrenals/Urinary Tract: The adrenal glands are unremarkable. The
kidneys are normal in size and position. No hydronephrosis. No
definite intrarenal or ureteral calculi are identified though
imaging is slightly limited by the presence of contrast within the
collecting system. No enhancing intrarenal masses. Foley catheter
balloon is seen within a decompressed bladder lumen.

Stomach/Bowel: Surgical changes of gastric sleeve resection and
partial small-bowel resection are identified. A large left abdominal
wall hernia is present with much of the a large and small bowel
within the wide-mouth hernia sac which is partially excluded from
view. There is marked attenuation of the rectus abdominus
musculature. The visualized large and small bowel are unremarkable
and there is no evidence of obstruction or focal inflammation. No
free intraperitoneal gas or fluid.

Vascular/Lymphatic: Moderate atherosclerotic calcification within
the abdominal aorta. No aortic aneurysm. No pathologic adenopathy
within the abdomen and pelvis.

Reproductive: Status post hysterectomy. No adnexal masses.

Other: As noted above, a large left lateral abdominal wall hernia is
present with a wide-mouth neck measuring at least 8.2 x 14.4 cm. The
hernia appears to arise immediately above a a.m. ventral hernia
repair utilizing mesh. As noted above, there is marked attenuation
of the abdominal wall musculature. Defect is noted within the left
lower quadrant likely reflecting a remote ostomy.

Musculoskeletal: No acute bone abnormality. Degenerative changes are
seen within the lumbar spine.
IMPRESSION: Large left abdominal wall hernia arising just above a ventral hernia
repair utilizing mesh containing the majority of large and small
bowel within the wide-mouth hernia sac. Marked attenuation of the
abdominal wall musculature is noted. The contained loops of large
and small bowel appear unremarkable though are incompletely
visualized on this examination.

Marked extrahepatic biliary ductal dilation. While this may simply
represent post cholecystectomy change, a distal obstructing process
is not excluded and correlation with liver enzymes may be helpful to
exclude this.

Aortic Atherosclerosis ([id number]).

## 2021-12-25 DIAGNOSIS — J449 Chronic obstructive pulmonary disease, unspecified: Secondary | ICD-10-CM | POA: Diagnosis not present

## 2021-12-25 DIAGNOSIS — R293 Abnormal posture: Secondary | ICD-10-CM | POA: Diagnosis not present

## 2021-12-25 DIAGNOSIS — I5040 Unspecified combined systolic (congestive) and diastolic (congestive) heart failure: Secondary | ICD-10-CM | POA: Diagnosis not present

## 2021-12-25 DIAGNOSIS — Z741 Need for assistance with personal care: Secondary | ICD-10-CM | POA: Diagnosis not present

## 2021-12-26 DIAGNOSIS — J449 Chronic obstructive pulmonary disease, unspecified: Secondary | ICD-10-CM | POA: Diagnosis not present

## 2021-12-26 DIAGNOSIS — R293 Abnormal posture: Secondary | ICD-10-CM | POA: Diagnosis not present

## 2021-12-26 DIAGNOSIS — I5040 Unspecified combined systolic (congestive) and diastolic (congestive) heart failure: Secondary | ICD-10-CM | POA: Diagnosis not present

## 2021-12-26 DIAGNOSIS — Z741 Need for assistance with personal care: Secondary | ICD-10-CM | POA: Diagnosis not present

## 2022-01-01 ENCOUNTER — Telehealth: Payer: Self-pay

## 2022-01-01 DIAGNOSIS — I480 Paroxysmal atrial fibrillation: Secondary | ICD-10-CM | POA: Diagnosis not present

## 2022-01-01 DIAGNOSIS — E119 Type 2 diabetes mellitus without complications: Secondary | ICD-10-CM | POA: Diagnosis not present

## 2022-01-01 DIAGNOSIS — I10 Essential (primary) hypertension: Secondary | ICD-10-CM | POA: Diagnosis not present

## 2022-01-01 DIAGNOSIS — E785 Hyperlipidemia, unspecified: Secondary | ICD-10-CM | POA: Diagnosis not present

## 2022-01-01 NOTE — Patient Outreach (Signed)
  Care Coordination   01/01/2022 Name: Faith Flores MRN: [id number] DOB: [birth year]   Care Coordination Outreach Attempts:  An unsuccessful telephone outreach was attempted today to offer the patient information about available care coordination services as a benefit of their health plan.   Follow Up Plan:  Additional outreach attempts will be made to offer the patient care coordination information and services.   Encounter Outcome:  No Answer   Care Coordination Interventions:  No, not indicated    Rowe Pavy, RN, BSN, [facility] [employer] [phone]

## 2022-01-08 ENCOUNTER — Telehealth: Payer: Self-pay

## 2022-01-08 NOTE — Patient Outreach (Signed)
  Care Coordination   01/08/2022 Name: Faith Flores MRN: [id number] DOB: 12-23-1955   Care Coordination Outreach Attempts:  A second unsuccessful outreach was attempted today to offer the patient with information about available care coordination services as a benefit of their health plan.     Follow Up Plan:  Additional outreach attempts will be made to offer the patient care coordination information and services.   Encounter Outcome:  No Answer   Care Coordination Interventions:  No, not indicated    Tomasa Rand, RN, BSN, [facility] [employer] [phone]

## 2022-01-09 DIAGNOSIS — I5043 Acute on chronic combined systolic (congestive) and diastolic (congestive) heart failure: Secondary | ICD-10-CM | POA: Diagnosis not present

## 2022-01-09 DIAGNOSIS — E119 Type 2 diabetes mellitus without complications: Secondary | ICD-10-CM | POA: Diagnosis not present

## 2022-01-09 DIAGNOSIS — E785 Hyperlipidemia, unspecified: Secondary | ICD-10-CM | POA: Diagnosis not present

## 2022-01-09 DIAGNOSIS — I10 Essential (primary) hypertension: Secondary | ICD-10-CM | POA: Diagnosis not present

## 2022-01-12 ENCOUNTER — Telehealth: Payer: Self-pay

## 2022-01-12 DIAGNOSIS — F3162 Bipolar disorder, current episode mixed, moderate: Secondary | ICD-10-CM | POA: Diagnosis not present

## 2022-01-12 DIAGNOSIS — F5101 Primary insomnia: Secondary | ICD-10-CM | POA: Diagnosis not present

## 2022-01-12 NOTE — Patient Outreach (Signed)
  Care Coordination   01/12/2022 Name: ZYLAH ELSBERND MRN: [id number] DOB: 12/19/1955   Care Coordination Outreach Attempts:  A third unsuccessful outreach was attempted today to offer the patient with information about available care coordination services as a benefit of their health plan.   Follow Up Plan:  No further outreach attempts will be made at this time. We have been unable to contact the patient to offer or enroll patient in care coordination services  Encounter Outcome:  No Answer   Care Coordination Interventions:  No, not indicated    Tomasa Rand, RN, BSN, [facility] [place] Coordinator [phone]

## 2022-01-15 DIAGNOSIS — F250 Schizoaffective disorder, bipolar type: Secondary | ICD-10-CM | POA: Diagnosis not present

## 2022-01-15 DIAGNOSIS — F329 Major depressive disorder, single episode, unspecified: Secondary | ICD-10-CM | POA: Diagnosis not present

## 2022-01-15 DIAGNOSIS — F419 Anxiety disorder, unspecified: Secondary | ICD-10-CM | POA: Diagnosis not present

## 2022-01-15 DIAGNOSIS — F319 Bipolar disorder, unspecified: Secondary | ICD-10-CM | POA: Diagnosis not present

## 2022-01-21 DIAGNOSIS — F3162 Bipolar disorder, current episode mixed, moderate: Secondary | ICD-10-CM | POA: Diagnosis not present

## 2022-01-23 DIAGNOSIS — J449 Chronic obstructive pulmonary disease, unspecified: Secondary | ICD-10-CM | POA: Diagnosis not present

## 2022-01-23 DIAGNOSIS — E118 Type 2 diabetes mellitus with unspecified complications: Secondary | ICD-10-CM | POA: Diagnosis not present

## 2022-02-05 DIAGNOSIS — I10 Essential (primary) hypertension: Secondary | ICD-10-CM | POA: Diagnosis not present

## 2022-02-05 DIAGNOSIS — E119 Type 2 diabetes mellitus without complications: Secondary | ICD-10-CM | POA: Diagnosis not present

## 2022-02-05 DIAGNOSIS — I5043 Acute on chronic combined systolic (congestive) and diastolic (congestive) heart failure: Secondary | ICD-10-CM | POA: Diagnosis not present

## 2022-02-05 DIAGNOSIS — E785 Hyperlipidemia, unspecified: Secondary | ICD-10-CM | POA: Diagnosis not present

## 2022-02-06 DIAGNOSIS — E785 Hyperlipidemia, unspecified: Secondary | ICD-10-CM | POA: Diagnosis not present

## 2022-02-06 DIAGNOSIS — E119 Type 2 diabetes mellitus without complications: Secondary | ICD-10-CM | POA: Diagnosis not present

## 2022-02-06 DIAGNOSIS — I10 Essential (primary) hypertension: Secondary | ICD-10-CM | POA: Diagnosis not present

## 2022-02-06 DIAGNOSIS — J449 Chronic obstructive pulmonary disease, unspecified: Secondary | ICD-10-CM | POA: Diagnosis not present

## 2022-02-08 DIAGNOSIS — M1712 Unilateral primary osteoarthritis, left knee: Secondary | ICD-10-CM | POA: Diagnosis not present

## 2022-02-09 DIAGNOSIS — F3162 Bipolar disorder, current episode mixed, moderate: Secondary | ICD-10-CM | POA: Diagnosis not present

## 2022-02-09 DIAGNOSIS — F5101 Primary insomnia: Secondary | ICD-10-CM | POA: Diagnosis not present

## 2022-02-10 ENCOUNTER — Ambulatory Visit (INDEPENDENT_AMBULATORY_CARE_PROVIDER_SITE_OTHER): Payer: Medicare Other | Admitting: Physician Assistant

## 2022-02-10 ENCOUNTER — Ambulatory Visit (INDEPENDENT_AMBULATORY_CARE_PROVIDER_SITE_OTHER): Payer: Medicare Other

## 2022-02-10 DIAGNOSIS — M25562 Pain in left knee: Secondary | ICD-10-CM | POA: Diagnosis not present
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
## 2022-02-11 ENCOUNTER — Ambulatory Visit: Payer: Medicare Other | Admitting: Family

## 2022-02-11 DIAGNOSIS — F329 Major depressive disorder, single episode, unspecified: Secondary | ICD-10-CM | POA: Diagnosis not present

## 2022-02-16 DIAGNOSIS — M6281 Muscle weakness (generalized): Secondary | ICD-10-CM | POA: Diagnosis not present

## 2022-02-16 DIAGNOSIS — I5040 Unspecified combined systolic (congestive) and diastolic (congestive) heart failure: Secondary | ICD-10-CM | POA: Diagnosis not present

## 2022-02-16 DIAGNOSIS — E0840 Diabetes mellitus due to underlying condition with diabetic neuropathy, unspecified: Secondary | ICD-10-CM | POA: Diagnosis not present

## 2022-02-17 DIAGNOSIS — E0840 Diabetes mellitus due to underlying condition with diabetic neuropathy, unspecified: Secondary | ICD-10-CM | POA: Diagnosis not present

## 2022-02-17 DIAGNOSIS — M6281 Muscle weakness (generalized): Secondary | ICD-10-CM | POA: Diagnosis not present

## 2022-02-17 DIAGNOSIS — I5040 Unspecified combined systolic (congestive) and diastolic (congestive) heart failure: Secondary | ICD-10-CM | POA: Diagnosis not present

## 2022-02-17 DIAGNOSIS — E118 Type 2 diabetes mellitus with unspecified complications: Secondary | ICD-10-CM | POA: Diagnosis not present

## 2022-02-17 DIAGNOSIS — J449 Chronic obstructive pulmonary disease, unspecified: Secondary | ICD-10-CM | POA: Diagnosis not present

## 2022-02-18 DIAGNOSIS — E0840 Diabetes mellitus due to underlying condition with diabetic neuropathy, unspecified: Secondary | ICD-10-CM | POA: Diagnosis not present

## 2022-02-18 DIAGNOSIS — M6281 Muscle weakness (generalized): Secondary | ICD-10-CM | POA: Diagnosis not present

## 2022-02-18 DIAGNOSIS — I5040 Unspecified combined systolic (congestive) and diastolic (congestive) heart failure: Secondary | ICD-10-CM | POA: Diagnosis not present

## 2022-02-18 DIAGNOSIS — F3162 Bipolar disorder, current episode mixed, moderate: Secondary | ICD-10-CM | POA: Diagnosis not present

## 2022-02-19 DIAGNOSIS — E0840 Diabetes mellitus due to underlying condition with diabetic neuropathy, unspecified: Secondary | ICD-10-CM | POA: Diagnosis not present

## 2022-02-19 DIAGNOSIS — M6281 Muscle weakness (generalized): Secondary | ICD-10-CM | POA: Diagnosis not present

## 2022-02-19 DIAGNOSIS — I5040 Unspecified combined systolic (congestive) and diastolic (congestive) heart failure: Secondary | ICD-10-CM | POA: Diagnosis not present

## 2022-02-20 DIAGNOSIS — M6281 Muscle weakness (generalized): Secondary | ICD-10-CM | POA: Diagnosis not present

## 2022-02-20 DIAGNOSIS — I5040 Unspecified combined systolic (congestive) and diastolic (congestive) heart failure: Secondary | ICD-10-CM | POA: Diagnosis not present

## 2022-02-20 DIAGNOSIS — E0840 Diabetes mellitus due to underlying condition with diabetic neuropathy, unspecified: Secondary | ICD-10-CM | POA: Diagnosis not present

## 2022-02-23 DIAGNOSIS — E119 Type 2 diabetes mellitus without complications: Secondary | ICD-10-CM | POA: Diagnosis not present

## 2022-02-23 DIAGNOSIS — M6281 Muscle weakness (generalized): Secondary | ICD-10-CM | POA: Diagnosis not present

## 2022-02-23 DIAGNOSIS — E785 Hyperlipidemia, unspecified: Secondary | ICD-10-CM | POA: Diagnosis not present

## 2022-02-23 DIAGNOSIS — I5040 Unspecified combined systolic (congestive) and diastolic (congestive) heart failure: Secondary | ICD-10-CM | POA: Diagnosis not present

## 2022-02-23 DIAGNOSIS — I10 Essential (primary) hypertension: Secondary | ICD-10-CM | POA: Diagnosis not present

## 2022-02-23 DIAGNOSIS — E0840 Diabetes mellitus due to underlying condition with diabetic neuropathy, unspecified: Secondary | ICD-10-CM | POA: Diagnosis not present

## 2022-02-23 DIAGNOSIS — I480 Paroxysmal atrial fibrillation: Secondary | ICD-10-CM | POA: Diagnosis not present

## 2022-02-24 DIAGNOSIS — M6281 Muscle weakness (generalized): Secondary | ICD-10-CM | POA: Diagnosis not present

## 2022-02-24 DIAGNOSIS — I5040 Unspecified combined systolic (congestive) and diastolic (congestive) heart failure: Secondary | ICD-10-CM | POA: Diagnosis not present

## 2022-02-24 DIAGNOSIS — E0840 Diabetes mellitus due to underlying condition with diabetic neuropathy, unspecified: Secondary | ICD-10-CM | POA: Diagnosis not present

## 2022-02-25 DIAGNOSIS — I5040 Unspecified combined systolic (congestive) and diastolic (congestive) heart failure: Secondary | ICD-10-CM | POA: Diagnosis not present

## 2022-02-25 DIAGNOSIS — M6281 Muscle weakness (generalized): Secondary | ICD-10-CM | POA: Diagnosis not present

## 2022-02-25 DIAGNOSIS — E0840 Diabetes mellitus due to underlying condition with diabetic neuropathy, unspecified: Secondary | ICD-10-CM | POA: Diagnosis not present

## 2022-02-26 DIAGNOSIS — J449 Chronic obstructive pulmonary disease, unspecified: Secondary | ICD-10-CM | POA: Diagnosis not present

## 2022-02-26 DIAGNOSIS — M6281 Muscle weakness (generalized): Secondary | ICD-10-CM | POA: Diagnosis not present

## 2022-02-26 DIAGNOSIS — E0840 Diabetes mellitus due to underlying condition with diabetic neuropathy, unspecified: Secondary | ICD-10-CM | POA: Diagnosis not present

## 2022-02-26 DIAGNOSIS — F3163 Bipolar disorder, current episode mixed, severe, without psychotic features: Secondary | ICD-10-CM | POA: Diagnosis not present

## 2022-02-26 DIAGNOSIS — I5040 Unspecified combined systolic (congestive) and diastolic (congestive) heart failure: Secondary | ICD-10-CM | POA: Diagnosis not present

## 2022-02-27 DIAGNOSIS — E0840 Diabetes mellitus due to underlying condition with diabetic neuropathy, unspecified: Secondary | ICD-10-CM | POA: Diagnosis not present

## 2022-02-27 DIAGNOSIS — M6281 Muscle weakness (generalized): Secondary | ICD-10-CM | POA: Diagnosis not present

## 2022-02-27 DIAGNOSIS — I5040 Unspecified combined systolic (congestive) and diastolic (congestive) heart failure: Secondary | ICD-10-CM | POA: Diagnosis not present

## 2022-03-02 DIAGNOSIS — I5040 Unspecified combined systolic (congestive) and diastolic (congestive) heart failure: Secondary | ICD-10-CM | POA: Diagnosis not present

## 2022-03-02 DIAGNOSIS — M6281 Muscle weakness (generalized): Secondary | ICD-10-CM | POA: Diagnosis not present

## 2022-03-02 DIAGNOSIS — E0840 Diabetes mellitus due to underlying condition with diabetic neuropathy, unspecified: Secondary | ICD-10-CM | POA: Diagnosis not present

## 2022-03-02 DIAGNOSIS — F5101 Primary insomnia: Secondary | ICD-10-CM | POA: Diagnosis not present

## 2022-03-03 DIAGNOSIS — I5040 Unspecified combined systolic (congestive) and diastolic (congestive) heart failure: Secondary | ICD-10-CM | POA: Diagnosis not present

## 2022-03-03 DIAGNOSIS — E0840 Diabetes mellitus due to underlying condition with diabetic neuropathy, unspecified: Secondary | ICD-10-CM | POA: Diagnosis not present

## 2022-03-03 DIAGNOSIS — M6281 Muscle weakness (generalized): Secondary | ICD-10-CM | POA: Diagnosis not present

## 2022-03-04 DIAGNOSIS — I5040 Unspecified combined systolic (congestive) and diastolic (congestive) heart failure: Secondary | ICD-10-CM | POA: Diagnosis not present

## 2022-03-04 DIAGNOSIS — M6281 Muscle weakness (generalized): Secondary | ICD-10-CM | POA: Diagnosis not present

## 2022-03-04 DIAGNOSIS — E0840 Diabetes mellitus due to underlying condition with diabetic neuropathy, unspecified: Secondary | ICD-10-CM | POA: Diagnosis not present

## 2022-03-05 DIAGNOSIS — M6281 Muscle weakness (generalized): Secondary | ICD-10-CM | POA: Diagnosis not present

## 2022-03-05 DIAGNOSIS — I5040 Unspecified combined systolic (congestive) and diastolic (congestive) heart failure: Secondary | ICD-10-CM | POA: Diagnosis not present

## 2022-03-05 DIAGNOSIS — E0840 Diabetes mellitus due to underlying condition with diabetic neuropathy, unspecified: Secondary | ICD-10-CM | POA: Diagnosis not present

## 2022-03-06 DIAGNOSIS — M6281 Muscle weakness (generalized): Secondary | ICD-10-CM | POA: Diagnosis not present

## 2022-03-06 DIAGNOSIS — E0840 Diabetes mellitus due to underlying condition with diabetic neuropathy, unspecified: Secondary | ICD-10-CM | POA: Diagnosis not present

## 2022-03-06 DIAGNOSIS — I5040 Unspecified combined systolic (congestive) and diastolic (congestive) heart failure: Secondary | ICD-10-CM | POA: Diagnosis not present

## 2022-03-09 DIAGNOSIS — M6281 Muscle weakness (generalized): Secondary | ICD-10-CM | POA: Diagnosis not present

## 2022-03-09 DIAGNOSIS — I5040 Unspecified combined systolic (congestive) and diastolic (congestive) heart failure: Secondary | ICD-10-CM | POA: Diagnosis not present

## 2022-03-09 DIAGNOSIS — E0840 Diabetes mellitus due to underlying condition with diabetic neuropathy, unspecified: Secondary | ICD-10-CM | POA: Diagnosis not present

## 2022-03-10 DIAGNOSIS — M6281 Muscle weakness (generalized): Secondary | ICD-10-CM | POA: Diagnosis not present

## 2022-03-10 DIAGNOSIS — I5040 Unspecified combined systolic (congestive) and diastolic (congestive) heart failure: Secondary | ICD-10-CM | POA: Diagnosis not present

## 2022-03-10 DIAGNOSIS — E0840 Diabetes mellitus due to underlying condition with diabetic neuropathy, unspecified: Secondary | ICD-10-CM | POA: Diagnosis not present

## 2022-03-11 DIAGNOSIS — I10 Essential (primary) hypertension: Secondary | ICD-10-CM | POA: Diagnosis not present

## 2022-03-11 DIAGNOSIS — E119 Type 2 diabetes mellitus without complications: Secondary | ICD-10-CM | POA: Diagnosis not present

## 2022-03-11 DIAGNOSIS — E0840 Diabetes mellitus due to underlying condition with diabetic neuropathy, unspecified: Secondary | ICD-10-CM | POA: Diagnosis not present

## 2022-03-11 DIAGNOSIS — F329 Major depressive disorder, single episode, unspecified: Secondary | ICD-10-CM | POA: Diagnosis not present

## 2022-03-11 DIAGNOSIS — E785 Hyperlipidemia, unspecified: Secondary | ICD-10-CM | POA: Diagnosis not present

## 2022-03-11 DIAGNOSIS — M6281 Muscle weakness (generalized): Secondary | ICD-10-CM | POA: Diagnosis not present

## 2022-03-11 DIAGNOSIS — I5040 Unspecified combined systolic (congestive) and diastolic (congestive) heart failure: Secondary | ICD-10-CM | POA: Diagnosis not present

## 2022-03-12 DIAGNOSIS — E0840 Diabetes mellitus due to underlying condition with diabetic neuropathy, unspecified: Secondary | ICD-10-CM | POA: Diagnosis not present

## 2022-03-12 DIAGNOSIS — M6281 Muscle weakness (generalized): Secondary | ICD-10-CM | POA: Diagnosis not present

## 2022-03-12 DIAGNOSIS — I5040 Unspecified combined systolic (congestive) and diastolic (congestive) heart failure: Secondary | ICD-10-CM | POA: Diagnosis not present

## 2022-03-13 DIAGNOSIS — E0840 Diabetes mellitus due to underlying condition with diabetic neuropathy, unspecified: Secondary | ICD-10-CM | POA: Diagnosis not present

## 2022-03-13 DIAGNOSIS — M6281 Muscle weakness (generalized): Secondary | ICD-10-CM | POA: Diagnosis not present

## 2022-03-13 DIAGNOSIS — M25561 Pain in right knee: Secondary | ICD-10-CM | POA: Diagnosis not present

## 2022-03-13 DIAGNOSIS — I5040 Unspecified combined systolic (congestive) and diastolic (congestive) heart failure: Secondary | ICD-10-CM | POA: Diagnosis not present

## 2022-03-13 DIAGNOSIS — R296 Repeated falls: Secondary | ICD-10-CM | POA: Diagnosis not present

## 2022-03-16 DIAGNOSIS — E119 Type 2 diabetes mellitus without complications: Secondary | ICD-10-CM | POA: Diagnosis not present

## 2022-03-16 DIAGNOSIS — E785 Hyperlipidemia, unspecified: Secondary | ICD-10-CM | POA: Diagnosis not present

## 2022-03-16 DIAGNOSIS — I480 Paroxysmal atrial fibrillation: Secondary | ICD-10-CM | POA: Diagnosis not present

## 2022-03-16 DIAGNOSIS — I10 Essential (primary) hypertension: Secondary | ICD-10-CM | POA: Diagnosis not present

## 2022-03-17 DIAGNOSIS — I5040 Unspecified combined systolic (congestive) and diastolic (congestive) heart failure: Secondary | ICD-10-CM | POA: Diagnosis not present

## 2022-03-17 DIAGNOSIS — E0840 Diabetes mellitus due to underlying condition with diabetic neuropathy, unspecified: Secondary | ICD-10-CM | POA: Diagnosis not present

## 2022-03-17 DIAGNOSIS — M6281 Muscle weakness (generalized): Secondary | ICD-10-CM | POA: Diagnosis not present

## 2022-03-18 DIAGNOSIS — M6281 Muscle weakness (generalized): Secondary | ICD-10-CM | POA: Diagnosis not present

## 2022-03-18 DIAGNOSIS — E0840 Diabetes mellitus due to underlying condition with diabetic neuropathy, unspecified: Secondary | ICD-10-CM | POA: Diagnosis not present

## 2022-03-18 DIAGNOSIS — I5040 Unspecified combined systolic (congestive) and diastolic (congestive) heart failure: Secondary | ICD-10-CM | POA: Diagnosis not present

## 2022-03-19 DIAGNOSIS — M6281 Muscle weakness (generalized): Secondary | ICD-10-CM | POA: Diagnosis not present

## 2022-03-19 DIAGNOSIS — E0840 Diabetes mellitus due to underlying condition with diabetic neuropathy, unspecified: Secondary | ICD-10-CM | POA: Diagnosis not present

## 2022-03-19 DIAGNOSIS — I5040 Unspecified combined systolic (congestive) and diastolic (congestive) heart failure: Secondary | ICD-10-CM | POA: Diagnosis not present

## 2022-03-19 DIAGNOSIS — M47816 Spondylosis without myelopathy or radiculopathy, lumbar region: Secondary | ICD-10-CM | POA: Diagnosis not present

## 2022-03-19 DIAGNOSIS — I10 Essential (primary) hypertension: Secondary | ICD-10-CM | POA: Diagnosis not present

## 2022-03-19 DIAGNOSIS — E119 Type 2 diabetes mellitus without complications: Secondary | ICD-10-CM | POA: Diagnosis not present

## 2022-03-20 DIAGNOSIS — M6281 Muscle weakness (generalized): Secondary | ICD-10-CM | POA: Diagnosis not present

## 2022-03-20 DIAGNOSIS — I5040 Unspecified combined systolic (congestive) and diastolic (congestive) heart failure: Secondary | ICD-10-CM | POA: Diagnosis not present

## 2022-03-20 DIAGNOSIS — E0840 Diabetes mellitus due to underlying condition with diabetic neuropathy, unspecified: Secondary | ICD-10-CM | POA: Diagnosis not present

## 2022-03-23 DIAGNOSIS — I5040 Unspecified combined systolic (congestive) and diastolic (congestive) heart failure: Secondary | ICD-10-CM | POA: Diagnosis not present

## 2022-03-23 DIAGNOSIS — R296 Repeated falls: Secondary | ICD-10-CM | POA: Diagnosis not present

## 2022-03-23 DIAGNOSIS — E0840 Diabetes mellitus due to underlying condition with diabetic neuropathy, unspecified: Secondary | ICD-10-CM | POA: Diagnosis not present

## 2022-03-23 DIAGNOSIS — M6281 Muscle weakness (generalized): Secondary | ICD-10-CM | POA: Diagnosis not present

## 2022-03-24 DIAGNOSIS — M6281 Muscle weakness (generalized): Secondary | ICD-10-CM | POA: Diagnosis not present

## 2022-03-24 DIAGNOSIS — I5040 Unspecified combined systolic (congestive) and diastolic (congestive) heart failure: Secondary | ICD-10-CM | POA: Diagnosis not present

## 2022-03-24 DIAGNOSIS — E0840 Diabetes mellitus due to underlying condition with diabetic neuropathy, unspecified: Secondary | ICD-10-CM | POA: Diagnosis not present

## 2022-03-25 DIAGNOSIS — E118 Type 2 diabetes mellitus with unspecified complications: Secondary | ICD-10-CM | POA: Diagnosis not present

## 2022-03-25 DIAGNOSIS — E0840 Diabetes mellitus due to underlying condition with diabetic neuropathy, unspecified: Secondary | ICD-10-CM | POA: Diagnosis not present

## 2022-03-25 DIAGNOSIS — M6281 Muscle weakness (generalized): Secondary | ICD-10-CM | POA: Diagnosis not present

## 2022-03-25 DIAGNOSIS — I5040 Unspecified combined systolic (congestive) and diastolic (congestive) heart failure: Secondary | ICD-10-CM | POA: Diagnosis not present

## 2022-03-25 DIAGNOSIS — J449 Chronic obstructive pulmonary disease, unspecified: Secondary | ICD-10-CM | POA: Diagnosis not present

## 2022-03-26 DIAGNOSIS — M6281 Muscle weakness (generalized): Secondary | ICD-10-CM | POA: Diagnosis not present

## 2022-03-26 DIAGNOSIS — E0840 Diabetes mellitus due to underlying condition with diabetic neuropathy, unspecified: Secondary | ICD-10-CM | POA: Diagnosis not present

## 2022-03-26 DIAGNOSIS — I5040 Unspecified combined systolic (congestive) and diastolic (congestive) heart failure: Secondary | ICD-10-CM | POA: Diagnosis not present

## 2022-03-27 DIAGNOSIS — E0840 Diabetes mellitus due to underlying condition with diabetic neuropathy, unspecified: Secondary | ICD-10-CM | POA: Diagnosis not present

## 2022-03-27 DIAGNOSIS — I5040 Unspecified combined systolic (congestive) and diastolic (congestive) heart failure: Secondary | ICD-10-CM | POA: Diagnosis not present

## 2022-03-27 DIAGNOSIS — M6281 Muscle weakness (generalized): Secondary | ICD-10-CM | POA: Diagnosis not present

## 2022-04-01 DIAGNOSIS — E0840 Diabetes mellitus due to underlying condition with diabetic neuropathy, unspecified: Secondary | ICD-10-CM | POA: Diagnosis not present

## 2022-04-01 DIAGNOSIS — M6281 Muscle weakness (generalized): Secondary | ICD-10-CM | POA: Diagnosis not present

## 2022-04-01 DIAGNOSIS — I5040 Unspecified combined systolic (congestive) and diastolic (congestive) heart failure: Secondary | ICD-10-CM | POA: Diagnosis not present

## 2022-04-02 DIAGNOSIS — M6281 Muscle weakness (generalized): Secondary | ICD-10-CM | POA: Diagnosis not present

## 2022-04-02 DIAGNOSIS — E0840 Diabetes mellitus due to underlying condition with diabetic neuropathy, unspecified: Secondary | ICD-10-CM | POA: Diagnosis not present

## 2022-04-02 DIAGNOSIS — I5040 Unspecified combined systolic (congestive) and diastolic (congestive) heart failure: Secondary | ICD-10-CM | POA: Diagnosis not present

## 2022-04-03 DIAGNOSIS — E0840 Diabetes mellitus due to underlying condition with diabetic neuropathy, unspecified: Secondary | ICD-10-CM | POA: Diagnosis not present

## 2022-04-03 DIAGNOSIS — M6281 Muscle weakness (generalized): Secondary | ICD-10-CM | POA: Diagnosis not present

## 2022-04-03 DIAGNOSIS — I5040 Unspecified combined systolic (congestive) and diastolic (congestive) heart failure: Secondary | ICD-10-CM | POA: Diagnosis not present

## 2022-04-04 DIAGNOSIS — M6281 Muscle weakness (generalized): Secondary | ICD-10-CM | POA: Diagnosis not present

## 2022-04-04 DIAGNOSIS — I5040 Unspecified combined systolic (congestive) and diastolic (congestive) heart failure: Secondary | ICD-10-CM | POA: Diagnosis not present

## 2022-04-04 DIAGNOSIS — E0840 Diabetes mellitus due to underlying condition with diabetic neuropathy, unspecified: Secondary | ICD-10-CM | POA: Diagnosis not present

## 2022-04-05 DIAGNOSIS — I5040 Unspecified combined systolic (congestive) and diastolic (congestive) heart failure: Secondary | ICD-10-CM | POA: Diagnosis not present

## 2022-04-05 DIAGNOSIS — M6281 Muscle weakness (generalized): Secondary | ICD-10-CM | POA: Diagnosis not present

## 2022-04-05 DIAGNOSIS — E0840 Diabetes mellitus due to underlying condition with diabetic neuropathy, unspecified: Secondary | ICD-10-CM | POA: Diagnosis not present

## 2022-04-06 DIAGNOSIS — R079 Chest pain, unspecified: Secondary | ICD-10-CM | POA: Diagnosis not present

## 2022-04-06 DIAGNOSIS — E0840 Diabetes mellitus due to underlying condition with diabetic neuropathy, unspecified: Secondary | ICD-10-CM | POA: Diagnosis not present

## 2022-04-06 DIAGNOSIS — M6281 Muscle weakness (generalized): Secondary | ICD-10-CM | POA: Diagnosis not present

## 2022-04-06 DIAGNOSIS — I5040 Unspecified combined systolic (congestive) and diastolic (congestive) heart failure: Secondary | ICD-10-CM | POA: Diagnosis not present

## 2022-04-07 DIAGNOSIS — E0840 Diabetes mellitus due to underlying condition with diabetic neuropathy, unspecified: Secondary | ICD-10-CM | POA: Diagnosis not present

## 2022-04-07 DIAGNOSIS — R5383 Other fatigue: Secondary | ICD-10-CM | POA: Diagnosis not present

## 2022-04-07 DIAGNOSIS — G4700 Insomnia, unspecified: Secondary | ICD-10-CM | POA: Diagnosis not present

## 2022-04-07 DIAGNOSIS — F339 Major depressive disorder, recurrent, unspecified: Secondary | ICD-10-CM | POA: Diagnosis not present

## 2022-04-07 DIAGNOSIS — M6281 Muscle weakness (generalized): Secondary | ICD-10-CM | POA: Diagnosis not present

## 2022-04-07 DIAGNOSIS — R296 Repeated falls: Secondary | ICD-10-CM | POA: Diagnosis not present

## 2022-04-07 DIAGNOSIS — F250 Schizoaffective disorder, bipolar type: Secondary | ICD-10-CM | POA: Diagnosis not present

## 2022-04-07 DIAGNOSIS — I5040 Unspecified combined systolic (congestive) and diastolic (congestive) heart failure: Secondary | ICD-10-CM | POA: Diagnosis not present

## 2022-04-07 DIAGNOSIS — F419 Anxiety disorder, unspecified: Secondary | ICD-10-CM | POA: Diagnosis not present

## 2022-04-08 DIAGNOSIS — M6281 Muscle weakness (generalized): Secondary | ICD-10-CM | POA: Diagnosis not present

## 2022-04-08 DIAGNOSIS — E0840 Diabetes mellitus due to underlying condition with diabetic neuropathy, unspecified: Secondary | ICD-10-CM | POA: Diagnosis not present

## 2022-04-08 DIAGNOSIS — I5040 Unspecified combined systolic (congestive) and diastolic (congestive) heart failure: Secondary | ICD-10-CM | POA: Diagnosis not present

## 2022-04-09 DIAGNOSIS — I5040 Unspecified combined systolic (congestive) and diastolic (congestive) heart failure: Secondary | ICD-10-CM | POA: Diagnosis not present

## 2022-04-09 DIAGNOSIS — R338 Other retention of urine: Secondary | ICD-10-CM | POA: Diagnosis not present

## 2022-04-09 DIAGNOSIS — N390 Urinary tract infection, site not specified: Secondary | ICD-10-CM | POA: Diagnosis not present

## 2022-04-09 DIAGNOSIS — M6281 Muscle weakness (generalized): Secondary | ICD-10-CM | POA: Diagnosis not present

## 2022-04-09 DIAGNOSIS — E0840 Diabetes mellitus due to underlying condition with diabetic neuropathy, unspecified: Secondary | ICD-10-CM | POA: Diagnosis not present

## 2022-04-13 DIAGNOSIS — E119 Type 2 diabetes mellitus without complications: Secondary | ICD-10-CM | POA: Diagnosis not present

## 2022-04-13 DIAGNOSIS — I10 Essential (primary) hypertension: Secondary | ICD-10-CM | POA: Diagnosis not present

## 2022-04-13 DIAGNOSIS — J449 Chronic obstructive pulmonary disease, unspecified: Secondary | ICD-10-CM | POA: Diagnosis not present

## 2022-04-13 DIAGNOSIS — I5043 Acute on chronic combined systolic (congestive) and diastolic (congestive) heart failure: Secondary | ICD-10-CM | POA: Diagnosis not present

## 2022-04-17 DIAGNOSIS — F419 Anxiety disorder, unspecified: Secondary | ICD-10-CM | POA: Diagnosis not present

## 2022-04-17 DIAGNOSIS — F329 Major depressive disorder, single episode, unspecified: Secondary | ICD-10-CM | POA: Diagnosis not present

## 2022-04-17 DIAGNOSIS — F319 Bipolar disorder, unspecified: Secondary | ICD-10-CM | POA: Diagnosis not present

## 2022-04-17 DIAGNOSIS — F250 Schizoaffective disorder, bipolar type: Secondary | ICD-10-CM | POA: Diagnosis not present

## 2022-04-21 DIAGNOSIS — E785 Hyperlipidemia, unspecified: Secondary | ICD-10-CM | POA: Diagnosis not present

## 2022-04-21 DIAGNOSIS — E119 Type 2 diabetes mellitus without complications: Secondary | ICD-10-CM | POA: Diagnosis not present

## 2022-04-21 DIAGNOSIS — I480 Paroxysmal atrial fibrillation: Secondary | ICD-10-CM | POA: Diagnosis not present

## 2022-04-21 DIAGNOSIS — I10 Essential (primary) hypertension: Secondary | ICD-10-CM | POA: Diagnosis not present

## 2022-04-27 DIAGNOSIS — G4700 Insomnia, unspecified: Secondary | ICD-10-CM | POA: Diagnosis not present

## 2022-04-27 DIAGNOSIS — F339 Major depressive disorder, recurrent, unspecified: Secondary | ICD-10-CM | POA: Diagnosis not present

## 2022-04-27 DIAGNOSIS — F419 Anxiety disorder, unspecified: Secondary | ICD-10-CM | POA: Diagnosis not present

## 2022-04-27 DIAGNOSIS — F250 Schizoaffective disorder, bipolar type: Secondary | ICD-10-CM | POA: Diagnosis not present

## 2022-05-06 DIAGNOSIS — Z978 Presence of other specified devices: Secondary | ICD-10-CM | POA: Diagnosis not present

## 2022-05-11 DIAGNOSIS — E119 Type 2 diabetes mellitus without complications: Secondary | ICD-10-CM | POA: Diagnosis not present

## 2022-05-11 DIAGNOSIS — N319 Neuromuscular dysfunction of bladder, unspecified: Secondary | ICD-10-CM | POA: Diagnosis not present

## 2022-05-11 DIAGNOSIS — F329 Major depressive disorder, single episode, unspecified: Secondary | ICD-10-CM | POA: Diagnosis not present

## 2022-05-11 DIAGNOSIS — E785 Hyperlipidemia, unspecified: Secondary | ICD-10-CM | POA: Diagnosis not present

## 2022-05-21 DIAGNOSIS — F0631 Mood disorder due to known physiological condition with depressive features: Secondary | ICD-10-CM | POA: Diagnosis not present

## 2022-05-21 DIAGNOSIS — R1310 Dysphagia, unspecified: Secondary | ICD-10-CM | POA: Diagnosis not present

## 2022-05-21 DIAGNOSIS — I10 Essential (primary) hypertension: Secondary | ICD-10-CM | POA: Diagnosis not present

## 2022-05-21 DIAGNOSIS — E119 Type 2 diabetes mellitus without complications: Secondary | ICD-10-CM | POA: Diagnosis not present

## 2022-05-26 DIAGNOSIS — E785 Hyperlipidemia, unspecified: Secondary | ICD-10-CM | POA: Diagnosis not present

## 2022-05-26 DIAGNOSIS — I480 Paroxysmal atrial fibrillation: Secondary | ICD-10-CM | POA: Diagnosis not present

## 2022-05-26 DIAGNOSIS — E119 Type 2 diabetes mellitus without complications: Secondary | ICD-10-CM | POA: Diagnosis not present

## 2022-05-26 DIAGNOSIS — I10 Essential (primary) hypertension: Secondary | ICD-10-CM | POA: Diagnosis not present

## 2022-06-02 DIAGNOSIS — R296 Repeated falls: Secondary | ICD-10-CM | POA: Diagnosis not present

## 2022-06-03 DIAGNOSIS — R296 Repeated falls: Secondary | ICD-10-CM | POA: Diagnosis not present

## 2022-06-03 DIAGNOSIS — J449 Chronic obstructive pulmonary disease, unspecified: Secondary | ICD-10-CM | POA: Diagnosis not present

## 2022-06-03 DIAGNOSIS — E118 Type 2 diabetes mellitus with unspecified complications: Secondary | ICD-10-CM | POA: Diagnosis not present

## 2022-06-04 DIAGNOSIS — M6281 Muscle weakness (generalized): Secondary | ICD-10-CM | POA: Diagnosis not present

## 2022-06-04 DIAGNOSIS — I5040 Unspecified combined systolic (congestive) and diastolic (congestive) heart failure: Secondary | ICD-10-CM | POA: Diagnosis not present

## 2022-06-04 DIAGNOSIS — E0840 Diabetes mellitus due to underlying condition with diabetic neuropathy, unspecified: Secondary | ICD-10-CM | POA: Diagnosis not present

## 2022-06-05 DIAGNOSIS — E0840 Diabetes mellitus due to underlying condition with diabetic neuropathy, unspecified: Secondary | ICD-10-CM | POA: Diagnosis not present

## 2022-06-05 DIAGNOSIS — I5040 Unspecified combined systolic (congestive) and diastolic (congestive) heart failure: Secondary | ICD-10-CM | POA: Diagnosis not present

## 2022-06-05 DIAGNOSIS — M6281 Muscle weakness (generalized): Secondary | ICD-10-CM | POA: Diagnosis not present

## 2022-06-07 DIAGNOSIS — M25521 Pain in right elbow: Secondary | ICD-10-CM | POA: Diagnosis not present

## 2022-06-08 DIAGNOSIS — E785 Hyperlipidemia, unspecified: Secondary | ICD-10-CM | POA: Diagnosis not present

## 2022-06-08 DIAGNOSIS — E119 Type 2 diabetes mellitus without complications: Secondary | ICD-10-CM | POA: Diagnosis not present

## 2022-06-08 DIAGNOSIS — M6281 Muscle weakness (generalized): Secondary | ICD-10-CM | POA: Diagnosis not present

## 2022-06-08 DIAGNOSIS — I480 Paroxysmal atrial fibrillation: Secondary | ICD-10-CM | POA: Diagnosis not present

## 2022-06-08 DIAGNOSIS — I5040 Unspecified combined systolic (congestive) and diastolic (congestive) heart failure: Secondary | ICD-10-CM | POA: Diagnosis not present

## 2022-06-08 DIAGNOSIS — I10 Essential (primary) hypertension: Secondary | ICD-10-CM | POA: Diagnosis not present

## 2022-06-08 DIAGNOSIS — E0840 Diabetes mellitus due to underlying condition with diabetic neuropathy, unspecified: Secondary | ICD-10-CM | POA: Diagnosis not present

## 2022-06-09 DIAGNOSIS — M6281 Muscle weakness (generalized): Secondary | ICD-10-CM | POA: Diagnosis not present

## 2022-06-09 DIAGNOSIS — I5040 Unspecified combined systolic (congestive) and diastolic (congestive) heart failure: Secondary | ICD-10-CM | POA: Diagnosis not present

## 2022-06-09 DIAGNOSIS — E0840 Diabetes mellitus due to underlying condition with diabetic neuropathy, unspecified: Secondary | ICD-10-CM | POA: Diagnosis not present

## 2022-06-10 DIAGNOSIS — I5040 Unspecified combined systolic (congestive) and diastolic (congestive) heart failure: Secondary | ICD-10-CM | POA: Diagnosis not present

## 2022-06-10 DIAGNOSIS — R4182 Altered mental status, unspecified: Secondary | ICD-10-CM | POA: Diagnosis not present

## 2022-06-10 DIAGNOSIS — E0840 Diabetes mellitus due to underlying condition with diabetic neuropathy, unspecified: Secondary | ICD-10-CM | POA: Diagnosis not present

## 2022-06-10 DIAGNOSIS — M6281 Muscle weakness (generalized): Secondary | ICD-10-CM | POA: Diagnosis not present

## 2022-06-11 DIAGNOSIS — M6281 Muscle weakness (generalized): Secondary | ICD-10-CM | POA: Diagnosis not present

## 2022-06-11 DIAGNOSIS — N390 Urinary tract infection, site not specified: Secondary | ICD-10-CM | POA: Diagnosis not present

## 2022-06-11 DIAGNOSIS — E0840 Diabetes mellitus due to underlying condition with diabetic neuropathy, unspecified: Secondary | ICD-10-CM | POA: Diagnosis not present

## 2022-06-11 DIAGNOSIS — I5040 Unspecified combined systolic (congestive) and diastolic (congestive) heart failure: Secondary | ICD-10-CM | POA: Diagnosis not present

## 2022-06-12 DIAGNOSIS — M6281 Muscle weakness (generalized): Secondary | ICD-10-CM | POA: Diagnosis not present

## 2022-06-12 DIAGNOSIS — E0840 Diabetes mellitus due to underlying condition with diabetic neuropathy, unspecified: Secondary | ICD-10-CM | POA: Diagnosis not present

## 2022-06-12 DIAGNOSIS — I5040 Unspecified combined systolic (congestive) and diastolic (congestive) heart failure: Secondary | ICD-10-CM | POA: Diagnosis not present

## 2022-06-15 DIAGNOSIS — I5040 Unspecified combined systolic (congestive) and diastolic (congestive) heart failure: Secondary | ICD-10-CM | POA: Diagnosis not present

## 2022-06-15 DIAGNOSIS — E0840 Diabetes mellitus due to underlying condition with diabetic neuropathy, unspecified: Secondary | ICD-10-CM | POA: Diagnosis not present

## 2022-06-15 DIAGNOSIS — M6281 Muscle weakness (generalized): Secondary | ICD-10-CM | POA: Diagnosis not present

## 2022-06-15 IMAGING — DX DG CHEST 2V
2 series · 2 of 2 positions shown · non-contrast
Comparison: 03/07/2020

CLINICAL DATA: Chest pain

EXAM:
CHEST - 2 VIEW

[w chest pa]
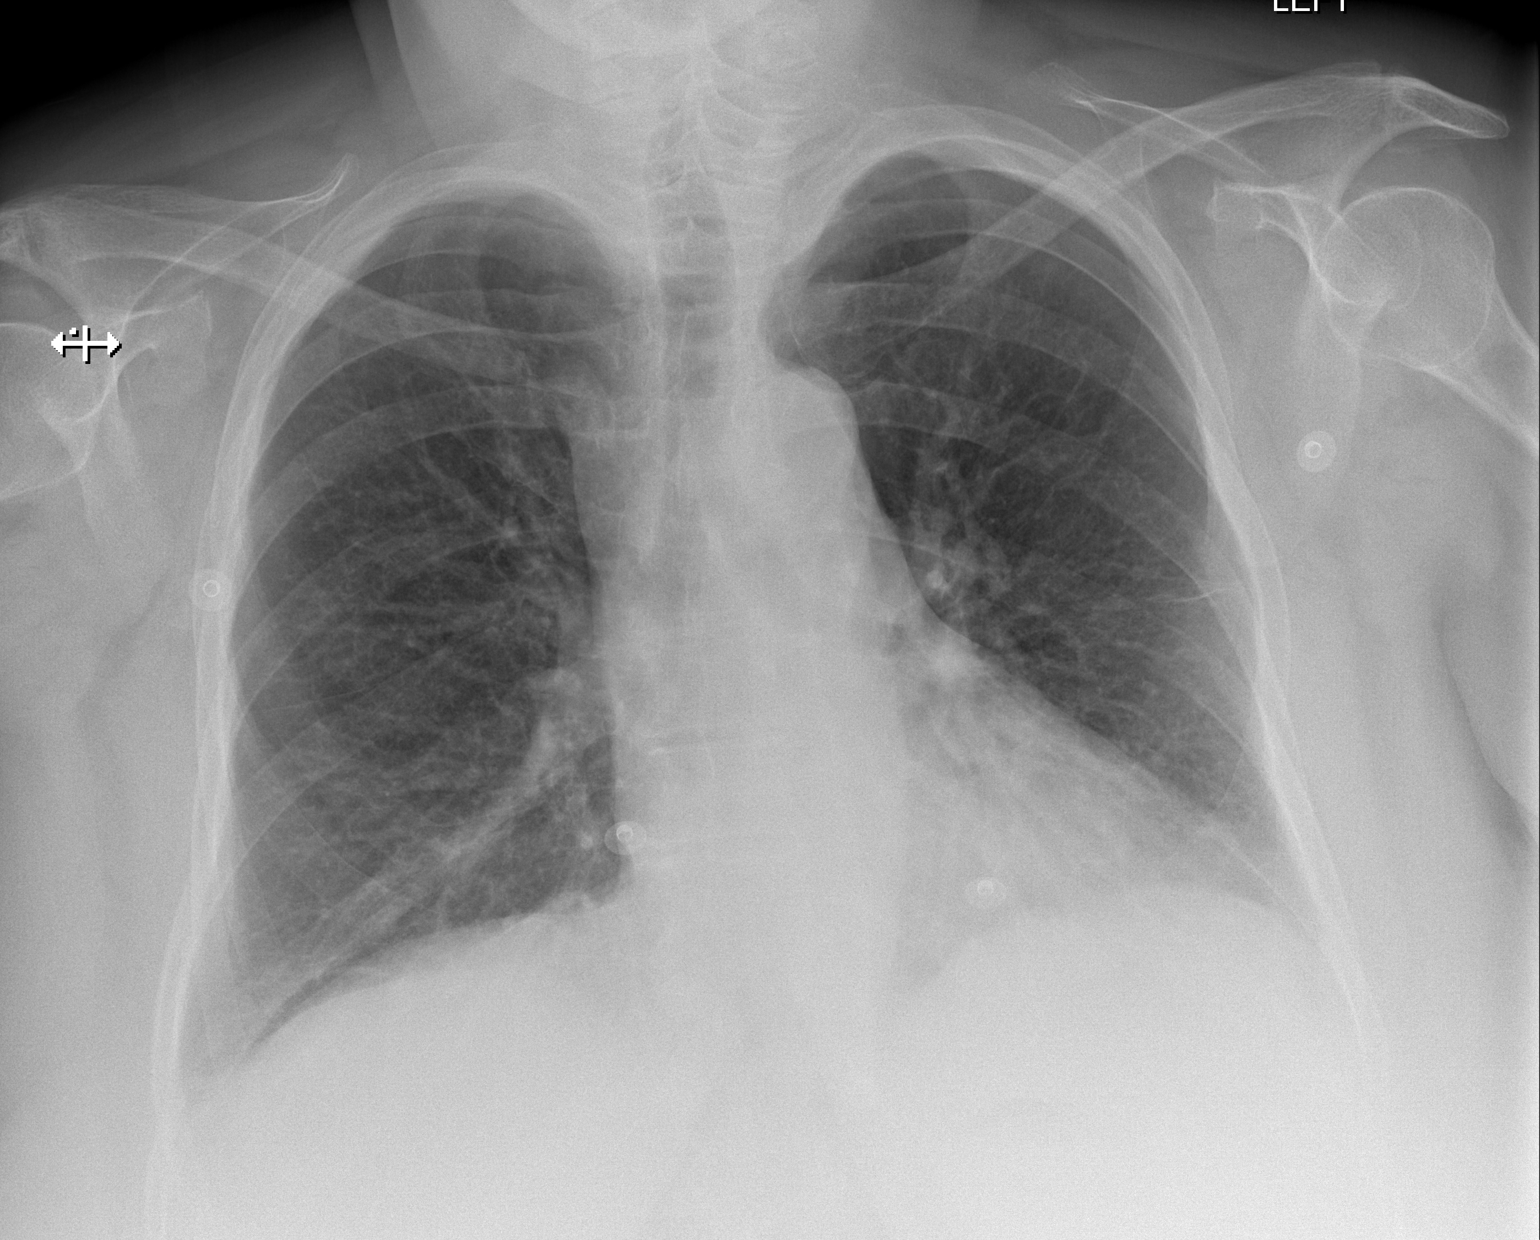

[w chest lat]
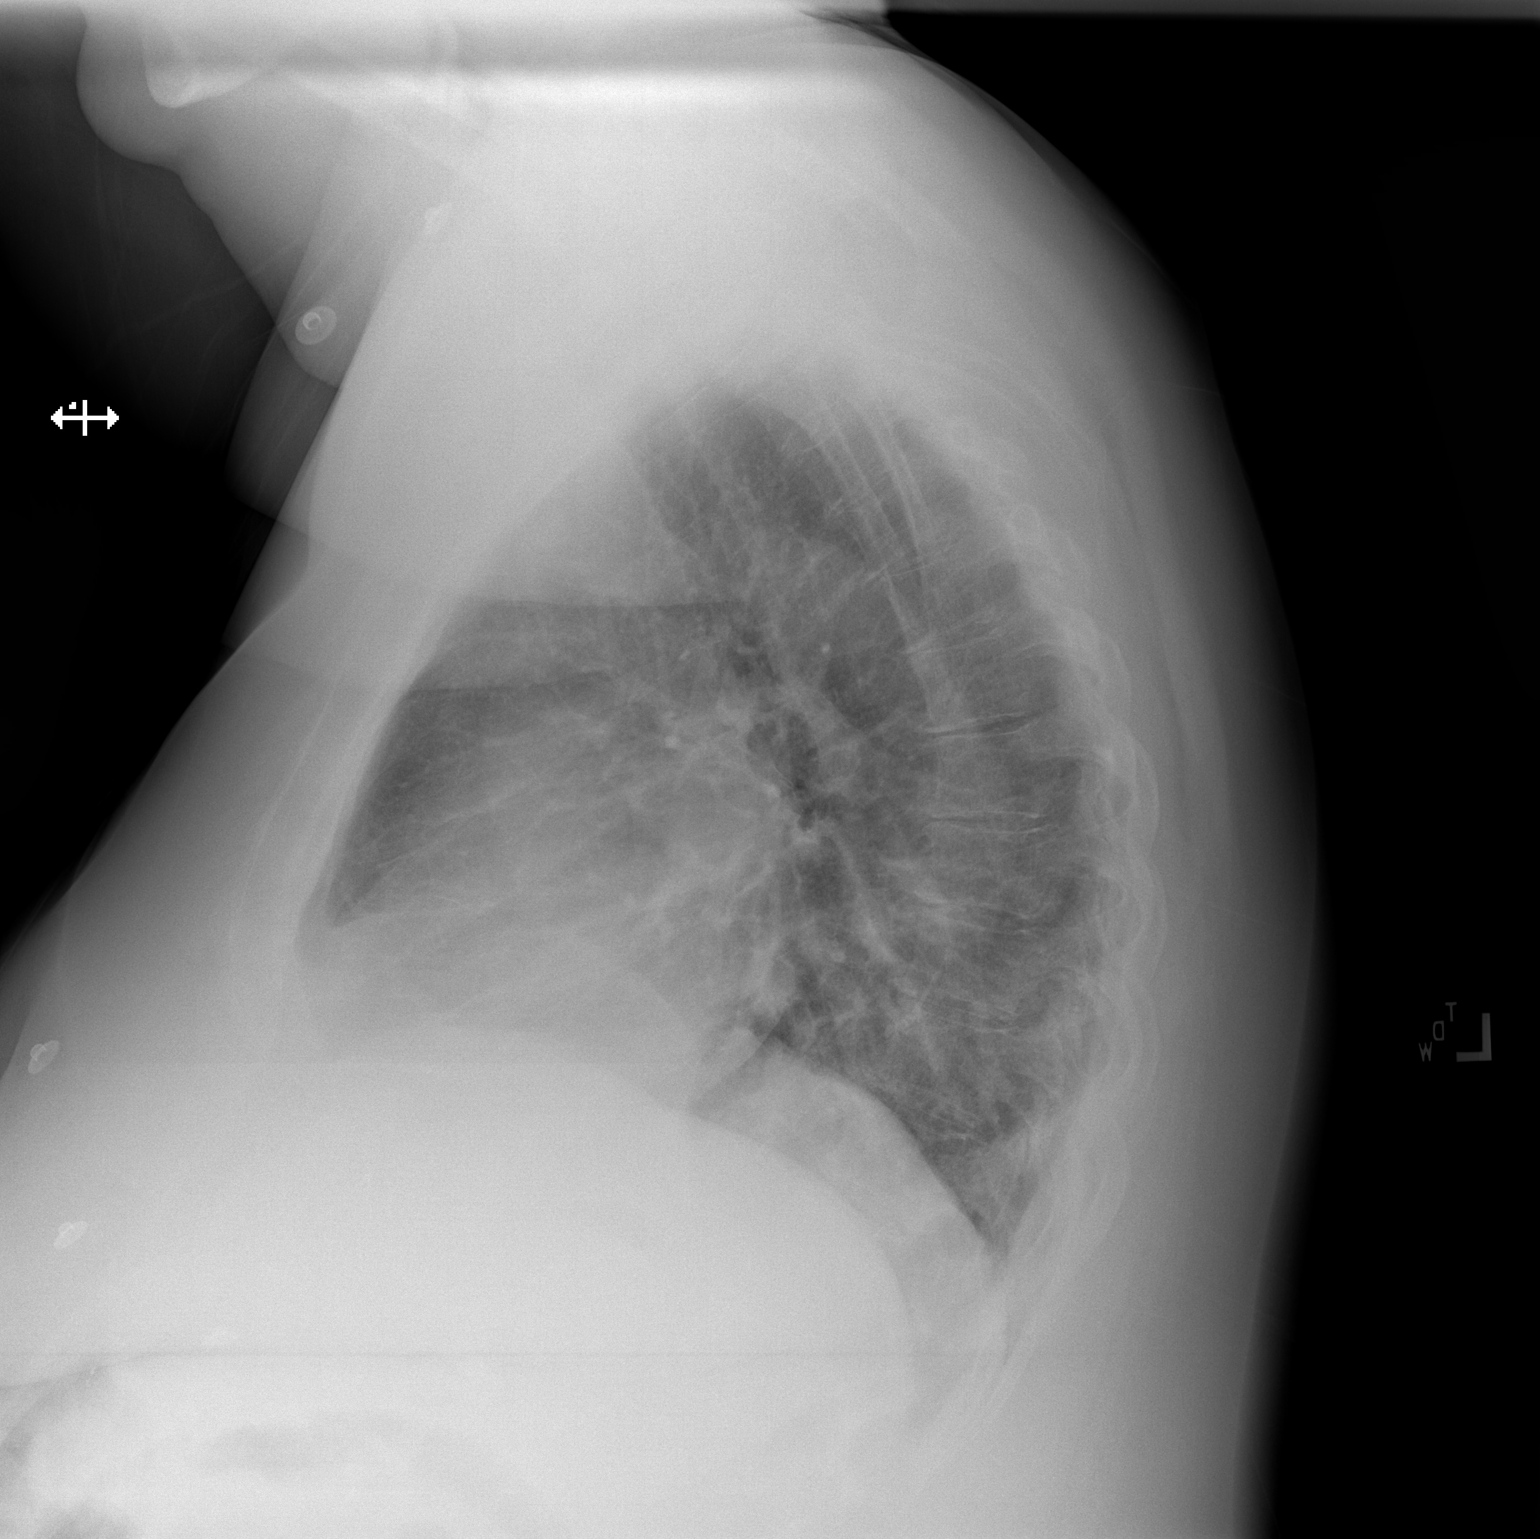

[2 of 2 positions shown; findings below may reference images not displayed]

FINDINGS: Streaky atelectasis at the bases. No consolidation or effusion.
Normal cardiomediastinal silhouette. No pneumothorax.
IMPRESSION: No active cardiopulmonary disease.  Streaky basilar atelectasis

## 2022-06-16 DIAGNOSIS — M6281 Muscle weakness (generalized): Secondary | ICD-10-CM | POA: Diagnosis not present

## 2022-06-16 DIAGNOSIS — I5040 Unspecified combined systolic (congestive) and diastolic (congestive) heart failure: Secondary | ICD-10-CM | POA: Diagnosis not present

## 2022-06-16 DIAGNOSIS — E0840 Diabetes mellitus due to underlying condition with diabetic neuropathy, unspecified: Secondary | ICD-10-CM | POA: Diagnosis not present

## 2022-06-17 DIAGNOSIS — E0840 Diabetes mellitus due to underlying condition with diabetic neuropathy, unspecified: Secondary | ICD-10-CM | POA: Diagnosis not present

## 2022-06-17 DIAGNOSIS — M6281 Muscle weakness (generalized): Secondary | ICD-10-CM | POA: Diagnosis not present

## 2022-06-17 DIAGNOSIS — I5040 Unspecified combined systolic (congestive) and diastolic (congestive) heart failure: Secondary | ICD-10-CM | POA: Diagnosis not present

## 2022-06-18 DIAGNOSIS — M6281 Muscle weakness (generalized): Secondary | ICD-10-CM | POA: Diagnosis not present

## 2022-06-18 DIAGNOSIS — J449 Chronic obstructive pulmonary disease, unspecified: Secondary | ICD-10-CM | POA: Diagnosis not present

## 2022-06-18 DIAGNOSIS — M109 Gout, unspecified: Secondary | ICD-10-CM | POA: Diagnosis not present

## 2022-06-18 DIAGNOSIS — E119 Type 2 diabetes mellitus without complications: Secondary | ICD-10-CM | POA: Diagnosis not present

## 2022-06-18 DIAGNOSIS — M47816 Spondylosis without myelopathy or radiculopathy, lumbar region: Secondary | ICD-10-CM | POA: Diagnosis not present

## 2022-06-18 DIAGNOSIS — E0840 Diabetes mellitus due to underlying condition with diabetic neuropathy, unspecified: Secondary | ICD-10-CM | POA: Diagnosis not present

## 2022-06-18 DIAGNOSIS — I5040 Unspecified combined systolic (congestive) and diastolic (congestive) heart failure: Secondary | ICD-10-CM | POA: Diagnosis not present

## 2022-06-19 DIAGNOSIS — M6281 Muscle weakness (generalized): Secondary | ICD-10-CM | POA: Diagnosis not present

## 2022-06-19 DIAGNOSIS — I5040 Unspecified combined systolic (congestive) and diastolic (congestive) heart failure: Secondary | ICD-10-CM | POA: Diagnosis not present

## 2022-06-19 DIAGNOSIS — E0840 Diabetes mellitus due to underlying condition with diabetic neuropathy, unspecified: Secondary | ICD-10-CM | POA: Diagnosis not present

## 2022-06-20 DIAGNOSIS — M6281 Muscle weakness (generalized): Secondary | ICD-10-CM | POA: Diagnosis not present

## 2022-06-20 DIAGNOSIS — E0840 Diabetes mellitus due to underlying condition with diabetic neuropathy, unspecified: Secondary | ICD-10-CM | POA: Diagnosis not present

## 2022-06-20 DIAGNOSIS — I5040 Unspecified combined systolic (congestive) and diastolic (congestive) heart failure: Secondary | ICD-10-CM | POA: Diagnosis not present

## 2022-06-23 DIAGNOSIS — E162 Hypoglycemia, unspecified: Secondary | ICD-10-CM | POA: Diagnosis not present

## 2022-06-23 DIAGNOSIS — I5040 Unspecified combined systolic (congestive) and diastolic (congestive) heart failure: Secondary | ICD-10-CM | POA: Diagnosis not present

## 2022-06-23 DIAGNOSIS — M6281 Muscle weakness (generalized): Secondary | ICD-10-CM | POA: Diagnosis not present

## 2022-06-23 DIAGNOSIS — F339 Major depressive disorder, recurrent, unspecified: Secondary | ICD-10-CM | POA: Diagnosis not present

## 2022-06-23 DIAGNOSIS — G4700 Insomnia, unspecified: Secondary | ICD-10-CM | POA: Diagnosis not present

## 2022-06-23 DIAGNOSIS — F250 Schizoaffective disorder, bipolar type: Secondary | ICD-10-CM | POA: Diagnosis not present

## 2022-06-23 DIAGNOSIS — E0840 Diabetes mellitus due to underlying condition with diabetic neuropathy, unspecified: Secondary | ICD-10-CM | POA: Diagnosis not present

## 2022-06-23 DIAGNOSIS — M546 Pain in thoracic spine: Secondary | ICD-10-CM | POA: Diagnosis not present

## 2022-06-23 DIAGNOSIS — F419 Anxiety disorder, unspecified: Secondary | ICD-10-CM | POA: Diagnosis not present

## 2022-06-23 IMAGING — CR DG CHEST 1V
1 series · 1 of 1 positions shown · non-contrast
Comparison: October 28, 2020.

CLINICAL DATA: Chest pain.

EXAM:
CHEST  1 VIEW

[chest ap]
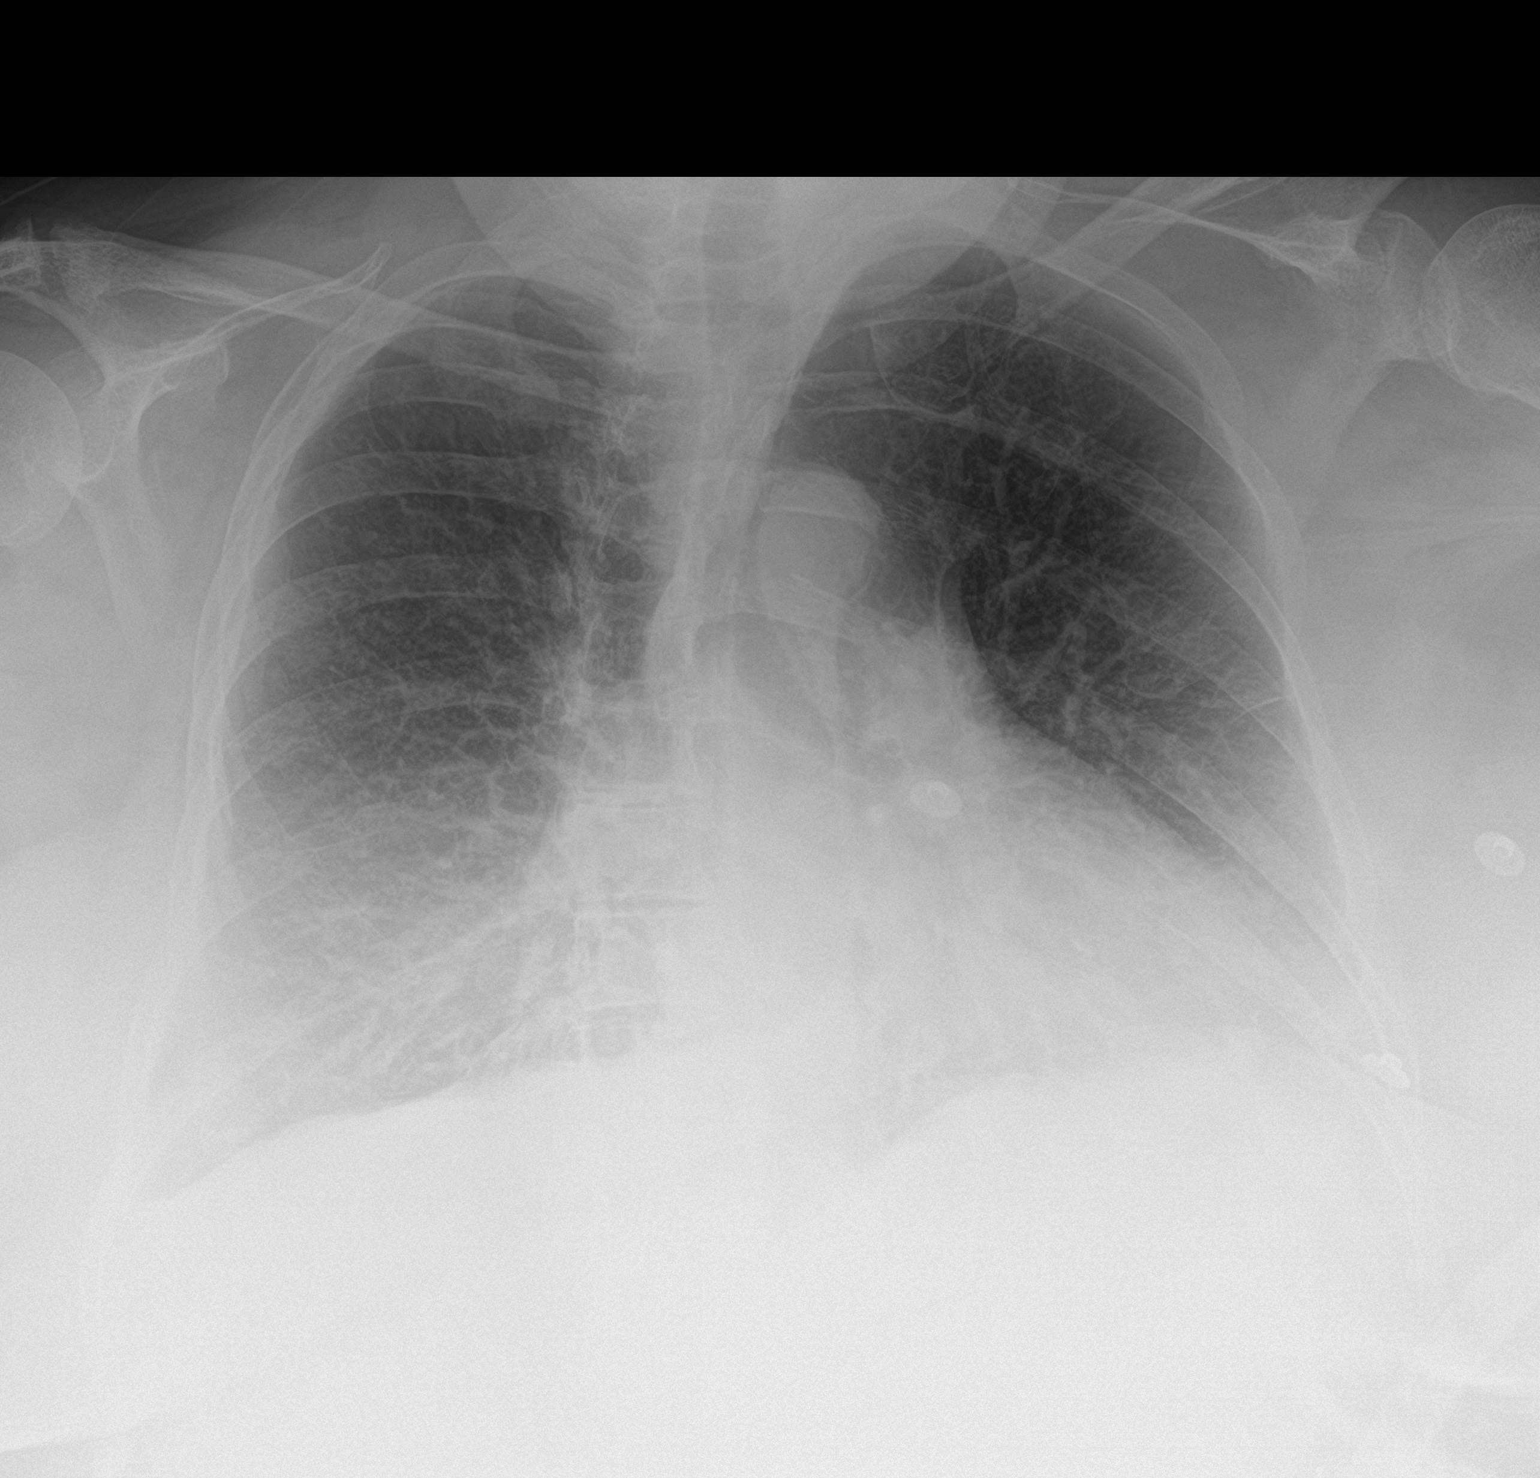

[1 of 1 positions shown; findings below may reference images not displayed]

FINDINGS: The heart size and mediastinal contours are within normal limits.
Bibasilar opacities are noted, but it is uncertain if this actually
represents atelectasis or infiltrates, or simply artifact overlying
soft tissue. The visualized skeletal structures are unremarkable.
IMPRESSION: Bibasilar opacities are noted as described above. It is uncertain if
these represent artifact from overlying soft tissue or true
parenchymal opacities.

## 2022-06-24 DIAGNOSIS — R296 Repeated falls: Secondary | ICD-10-CM | POA: Diagnosis not present

## 2022-06-24 DIAGNOSIS — E119 Type 2 diabetes mellitus without complications: Secondary | ICD-10-CM | POA: Diagnosis not present

## 2022-06-24 DIAGNOSIS — E0840 Diabetes mellitus due to underlying condition with diabetic neuropathy, unspecified: Secondary | ICD-10-CM | POA: Diagnosis not present

## 2022-06-24 DIAGNOSIS — I5040 Unspecified combined systolic (congestive) and diastolic (congestive) heart failure: Secondary | ICD-10-CM | POA: Diagnosis not present

## 2022-06-24 DIAGNOSIS — E162 Hypoglycemia, unspecified: Secondary | ICD-10-CM | POA: Diagnosis not present

## 2022-06-24 DIAGNOSIS — M6281 Muscle weakness (generalized): Secondary | ICD-10-CM | POA: Diagnosis not present

## 2022-06-25 DIAGNOSIS — M6281 Muscle weakness (generalized): Secondary | ICD-10-CM | POA: Diagnosis not present

## 2022-06-25 DIAGNOSIS — E0840 Diabetes mellitus due to underlying condition with diabetic neuropathy, unspecified: Secondary | ICD-10-CM | POA: Diagnosis not present

## 2022-06-25 DIAGNOSIS — N390 Urinary tract infection, site not specified: Secondary | ICD-10-CM | POA: Diagnosis not present

## 2022-06-25 DIAGNOSIS — I10 Essential (primary) hypertension: Secondary | ICD-10-CM | POA: Diagnosis not present

## 2022-06-25 DIAGNOSIS — I5040 Unspecified combined systolic (congestive) and diastolic (congestive) heart failure: Secondary | ICD-10-CM | POA: Diagnosis not present

## 2022-06-26 DIAGNOSIS — I5040 Unspecified combined systolic (congestive) and diastolic (congestive) heart failure: Secondary | ICD-10-CM | POA: Diagnosis not present

## 2022-06-26 DIAGNOSIS — M6281 Muscle weakness (generalized): Secondary | ICD-10-CM | POA: Diagnosis not present

## 2022-06-26 DIAGNOSIS — E0840 Diabetes mellitus due to underlying condition with diabetic neuropathy, unspecified: Secondary | ICD-10-CM | POA: Diagnosis not present

## 2022-06-29 ENCOUNTER — Encounter (HOSPITAL_BASED_OUTPATIENT_CLINIC_OR_DEPARTMENT_OTHER): Payer: Self-pay

## 2022-06-29 DIAGNOSIS — I5040 Unspecified combined systolic (congestive) and diastolic (congestive) heart failure: Secondary | ICD-10-CM | POA: Diagnosis not present

## 2022-06-29 DIAGNOSIS — G4733 Obstructive sleep apnea (adult) (pediatric): Secondary | ICD-10-CM

## 2022-06-29 DIAGNOSIS — M6281 Muscle weakness (generalized): Secondary | ICD-10-CM | POA: Diagnosis not present

## 2022-06-29 DIAGNOSIS — E0840 Diabetes mellitus due to underlying condition with diabetic neuropathy, unspecified: Secondary | ICD-10-CM | POA: Diagnosis not present

## 2022-06-30 DIAGNOSIS — M6281 Muscle weakness (generalized): Secondary | ICD-10-CM | POA: Diagnosis not present

## 2022-06-30 DIAGNOSIS — E0840 Diabetes mellitus due to underlying condition with diabetic neuropathy, unspecified: Secondary | ICD-10-CM | POA: Diagnosis not present

## 2022-06-30 DIAGNOSIS — I10 Essential (primary) hypertension: Secondary | ICD-10-CM | POA: Diagnosis not present

## 2022-06-30 DIAGNOSIS — I5040 Unspecified combined systolic (congestive) and diastolic (congestive) heart failure: Secondary | ICD-10-CM | POA: Diagnosis not present

## 2022-06-30 DIAGNOSIS — I480 Paroxysmal atrial fibrillation: Secondary | ICD-10-CM | POA: Diagnosis not present

## 2022-06-30 DIAGNOSIS — E119 Type 2 diabetes mellitus without complications: Secondary | ICD-10-CM | POA: Diagnosis not present

## 2022-06-30 DIAGNOSIS — E785 Hyperlipidemia, unspecified: Secondary | ICD-10-CM | POA: Diagnosis not present

## 2022-07-01 DIAGNOSIS — M6281 Muscle weakness (generalized): Secondary | ICD-10-CM | POA: Diagnosis not present

## 2022-07-01 DIAGNOSIS — I5040 Unspecified combined systolic (congestive) and diastolic (congestive) heart failure: Secondary | ICD-10-CM | POA: Diagnosis not present

## 2022-07-01 DIAGNOSIS — E0840 Diabetes mellitus due to underlying condition with diabetic neuropathy, unspecified: Secondary | ICD-10-CM | POA: Diagnosis not present

## 2022-07-03 DIAGNOSIS — M6281 Muscle weakness (generalized): Secondary | ICD-10-CM | POA: Diagnosis not present

## 2022-07-03 DIAGNOSIS — E0840 Diabetes mellitus due to underlying condition with diabetic neuropathy, unspecified: Secondary | ICD-10-CM | POA: Diagnosis not present

## 2022-07-03 DIAGNOSIS — I5040 Unspecified combined systolic (congestive) and diastolic (congestive) heart failure: Secondary | ICD-10-CM | POA: Diagnosis not present

## 2022-07-04 DIAGNOSIS — E0840 Diabetes mellitus due to underlying condition with diabetic neuropathy, unspecified: Secondary | ICD-10-CM | POA: Diagnosis not present

## 2022-07-04 DIAGNOSIS — M6281 Muscle weakness (generalized): Secondary | ICD-10-CM | POA: Diagnosis not present

## 2022-07-04 DIAGNOSIS — I5040 Unspecified combined systolic (congestive) and diastolic (congestive) heart failure: Secondary | ICD-10-CM | POA: Diagnosis not present

## 2022-07-06 DIAGNOSIS — I5040 Unspecified combined systolic (congestive) and diastolic (congestive) heart failure: Secondary | ICD-10-CM | POA: Diagnosis not present

## 2022-07-06 DIAGNOSIS — E0840 Diabetes mellitus due to underlying condition with diabetic neuropathy, unspecified: Secondary | ICD-10-CM | POA: Diagnosis not present

## 2022-07-06 DIAGNOSIS — M6281 Muscle weakness (generalized): Secondary | ICD-10-CM | POA: Diagnosis not present

## 2022-07-07 DIAGNOSIS — M6281 Muscle weakness (generalized): Secondary | ICD-10-CM | POA: Diagnosis not present

## 2022-07-07 DIAGNOSIS — F411 Generalized anxiety disorder: Secondary | ICD-10-CM | POA: Diagnosis not present

## 2022-07-07 DIAGNOSIS — F331 Major depressive disorder, recurrent, moderate: Secondary | ICD-10-CM | POA: Diagnosis not present

## 2022-07-07 DIAGNOSIS — I5040 Unspecified combined systolic (congestive) and diastolic (congestive) heart failure: Secondary | ICD-10-CM | POA: Diagnosis not present

## 2022-07-07 DIAGNOSIS — E0840 Diabetes mellitus due to underlying condition with diabetic neuropathy, unspecified: Secondary | ICD-10-CM | POA: Diagnosis not present

## 2022-07-08 DIAGNOSIS — E0840 Diabetes mellitus due to underlying condition with diabetic neuropathy, unspecified: Secondary | ICD-10-CM | POA: Diagnosis not present

## 2022-07-08 DIAGNOSIS — M6281 Muscle weakness (generalized): Secondary | ICD-10-CM | POA: Diagnosis not present

## 2022-07-08 DIAGNOSIS — I5040 Unspecified combined systolic (congestive) and diastolic (congestive) heart failure: Secondary | ICD-10-CM | POA: Diagnosis not present

## 2022-07-09 DIAGNOSIS — M6281 Muscle weakness (generalized): Secondary | ICD-10-CM | POA: Diagnosis not present

## 2022-07-09 DIAGNOSIS — E0840 Diabetes mellitus due to underlying condition with diabetic neuropathy, unspecified: Secondary | ICD-10-CM | POA: Diagnosis not present

## 2022-07-09 DIAGNOSIS — I5040 Unspecified combined systolic (congestive) and diastolic (congestive) heart failure: Secondary | ICD-10-CM | POA: Diagnosis not present

## 2022-07-10 DIAGNOSIS — L97129 Non-pressure chronic ulcer of left thigh with unspecified severity: Secondary | ICD-10-CM | POA: Diagnosis not present

## 2022-07-10 DIAGNOSIS — I5040 Unspecified combined systolic (congestive) and diastolic (congestive) heart failure: Secondary | ICD-10-CM | POA: Diagnosis not present

## 2022-07-10 DIAGNOSIS — M47816 Spondylosis without myelopathy or radiculopathy, lumbar region: Secondary | ICD-10-CM | POA: Diagnosis not present

## 2022-07-10 DIAGNOSIS — E0840 Diabetes mellitus due to underlying condition with diabetic neuropathy, unspecified: Secondary | ICD-10-CM | POA: Diagnosis not present

## 2022-07-10 DIAGNOSIS — J449 Chronic obstructive pulmonary disease, unspecified: Secondary | ICD-10-CM | POA: Diagnosis not present

## 2022-07-10 DIAGNOSIS — M6281 Muscle weakness (generalized): Secondary | ICD-10-CM | POA: Diagnosis not present

## 2022-07-12 DIAGNOSIS — M6281 Muscle weakness (generalized): Secondary | ICD-10-CM | POA: Diagnosis not present

## 2022-07-12 DIAGNOSIS — I5040 Unspecified combined systolic (congestive) and diastolic (congestive) heart failure: Secondary | ICD-10-CM | POA: Diagnosis not present

## 2022-07-12 DIAGNOSIS — E0840 Diabetes mellitus due to underlying condition with diabetic neuropathy, unspecified: Secondary | ICD-10-CM | POA: Diagnosis not present

## 2022-07-13 DIAGNOSIS — I5040 Unspecified combined systolic (congestive) and diastolic (congestive) heart failure: Secondary | ICD-10-CM | POA: Diagnosis not present

## 2022-07-13 DIAGNOSIS — F250 Schizoaffective disorder, bipolar type: Secondary | ICD-10-CM | POA: Diagnosis not present

## 2022-07-13 DIAGNOSIS — F419 Anxiety disorder, unspecified: Secondary | ICD-10-CM | POA: Diagnosis not present

## 2022-07-13 DIAGNOSIS — S91209A Unspecified open wound of unspecified toe(s) with damage to nail, initial encounter: Secondary | ICD-10-CM | POA: Diagnosis not present

## 2022-07-13 DIAGNOSIS — J449 Chronic obstructive pulmonary disease, unspecified: Secondary | ICD-10-CM | POA: Diagnosis not present

## 2022-07-13 DIAGNOSIS — F319 Bipolar disorder, unspecified: Secondary | ICD-10-CM | POA: Diagnosis not present

## 2022-07-13 DIAGNOSIS — E0840 Diabetes mellitus due to underlying condition with diabetic neuropathy, unspecified: Secondary | ICD-10-CM | POA: Diagnosis not present

## 2022-07-13 DIAGNOSIS — M6281 Muscle weakness (generalized): Secondary | ICD-10-CM | POA: Diagnosis not present

## 2022-07-13 DIAGNOSIS — E118 Type 2 diabetes mellitus with unspecified complications: Secondary | ICD-10-CM | POA: Diagnosis not present

## 2022-07-14 DIAGNOSIS — E119 Type 2 diabetes mellitus without complications: Secondary | ICD-10-CM | POA: Diagnosis not present

## 2022-07-14 DIAGNOSIS — E0840 Diabetes mellitus due to underlying condition with diabetic neuropathy, unspecified: Secondary | ICD-10-CM | POA: Diagnosis not present

## 2022-07-14 DIAGNOSIS — H2513 Age-related nuclear cataract, bilateral: Secondary | ICD-10-CM | POA: Diagnosis not present

## 2022-07-14 DIAGNOSIS — M6281 Muscle weakness (generalized): Secondary | ICD-10-CM | POA: Diagnosis not present

## 2022-07-14 DIAGNOSIS — H524 Presbyopia: Secondary | ICD-10-CM | POA: Diagnosis not present

## 2022-07-14 DIAGNOSIS — I5040 Unspecified combined systolic (congestive) and diastolic (congestive) heart failure: Secondary | ICD-10-CM | POA: Diagnosis not present

## 2022-07-17 DIAGNOSIS — M6281 Muscle weakness (generalized): Secondary | ICD-10-CM | POA: Diagnosis not present

## 2022-07-17 DIAGNOSIS — I5040 Unspecified combined systolic (congestive) and diastolic (congestive) heart failure: Secondary | ICD-10-CM | POA: Diagnosis not present

## 2022-07-17 DIAGNOSIS — E0840 Diabetes mellitus due to underlying condition with diabetic neuropathy, unspecified: Secondary | ICD-10-CM | POA: Diagnosis not present

## 2022-07-18 DIAGNOSIS — I10 Essential (primary) hypertension: Secondary | ICD-10-CM | POA: Diagnosis not present

## 2022-07-19 DIAGNOSIS — M6281 Muscle weakness (generalized): Secondary | ICD-10-CM | POA: Diagnosis not present

## 2022-07-19 DIAGNOSIS — E0840 Diabetes mellitus due to underlying condition with diabetic neuropathy, unspecified: Secondary | ICD-10-CM | POA: Diagnosis not present

## 2022-07-19 DIAGNOSIS — I5040 Unspecified combined systolic (congestive) and diastolic (congestive) heart failure: Secondary | ICD-10-CM | POA: Diagnosis not present

## 2022-07-20 DIAGNOSIS — M6281 Muscle weakness (generalized): Secondary | ICD-10-CM | POA: Diagnosis not present

## 2022-07-20 DIAGNOSIS — I5040 Unspecified combined systolic (congestive) and diastolic (congestive) heart failure: Secondary | ICD-10-CM | POA: Diagnosis not present

## 2022-07-20 DIAGNOSIS — E0840 Diabetes mellitus due to underlying condition with diabetic neuropathy, unspecified: Secondary | ICD-10-CM | POA: Diagnosis not present

## 2022-07-22 DIAGNOSIS — M6281 Muscle weakness (generalized): Secondary | ICD-10-CM | POA: Diagnosis not present

## 2022-07-22 DIAGNOSIS — I5040 Unspecified combined systolic (congestive) and diastolic (congestive) heart failure: Secondary | ICD-10-CM | POA: Diagnosis not present

## 2022-07-22 DIAGNOSIS — E0840 Diabetes mellitus due to underlying condition with diabetic neuropathy, unspecified: Secondary | ICD-10-CM | POA: Diagnosis not present

## 2022-07-24 ENCOUNTER — Ambulatory Visit (HOSPITAL_BASED_OUTPATIENT_CLINIC_OR_DEPARTMENT_OTHER): Payer: Medicare Other | Attending: Internal Medicine | Admitting: Internal Medicine

## 2022-07-24 VITALS — Ht 61.0 in | Wt 300.0 lb

## 2022-07-24 DIAGNOSIS — G4700 Insomnia, unspecified: Secondary | ICD-10-CM | POA: Diagnosis not present

## 2022-07-24 DIAGNOSIS — G478 Other sleep disorders: Secondary | ICD-10-CM | POA: Diagnosis not present

## 2022-07-24 DIAGNOSIS — I110 Hypertensive heart disease with heart failure: Secondary | ICD-10-CM | POA: Diagnosis not present

## 2022-07-24 DIAGNOSIS — I509 Heart failure, unspecified: Secondary | ICD-10-CM | POA: Diagnosis not present

## 2022-07-24 DIAGNOSIS — E119 Type 2 diabetes mellitus without complications: Secondary | ICD-10-CM | POA: Diagnosis not present

## 2022-07-24 DIAGNOSIS — R0683 Snoring: Secondary | ICD-10-CM | POA: Insufficient documentation

## 2022-07-24 DIAGNOSIS — G4733 Obstructive sleep apnea (adult) (pediatric): Secondary | ICD-10-CM

## 2022-07-28 DIAGNOSIS — E119 Type 2 diabetes mellitus without complications: Secondary | ICD-10-CM | POA: Diagnosis not present

## 2022-07-28 DIAGNOSIS — I509 Heart failure, unspecified: Secondary | ICD-10-CM | POA: Diagnosis not present

## 2022-07-28 DIAGNOSIS — E785 Hyperlipidemia, unspecified: Secondary | ICD-10-CM | POA: Diagnosis not present

## 2022-07-28 DIAGNOSIS — I10 Essential (primary) hypertension: Secondary | ICD-10-CM | POA: Diagnosis not present

## 2022-07-30 DIAGNOSIS — M47816 Spondylosis without myelopathy or radiculopathy, lumbar region: Secondary | ICD-10-CM | POA: Diagnosis not present

## 2022-07-30 DIAGNOSIS — L97129 Non-pressure chronic ulcer of left thigh with unspecified severity: Secondary | ICD-10-CM | POA: Diagnosis not present

## 2022-07-30 DIAGNOSIS — J449 Chronic obstructive pulmonary disease, unspecified: Secondary | ICD-10-CM | POA: Diagnosis not present

## 2022-07-30 DIAGNOSIS — E119 Type 2 diabetes mellitus without complications: Secondary | ICD-10-CM | POA: Diagnosis not present

## 2022-08-01 DIAGNOSIS — G4733 Obstructive sleep apnea (adult) (pediatric): Secondary | ICD-10-CM

## 2022-08-01 NOTE — Procedures (Signed)
Patient Name: Faith Flores, [place] Date: 07/24/2022 Gender: Female D.O.B: [birth year] Age (years): 39 Referring Provider: S Ahmend Tejan-Sie Height (inches): 61 Interpreting Physician: Jetty Duhamel MD, ABSM Weight (lbs): 300 RPSGT: Lowry Ram BMI: 57 MRN: [id number] Neck Size: 17.00  CLINICAL INFORMATION Sleep Study Type: NPSG Indication for sleep study: Congestive Heart Failure, Depression, Diabetes, Hypertension, Insomnia, Morbid Obesity, Re-Evaluation, Snoring Epworth Sleepiness Score: 2  SLEEP STUDY TECHNIQUE As per the AASM Manual for the Scoring of Sleep and Associated Events v2.3 (April 2016) with a hypopnea requiring 4% desaturations.  The channels recorded and monitored were frontal, central and occipital EEG, electrooculogram (EOG), submentalis EMG (chin), nasal and oral airflow, thoracic and abdominal wall motion, anterior tibialis EMG, snore microphone, electrocardiogram, and pulse oximetry.  MEDICATIONS Medications self-administered by patient taken the night of the study : none reported  SLEEP ARCHITECTURE The study was initiated at 10:05:10 PM and ended at 4:27:59 AM.  Sleep onset time was 31.0 minutes and the sleep efficiency was 65.8%. The total sleep time was 251.8 minutes.  Stage REM latency was N/A minutes.  The patient spent 1.0% of the night in stage N1 sleep, 99.0% in stage N2 sleep, 0.0% in stage N3 and 0% in REM.  Alpha intrusion was absent.  Supine sleep was 100.00%.  RESPIRATORY PARAMETERS The overall apnea/hypopnea index (AHI) was 0.2 per hour. There were 1 total apneas, including 1 obstructive, 0 central and 0 mixed apneas. There were 0 hypopneas and 6 RERAs.  The AHI during Stage REM sleep was N/A per hour.  AHI while supine was 0.2 per hour.  The mean oxygen saturation was 93.9%. The minimum SpO2 during sleep was 89.0%.  moderate snoring was noted during this study.  CARDIAC DATA The 2 lead EKG demonstrated sinus rhythm.  The mean heart rate was 62.3 beats per minute. Other EKG findings include: PVCs.  LEG MOVEMENT DATA The total PLMS were 0 with a resulting PLMS index of 0.0. Associated arousal with leg movement index was 0.2 .  IMPRESSIONS - No significant obstructive sleep apnea occurred during this study (AHI = 0.2/h). -  Patient arrived on O2 2L and per protocol, was studied on O2 2L. The patient had minimal or no oxygen desaturation during the study (Min O2 = 89.0%, Mean 93.9%).  - The patient snored with moderate snoring volume. - EKG findings include PVCs. - Clinically significant periodic limb movements did not occur during sleep. No significant associated arousals. - The patient woke spontaneously from N2 sleep at 12:48 AM having a conversation about a stuffed animal she needed to get up and get, then again at 03:01, confused about why she had monitoring leads on. Sustained difficulty regaining sleep after this awakening.  DIAGNOSIS - Nocturnal Hypoxemia (G47.36) - Other Sleep Disorder  RECOMMENDATIONS - Be careful with alcohol, sedatives and other CNS depressants that may worsen sleep apnea and disrupt normal sleep architecture. - Sleep hygiene should be reviewed to assess factors that may improve sleep quality. - Weight management and regular exercise should be initiated or continued if appropriate.  [Electronically signed] 08/01/2022 11:35 AM  Jetty Duhamel MD, ABSM Diplomate, American Board of Sleep Medicine NPI: [id number]                         Jetty Duhamel Diplomate, American Board of Sleep Medicine  ELECTRONICALLY SIGNED ON:  08/01/2022, 11:26 AM [facility] PH: [phone]   FX: [phone] ACCREDITED BY THE  AMERICAN ACADEMY OF SLEEP MEDICINE

## 2022-08-04 DIAGNOSIS — E559 Vitamin D deficiency, unspecified: Secondary | ICD-10-CM | POA: Diagnosis not present

## 2022-08-04 DIAGNOSIS — I10 Essential (primary) hypertension: Secondary | ICD-10-CM | POA: Diagnosis not present

## 2022-08-04 DIAGNOSIS — B179 Acute viral hepatitis, unspecified: Secondary | ICD-10-CM | POA: Diagnosis not present

## 2022-08-05 DIAGNOSIS — F331 Major depressive disorder, recurrent, moderate: Secondary | ICD-10-CM | POA: Diagnosis not present

## 2022-08-05 DIAGNOSIS — F411 Generalized anxiety disorder: Secondary | ICD-10-CM | POA: Diagnosis not present

## 2022-08-07 DIAGNOSIS — E785 Hyperlipidemia, unspecified: Secondary | ICD-10-CM | POA: Diagnosis not present

## 2022-08-07 DIAGNOSIS — I10 Essential (primary) hypertension: Secondary | ICD-10-CM | POA: Diagnosis not present

## 2022-08-07 DIAGNOSIS — I480 Paroxysmal atrial fibrillation: Secondary | ICD-10-CM | POA: Diagnosis not present

## 2022-08-07 DIAGNOSIS — E119 Type 2 diabetes mellitus without complications: Secondary | ICD-10-CM | POA: Diagnosis not present

## 2022-08-19 DIAGNOSIS — F331 Major depressive disorder, recurrent, moderate: Secondary | ICD-10-CM | POA: Diagnosis not present

## 2022-08-19 DIAGNOSIS — F411 Generalized anxiety disorder: Secondary | ICD-10-CM | POA: Diagnosis not present

## 2022-08-24 DIAGNOSIS — I509 Heart failure, unspecified: Secondary | ICD-10-CM | POA: Diagnosis not present

## 2022-08-24 DIAGNOSIS — I10 Essential (primary) hypertension: Secondary | ICD-10-CM | POA: Diagnosis not present

## 2022-08-24 DIAGNOSIS — E785 Hyperlipidemia, unspecified: Secondary | ICD-10-CM | POA: Diagnosis not present

## 2022-08-24 DIAGNOSIS — E119 Type 2 diabetes mellitus without complications: Secondary | ICD-10-CM | POA: Diagnosis not present

## 2022-09-02 DIAGNOSIS — E0840 Diabetes mellitus due to underlying condition with diabetic neuropathy, unspecified: Secondary | ICD-10-CM | POA: Diagnosis not present

## 2022-09-02 DIAGNOSIS — G629 Polyneuropathy, unspecified: Secondary | ICD-10-CM | POA: Diagnosis not present

## 2022-09-02 DIAGNOSIS — I5040 Unspecified combined systolic (congestive) and diastolic (congestive) heart failure: Secondary | ICD-10-CM | POA: Diagnosis not present

## 2022-09-02 DIAGNOSIS — F411 Generalized anxiety disorder: Secondary | ICD-10-CM | POA: Diagnosis not present

## 2022-09-02 DIAGNOSIS — F331 Major depressive disorder, recurrent, moderate: Secondary | ICD-10-CM | POA: Diagnosis not present

## 2022-09-02 DIAGNOSIS — Z741 Need for assistance with personal care: Secondary | ICD-10-CM | POA: Diagnosis not present

## 2022-09-02 DIAGNOSIS — I480 Paroxysmal atrial fibrillation: Secondary | ICD-10-CM | POA: Diagnosis not present

## 2022-09-02 DIAGNOSIS — M6281 Muscle weakness (generalized): Secondary | ICD-10-CM | POA: Diagnosis not present

## 2022-09-02 DIAGNOSIS — R41841 Cognitive communication deficit: Secondary | ICD-10-CM | POA: Diagnosis not present

## 2022-09-03 DIAGNOSIS — I5040 Unspecified combined systolic (congestive) and diastolic (congestive) heart failure: Secondary | ICD-10-CM | POA: Diagnosis not present

## 2022-09-03 DIAGNOSIS — I480 Paroxysmal atrial fibrillation: Secondary | ICD-10-CM | POA: Diagnosis not present

## 2022-09-03 DIAGNOSIS — E785 Hyperlipidemia, unspecified: Secondary | ICD-10-CM | POA: Diagnosis not present

## 2022-09-03 DIAGNOSIS — R41841 Cognitive communication deficit: Secondary | ICD-10-CM | POA: Diagnosis not present

## 2022-09-03 DIAGNOSIS — M6281 Muscle weakness (generalized): Secondary | ICD-10-CM | POA: Diagnosis not present

## 2022-09-03 DIAGNOSIS — E119 Type 2 diabetes mellitus without complications: Secondary | ICD-10-CM | POA: Diagnosis not present

## 2022-09-03 DIAGNOSIS — I10 Essential (primary) hypertension: Secondary | ICD-10-CM | POA: Diagnosis not present

## 2022-09-03 DIAGNOSIS — E0840 Diabetes mellitus due to underlying condition with diabetic neuropathy, unspecified: Secondary | ICD-10-CM | POA: Diagnosis not present

## 2022-09-04 DIAGNOSIS — F329 Major depressive disorder, single episode, unspecified: Secondary | ICD-10-CM | POA: Diagnosis not present

## 2022-09-04 DIAGNOSIS — E119 Type 2 diabetes mellitus without complications: Secondary | ICD-10-CM | POA: Diagnosis not present

## 2022-09-04 DIAGNOSIS — E785 Hyperlipidemia, unspecified: Secondary | ICD-10-CM | POA: Diagnosis not present

## 2022-09-04 DIAGNOSIS — J449 Chronic obstructive pulmonary disease, unspecified: Secondary | ICD-10-CM | POA: Diagnosis not present

## 2022-09-08 DIAGNOSIS — I5040 Unspecified combined systolic (congestive) and diastolic (congestive) heart failure: Secondary | ICD-10-CM | POA: Diagnosis not present

## 2022-09-08 DIAGNOSIS — E0840 Diabetes mellitus due to underlying condition with diabetic neuropathy, unspecified: Secondary | ICD-10-CM | POA: Diagnosis not present

## 2022-09-08 DIAGNOSIS — M6281 Muscle weakness (generalized): Secondary | ICD-10-CM | POA: Diagnosis not present

## 2022-09-08 DIAGNOSIS — L98411 Non-pressure chronic ulcer of buttock limited to breakdown of skin: Secondary | ICD-10-CM | POA: Diagnosis not present

## 2022-09-09 DIAGNOSIS — I480 Paroxysmal atrial fibrillation: Secondary | ICD-10-CM | POA: Diagnosis not present

## 2022-09-09 DIAGNOSIS — G629 Polyneuropathy, unspecified: Secondary | ICD-10-CM | POA: Diagnosis not present

## 2022-09-09 DIAGNOSIS — Z741 Need for assistance with personal care: Secondary | ICD-10-CM | POA: Diagnosis not present

## 2022-09-09 DIAGNOSIS — I5040 Unspecified combined systolic (congestive) and diastolic (congestive) heart failure: Secondary | ICD-10-CM | POA: Diagnosis not present

## 2022-09-09 DIAGNOSIS — M6281 Muscle weakness (generalized): Secondary | ICD-10-CM | POA: Diagnosis not present

## 2022-09-09 DIAGNOSIS — E0840 Diabetes mellitus due to underlying condition with diabetic neuropathy, unspecified: Secondary | ICD-10-CM | POA: Diagnosis not present

## 2022-09-09 DIAGNOSIS — F411 Generalized anxiety disorder: Secondary | ICD-10-CM | POA: Diagnosis not present

## 2022-09-09 DIAGNOSIS — F331 Major depressive disorder, recurrent, moderate: Secondary | ICD-10-CM | POA: Diagnosis not present

## 2022-09-10 ENCOUNTER — Other Ambulatory Visit: Payer: Self-pay

## 2022-09-10 ENCOUNTER — Emergency Department (HOSPITAL_COMMUNITY): Payer: Medicare Other

## 2022-09-10 ENCOUNTER — Encounter (HOSPITAL_COMMUNITY): Payer: Self-pay

## 2022-09-10 ENCOUNTER — Inpatient Hospital Stay (HOSPITAL_COMMUNITY)
Admission: EM | Admit: 2022-09-10 | Discharge: 2022-09-15 | DRG: 389 | Disposition: A | Discharge status: Skilled Nursing Facility | Payer: Medicare Other | Attending: Student | Admitting: Student

## 2022-09-10 DIAGNOSIS — Z90411 Acquired partial absence of pancreas: Secondary | ICD-10-CM

## 2022-09-10 DIAGNOSIS — E662 Morbid (severe) obesity with alveolar hypoventilation: Secondary | ICD-10-CM | POA: Diagnosis present

## 2022-09-10 DIAGNOSIS — M6281 Muscle weakness (generalized): Secondary | ICD-10-CM | POA: Diagnosis not present

## 2022-09-10 DIAGNOSIS — K838 Other specified diseases of biliary tract: Secondary | ICD-10-CM | POA: Diagnosis present

## 2022-09-10 DIAGNOSIS — G894 Chronic pain syndrome: Secondary | ICD-10-CM | POA: Diagnosis present

## 2022-09-10 DIAGNOSIS — E161 Other hypoglycemia: Secondary | ICD-10-CM | POA: Diagnosis not present

## 2022-09-10 DIAGNOSIS — E1142 Type 2 diabetes mellitus with diabetic polyneuropathy: Secondary | ICD-10-CM | POA: Diagnosis present

## 2022-09-10 DIAGNOSIS — I959 Hypotension, unspecified: Secondary | ICD-10-CM | POA: Diagnosis not present

## 2022-09-10 DIAGNOSIS — M797 Fibromyalgia: Secondary | ICD-10-CM | POA: Diagnosis present

## 2022-09-10 DIAGNOSIS — L89892 Pressure ulcer of other site, stage 2: Secondary | ICD-10-CM | POA: Diagnosis not present

## 2022-09-10 DIAGNOSIS — Z79899 Other long term (current) drug therapy: Secondary | ICD-10-CM | POA: Diagnosis not present

## 2022-09-10 DIAGNOSIS — R001 Bradycardia, unspecified: Secondary | ICD-10-CM | POA: Diagnosis not present

## 2022-09-10 DIAGNOSIS — K5904 Chronic idiopathic constipation: Secondary | ICD-10-CM | POA: Diagnosis not present

## 2022-09-10 DIAGNOSIS — L03031 Cellulitis of right toe: Secondary | ICD-10-CM | POA: Diagnosis not present

## 2022-09-10 DIAGNOSIS — Z6841 Body Mass Index (BMI) 40.0 and over, adult: Secondary | ICD-10-CM | POA: Diagnosis not present

## 2022-09-10 DIAGNOSIS — Z66 Do not resuscitate: Secondary | ICD-10-CM | POA: Diagnosis not present

## 2022-09-10 DIAGNOSIS — Z7401 Bed confinement status: Secondary | ICD-10-CM | POA: Diagnosis not present

## 2022-09-10 DIAGNOSIS — K6389 Other specified diseases of intestine: Secondary | ICD-10-CM | POA: Diagnosis not present

## 2022-09-10 DIAGNOSIS — F3160 Bipolar disorder, current episode mixed, unspecified: Secondary | ICD-10-CM | POA: Diagnosis present

## 2022-09-10 DIAGNOSIS — E1122 Type 2 diabetes mellitus with diabetic chronic kidney disease: Secondary | ICD-10-CM | POA: Diagnosis present

## 2022-09-10 DIAGNOSIS — F329 Major depressive disorder, single episode, unspecified: Secondary | ICD-10-CM | POA: Diagnosis not present

## 2022-09-10 DIAGNOSIS — Z90710 Acquired absence of both cervix and uterus: Secondary | ICD-10-CM

## 2022-09-10 DIAGNOSIS — E1165 Type 2 diabetes mellitus with hyperglycemia: Secondary | ICD-10-CM | POA: Diagnosis not present

## 2022-09-10 DIAGNOSIS — R2689 Other abnormalities of gait and mobility: Secondary | ICD-10-CM | POA: Diagnosis not present

## 2022-09-10 DIAGNOSIS — L8992 Pressure ulcer of unspecified site, stage 2: Secondary | ICD-10-CM | POA: Diagnosis not present

## 2022-09-10 DIAGNOSIS — Z888 Allergy status to other drugs, medicaments and biological substances status: Secondary | ICD-10-CM

## 2022-09-10 DIAGNOSIS — N179 Acute kidney failure, unspecified: Secondary | ICD-10-CM | POA: Diagnosis present

## 2022-09-10 DIAGNOSIS — F4310 Post-traumatic stress disorder, unspecified: Secondary | ICD-10-CM | POA: Diagnosis present

## 2022-09-10 DIAGNOSIS — J4489 Other specified chronic obstructive pulmonary disease: Secondary | ICD-10-CM | POA: Diagnosis present

## 2022-09-10 DIAGNOSIS — K567 Ileus, unspecified: Principal | ICD-10-CM | POA: Diagnosis present

## 2022-09-10 DIAGNOSIS — N1831 Chronic kidney disease, stage 3a: Secondary | ICD-10-CM | POA: Diagnosis not present

## 2022-09-10 DIAGNOSIS — Z79891 Long term (current) use of opiate analgesic: Secondary | ICD-10-CM

## 2022-09-10 DIAGNOSIS — I130 Hypertensive heart and chronic kidney disease with heart failure and stage 1 through stage 4 chronic kidney disease, or unspecified chronic kidney disease: Secondary | ICD-10-CM | POA: Diagnosis not present

## 2022-09-10 DIAGNOSIS — F419 Anxiety disorder, unspecified: Secondary | ICD-10-CM | POA: Diagnosis not present

## 2022-09-10 DIAGNOSIS — Z885 Allergy status to narcotic agent status: Secondary | ICD-10-CM

## 2022-09-10 DIAGNOSIS — Z794 Long term (current) use of insulin: Secondary | ICD-10-CM

## 2022-09-10 DIAGNOSIS — K439 Ventral hernia without obstruction or gangrene: Secondary | ICD-10-CM | POA: Diagnosis not present

## 2022-09-10 DIAGNOSIS — I5042 Chronic combined systolic (congestive) and diastolic (congestive) heart failure: Secondary | ICD-10-CM | POA: Diagnosis not present

## 2022-09-10 DIAGNOSIS — K560 Paralytic ileus: Secondary | ICD-10-CM | POA: Diagnosis not present

## 2022-09-10 DIAGNOSIS — Z8249 Family history of ischemic heart disease and other diseases of the circulatory system: Secondary | ICD-10-CM

## 2022-09-10 DIAGNOSIS — E11649 Type 2 diabetes mellitus with hypoglycemia without coma: Secondary | ICD-10-CM | POA: Diagnosis not present

## 2022-09-10 DIAGNOSIS — K56609 Unspecified intestinal obstruction, unspecified as to partial versus complete obstruction: Secondary | ICD-10-CM | POA: Diagnosis present

## 2022-09-10 DIAGNOSIS — L8990 Pressure ulcer of unspecified site, unspecified stage: Secondary | ICD-10-CM | POA: Insufficient documentation

## 2022-09-10 DIAGNOSIS — Z881 Allergy status to other antibiotic agents status: Secondary | ICD-10-CM

## 2022-09-10 DIAGNOSIS — R748 Abnormal levels of other serum enzymes: Secondary | ICD-10-CM | POA: Diagnosis present

## 2022-09-10 DIAGNOSIS — E785 Hyperlipidemia, unspecified: Secondary | ICD-10-CM | POA: Diagnosis not present

## 2022-09-10 DIAGNOSIS — I10 Essential (primary) hypertension: Secondary | ICD-10-CM | POA: Diagnosis present

## 2022-09-10 DIAGNOSIS — R109 Unspecified abdominal pain: Secondary | ICD-10-CM | POA: Diagnosis not present

## 2022-09-10 DIAGNOSIS — Z7982 Long term (current) use of aspirin: Secondary | ICD-10-CM

## 2022-09-10 DIAGNOSIS — J449 Chronic obstructive pulmonary disease, unspecified: Secondary | ICD-10-CM | POA: Diagnosis not present

## 2022-09-10 DIAGNOSIS — I5040 Unspecified combined systolic (congestive) and diastolic (congestive) heart failure: Secondary | ICD-10-CM | POA: Diagnosis not present

## 2022-09-10 DIAGNOSIS — E0840 Diabetes mellitus due to underlying condition with diabetic neuropathy, unspecified: Secondary | ICD-10-CM | POA: Diagnosis not present

## 2022-09-10 DIAGNOSIS — K219 Gastro-esophageal reflux disease without esophagitis: Secondary | ICD-10-CM | POA: Diagnosis not present

## 2022-09-10 DIAGNOSIS — Z7984 Long term (current) use of oral hypoglycemic drugs: Secondary | ICD-10-CM

## 2022-09-10 DIAGNOSIS — E119 Type 2 diabetes mellitus without complications: Secondary | ICD-10-CM | POA: Diagnosis not present

## 2022-09-10 DIAGNOSIS — Z9049 Acquired absence of other specified parts of digestive tract: Secondary | ICD-10-CM

## 2022-09-10 DIAGNOSIS — Z87891 Personal history of nicotine dependence: Secondary | ICD-10-CM

## 2022-09-10 LAB — COMPREHENSIVE METABOLIC PANEL
ALT: 11 U/L (ref 0–44)
AST: 22 U/L (ref 15–41)
Albumin: 3.6 g/dL (ref 3.5–5.0)
Alkaline Phosphatase: 145 U/L — ABNORMAL HIGH (ref 38–126)
Anion gap: 12 (ref 5–15)
BUN: 20 mg/dL (ref 8–23)
CO2: 25 mmol/L (ref 22–32)
Calcium: 8.8 mg/dL — ABNORMAL LOW (ref 8.9–10.3)
Chloride: 98 mmol/L (ref 98–111)
Creatinine, Ser: 2.13 mg/dL — ABNORMAL HIGH (ref 0.44–1.00)
GFR, Estimated: 25 mL/min — ABNORMAL LOW (ref >60)
Glucose, Bld: 44 mg/dL — CL (ref 70–99)
Potassium: 4.4 mmol/L (ref 3.5–5.1)
Sodium: 135 mmol/L (ref 135–145)
Total Bilirubin: 0.9 mg/dL (ref 0.3–1.2)
Total Protein: 7.6 g/dL (ref 6.5–8.1)

## 2022-09-10 LAB — I-STAT CG4 LACTIC ACID, ED: Lactic Acid, Venous: 1.8 mmol/L (ref 0.5–1.9)

## 2022-09-10 LAB — CBC
HCT: 38.3 % (ref 36.0–46.0)
Hemoglobin: 11.9 g/dL — ABNORMAL LOW (ref 12.0–15.0)
MCH: 26 pg (ref 26.0–34.0)
MCHC: 31.1 g/dL (ref 30.0–36.0)
MCV: 83.8 fL (ref 80.0–100.0)
Platelets: 164 10*3/uL (ref 150–400)
RBC: 4.57 MIL/uL (ref 3.87–5.11)
RDW: 16.6 % — ABNORMAL HIGH (ref 11.5–15.5)
WBC: 8.2 10*3/uL (ref 4.0–10.5)
nRBC: 0 % (ref 0.0–0.2)

## 2022-09-10 LAB — CBG MONITORING, ED
Glucose-Capillary: 183 mg/dL — ABNORMAL HIGH (ref 70–99)
Glucose-Capillary: 45 mg/dL — ABNORMAL LOW (ref 70–99)
Glucose-Capillary: 62 mg/dL — ABNORMAL LOW (ref 70–99)

## 2022-09-10 LAB — LIPASE, BLOOD: Lipase: 19 U/L (ref 11–51)

## 2022-09-10 MED ORDER — ENOXAPARIN SODIUM 40 MG/0.4ML IJ SOSY
40.0000 mg | PREFILLED_SYRINGE | INTRAMUSCULAR | Status: DC
  Administered 2022-09-11 – 2022-09-15 (×5): 40 mg via SUBCUTANEOUS
  Filled 2022-09-10 (×5): qty 0.4

## 2022-09-10 MED ORDER — HYDROMORPHONE HCL 1 MG/ML IJ SOLN
1.0000 mg | Freq: Once | INTRAMUSCULAR | Status: AC
  Administered 2022-09-10: 1 mg via INTRAVENOUS
  Filled 2022-09-10: qty 1

## 2022-09-10 MED ORDER — HYDROMORPHONE HCL 1 MG/ML IJ SOLN
0.5000 mg | Freq: Once | INTRAMUSCULAR | Status: AC
Start: 2022-09-10 — End: 2022-09-10
  Administered 2022-09-10: 0.5 mg via INTRAVENOUS
  Filled 2022-09-10: qty 1

## 2022-09-10 MED ORDER — DEXTROSE 50 % IV SOLN
1.0000 | Freq: Once | INTRAVENOUS | Status: AC
  Administered 2022-09-10: 50 mL via INTRAVENOUS
  Filled 2022-09-10: qty 50

## 2022-09-10 MED ORDER — DEXTROSE IN LACTATED RINGERS 5 % IV SOLN: INTRAVENOUS | Status: DC

## 2022-09-10 NOTE — ED Triage Notes (Signed)
Pt presents via POV c/o left sided abd pain. Pt reports she has hx of hernia. Reports no BM for at least a week.
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
## 2022-09-10 NOTE — ED Notes (Signed)
Patient denies pain and is resting comfortably.  

## 2022-09-10 NOTE — ED Provider Notes (Signed)
[place] EMERGENCY DEPARTMENT AT [facility] Provider Note   CSN: [id number] Arrival date & time: 09/10/22  2010     History  Chief Complaint  Patient presents with   Abdominal Pain   Constipation    Faith Flores is a 67 y.o. female.  67 year old female history of pancreatectomy and multiple hernia repairs presenting emergency department for constipation no bowel movement x 1 week with worsening left-sided abdominal pain.  No fevers chills.  She has been nauseated had some vomiting earlier, none currently.   Abdominal Pain Associated symptoms: constipation   Constipation Associated symptoms: abdominal pain        Home Medications Prior to Admission medications   Medication Sig Start Date End Date Taking? Authorizing Provider  insulin glargine-yfgn (SEMGLEE) 100 UNIT/ML Pen Inject 100 Units into the skin daily. 09/02/22  Yes [provider]  ondansetron (ZOFRAN) 8 MG tablet Take 8 mg by mouth 3 (three) times daily. 07/31/22  Yes [provider]  tiZANidine (ZANAFLEX) 2 MG tablet Take 2 mg by mouth 3 (three) times daily. 09/03/22  Yes [provider]  acetaminophen (TYLENOL) 325 MG tablet Take 650 mg by mouth every 4 (four) hours as needed for mild pain.    [provider]  albuterol (VENTOLIN HFA) 108 (90 Base) MCG/ACT inhaler Inhale 2 puffs into the lungs every 6 (six) hours as needed for wheezing or shortness of breath.    [provider]  ALPRAZolam Prudy Feeler) 0.5 MG tablet Take 0.5 mg by mouth daily in the afternoon. 05/14/19   [provider]  ARIPiprazole (ABILIFY) 5 MG tablet Take 5 mg by mouth daily.    [provider]  ascorbic acid (VITAMIN C) 1000 MG tablet Take 1,000 mg by mouth daily.    [provider]  aspirin EC 81 MG tablet Take 81 mg by mouth daily. Swallow whole.    [provider]  atorvastatin (LIPITOR) 40 MG tablet Take 40 mg by mouth daily.    [provider]   carisoprodol (SOMA) 350 MG tablet 1 tablet as needed    [provider]  Cholecalciferol (VITAMIN D3) 50 MCG (2000 UT) TABS Take 2,000 Units by mouth daily.    [provider]  diltiazem (CARDIZEM) 30 MG tablet Take by mouth daily in the afternoon. 05/08/20   [provider]  fluticasone (FLONASE) 50 MCG/ACT nasal spray Place 1 spray into both nostrils as needed.    [provider]  gabapentin (NEURONTIN) 600 MG tablet Take 600 mg by mouth every 8 (eight) hours. 11/11/19   [provider]  Glucagon, rDNA, (GLUCAGON EMERGENCY) 1 MG KIT as needed. 04/27/20   [provider]  guaiFENesin (ROBITUSSIN) 100 MG/5ML liquid Take 200 mg by mouth every 6 (six) hours as needed for cough.    [provider]  insulin glargine (LANTUS) 100 UNIT/ML Solostar Pen Inject into the skin daily. 20U in the PM; 24U in theAM    [provider]  insulin regular (NOVOLIN R) 100 units/mL injection Inject 0-26 Units into the skin See admin instructions. Inject 0-26 units subcutaneously three times daily before meals. Inject as per sliding scale: BG 0-150 = 0 units BG 151-200 = 6 units BG 201-250 = 10 units BG 251-300 = 14 units BG 301-350 = 18 units BG 351-400 = 22 units BG 401+ = 26 units    [provider]  JARDIANCE 10 MG TABS tablet Take 10 mg by mouth daily. 11/30/19  [provider]  LINZESS 290 MCG CAPS capsule Take 290 mcg by mouth daily. 10/27/20   [provider]  loratadine (CLARITIN) 10 MG tablet Take 10 mg by mouth daily as needed for allergies.    [provider]  magnesium oxide (MAG-OX) 400 MG tablet Take 400 mg by mouth daily.    [provider]  melatonin 5 MG TABS Take 5 mg by mouth at bedtime.    [provider]  metoprolol (TOPROL-XL) 200 MG 24 hr tablet Take 200 mg by mouth daily.    [provider]  nystatin (MYCOSTATIN/NYSTOP) powder Apply 1 application topically  every 12 (twelve) hours as needed (redness). Apply under left breast and under lower abdominal fold    [provider]  ondansetron (ZOFRAN) 4 MG tablet Take 4 mg by mouth every 6 (six) hours as needed for nausea or vomiting. May crush or dissolve in 5mL of water if needed    [provider]  Potassium Chloride ER 20 MEQ TBCR Take 1 tablet by mouth 2 (two) times daily.    [provider]  potassium chloride SA (KLOR-CON) 20 MEQ tablet Take 20 mEq by mouth 2 (two) times daily.    [provider]  QUEtiapine (SEROQUEL) 200 MG tablet Take 200-600 mg by mouth See admin instructions. Take 1 tablet (200mg ) by mouth in the morning and 3 tablets (600mg ) by mouth in the evening.    [provider]  senna-docusate (SENOKOT-S) 8.6-50 MG tablet Take 1 tablet by mouth 2 (two) times daily.    [provider]  sertraline (ZOLOFT) 100 MG tablet Take 200 mg by mouth daily. 07/17/19   [provider]  spironolactone (ALDACTONE) 25 MG tablet Take 25 mg by mouth daily. 04/27/19   [provider]  sulfamethoxazole-trimethoprim (BACTRIM DS) 800-160 MG tablet Take 1 tablet by mouth 2 (two) times daily.    [provider]  torsemide (DEMADEX) 20 MG tablet Take 1 tablet (20 mg total) by mouth daily. 11/13/20   Lanier Prude, MD  traMADol (ULTRAM) 50 MG tablet Take 50 mg by mouth every 6 (six) hours as needed for moderate pain. 03/01/20   [provider]  zaleplon (SONATA) 10 MG capsule Take 20 mg by mouth at bedtime.    [provider]      Allergies    Ketamine, Pregabalin, Zolpidem, Doxycycline, Erythromycin, Metoclopramide, Miconazole, Morphine, Penicillins, Pseudoephedrine hcl, Tetracaine, Tetracycline, Tuberculin tests, Clotrimazole, Dopamine, Loratadine, and Mupirocin    Review of Systems   Review of Systems  Gastrointestinal:  Positive for abdominal pain and constipation.    Physical Exam Updated Vital Signs BP  129/62   Pulse 93   Temp 98.4 F (36.9 C) (Oral)   Resp 16   Ht 5\' 1"  (1.549 m)   Wt (!) 141.5 kg   SpO2 93%   BMI 58.95 kg/m  Physical Exam Vitals and nursing note reviewed.  Constitutional:      General: She is not in acute distress.    Appearance: She is obese. She is not toxic-appearing.  HENT:     Head: Normocephalic.  Cardiovascular:     Rate and Rhythm: Normal rate and regular rhythm.  Pulmonary:     Effort: Pulmonary effort is normal.     Breath sounds: Normal breath sounds.  Abdominal:     General: Abdomen is flat.     Palpations: Abdomen is soft.     Tenderness: There is abdominal tenderness in the periumbilical  area, left upper quadrant and left lower quadrant.     Hernia: A hernia is present.  Skin:    General: Skin is warm and dry.     Capillary Refill: Capillary refill takes less than 2 seconds.  Neurological:     Mental Status: She is alert and oriented to person, place, and time.  Psychiatric:        Mood and Affect: Mood normal.        Behavior: Behavior normal.     ED Results / Procedures / Treatments   Labs (all labs ordered are listed, but only abnormal results are displayed) Labs Reviewed  COMPREHENSIVE METABOLIC PANEL - Abnormal; Notable for the following components:      Result Value   Glucose, Bld 44 (*)    Creatinine, Ser 2.13 (*)    Calcium 8.8 (*)    Alkaline Phosphatase 145 (*)    GFR, Estimated 25 (*)    All other components within normal limits  CBC - Abnormal; Notable for the following components:   Hemoglobin 11.9 (*)    RDW 16.6 (*)    All other components within normal limits  CBG MONITORING, ED - Abnormal; Notable for the following components:   Glucose-Capillary 45 (*)    All other components within normal limits  CBG MONITORING, ED - Abnormal; Notable for the following components:   Glucose-Capillary 183 (*)    All other components within normal limits  CBG MONITORING, ED - Abnormal; Notable for the following components:    Glucose-Capillary 62 (*)    All other components within normal limits  LIPASE, BLOOD  URINALYSIS, ROUTINE W REFLEX MICROSCOPIC  COMPREHENSIVE METABOLIC PANEL  CBC  PHOSPHORUS  MAGNESIUM  HIV ANTIBODY (ROUTINE TESTING W REFLEX)  I-STAT CG4 LACTIC ACID, ED    EKG None  Radiology CT ABDOMEN PELVIS WO CONTRAST  Result Date: 09/10/2022 CLINICAL DATA:  Bowel obstruction suspected. Left side abdominal pain. EXAM: CT ABDOMEN AND PELVIS WITHOUT CONTRAST TECHNIQUE: Multidetector CT imaging of the abdomen and pelvis was performed following the standard protocol without IV contrast. RADIATION DOSE REDUCTION: This exam was performed according to the departmental dose-optimization program which includes automated exposure control, adjustment of the mA and/or kV according to patient size and/or use of iterative reconstruction technique. COMPARISON:  05/08/2020 FINDINGS: Lower chest: No acute abnormality Hepatobiliary: No focal liver abnormality is seen. Status post cholecystectomy. Common bile duct mildly dilated at 12 mm compared to 18 mm previously. This is likely related to post cholecystectomy state. Pancreas: No focal abnormality or ductal dilatation. Spleen: No focal abnormality.  Normal size. Adrenals/Urinary Tract: No adrenal abnormality. No focal renal abnormality. No stones or hydronephrosis. Foley catheter in place with bladder decompressed. Stomach/Bowel: Laxity of anterior abdominal wall. There appears to been prior ventral hernia repair, but there is a large wide-mouth ventral hernia repair repair containing much of the large and small bowel. There are a few mildly dilated loops of upper abdominal small bowel, likely jejunal loops with air-fluid levels. Mild gaseous distention of the colon as well. Favor ileus over obstruction. Moderate stool burden. Changes of gastric sleeve. Vascular/Lymphatic: No evidence of aneurysm or adenopathy. Aortic atherosclerosis. Reproductive: Prior hysterectomy.  No  adnexal masses. Other: No free fluid or free air. Musculoskeletal: No acute bony abnormality. IMPRESSION: Large wide-mouth left ventral hernia containing much of the large and small bowel. There is mild diffuse gaseous distention of the colon with a few mildly distended jejunal small bowel loops. Favor ileus. Prior gastric sleeve.  Mild common bile duct dilatation, likely related to post cholecystectomy state. This is decreased since prior study. Aortic atherosclerosis. Electronically Signed   By: Charlett Nose M.D.   On: 09/10/2022 22:32    Procedures Procedures    Medications Ordered in ED Medications  dextrose 5 % in lactated ringers infusion ( Intravenous New Bag/Given 09/10/22 2300)  enoxaparin (LOVENOX) injection 40 mg (has no administration in time range)  dextrose 50 % solution 50 mL (50 mLs Intravenous Given 09/10/22 2139)  HYDROmorphone (DILAUDID) injection 0.5 mg (0.5 mg Intravenous Given 09/10/22 2142)  HYDROmorphone (DILAUDID) injection 1 mg (1 mg Intravenous Given 09/10/22 2242)    ED Course/ Medical Decision Making/ A&P Clinical Course as of 09/10/22 2356  Thu Sep 10, 2022  2136 Glucose(!!): 44 D50 ordered  [TY]  2236 Comprehensive metabolic panel(!!) Creatinine 1.61-WRUE ago [TY]  2236 Lactic Acid, Venous: 1.8 Bowel ischemia less likely. [TY]  2236 Lipase: 19 Lipase not indicative of acute pancreatitis. [TY]  2237 CBC(!) No leukocytosis or fever to suggest systemic infection. [TY]  2237 Hemoglobin(!): 11.9 Borderline [TY]    Clinical Course User Index [TY] Coral Spikes, DO                                 Medical Decision Making 67 year old female present emergency department for left-sided abdominal pain x 1 week.  Extensive surgical history.  She is afebrile nontachycardic hemodynamically stable.  Physical exam with tender abdomen, but is soft.  Obvious hernia and prior surgical scars.  No overlying skin changes to suggest strangulation as the hernia is quite large.   CT scan concerning for ileus.  She has hypoglycemia likely secondary to taking her insulin and decreased p.o. intake.  Received D50 and started on D5 LR.  No significant elevated lactate to suggest ischemia.  Appears to have baseline kidney function.  Labs otherwise reassuring.  Case discussed with hospitalist and with surgery.  Medicine admission with surgery consult.  Amount and/or Complexity of Data Reviewed External Data Reviewed:     Details: She does take insulin for her diabetes which would explain her hypoglycemia. Labs: ordered. Decision-making details documented in ED Course. Radiology: ordered.  Risk Prescription drug management. Decision regarding hospitalization.          Final Clinical Impression(s) / ED Diagnoses Final diagnoses:  Ileus [facility])    Rx / DC Orders ED Discharge Orders     None         Coral Spikes, DO 09/10/22 2356

## 2022-09-10 NOTE — ED Notes (Signed)
Admitting Provider at bedside. 

## 2022-09-11 DIAGNOSIS — E1142 Type 2 diabetes mellitus with diabetic polyneuropathy: Secondary | ICD-10-CM | POA: Diagnosis not present

## 2022-09-11 DIAGNOSIS — L8990 Pressure ulcer of unspecified site, unspecified stage: Secondary | ICD-10-CM | POA: Insufficient documentation

## 2022-09-11 DIAGNOSIS — Z794 Long term (current) use of insulin: Secondary | ICD-10-CM

## 2022-09-11 DIAGNOSIS — I10 Essential (primary) hypertension: Secondary | ICD-10-CM | POA: Diagnosis not present

## 2022-09-11 DIAGNOSIS — K5904 Chronic idiopathic constipation: Secondary | ICD-10-CM | POA: Diagnosis not present

## 2022-09-11 DIAGNOSIS — E11649 Type 2 diabetes mellitus with hypoglycemia without coma: Secondary | ICD-10-CM

## 2022-09-11 DIAGNOSIS — K567 Ileus, unspecified: Secondary | ICD-10-CM | POA: Diagnosis not present

## 2022-09-11 LAB — URINALYSIS, ROUTINE W REFLEX MICROSCOPIC
Bilirubin Urine: NEGATIVE
Glucose, UA: >=500 mg/dL — AB
Ketones, ur: NEGATIVE mg/dL
Nitrite: NEGATIVE
Protein, ur: NEGATIVE mg/dL
Specific Gravity, Urine: 1.01 (ref 1.005–1.030)
pH: 5 (ref 5.0–8.0)

## 2022-09-11 LAB — COMPREHENSIVE METABOLIC PANEL
ALT: 9 U/L (ref 0–44)
AST: 14 U/L — ABNORMAL LOW (ref 15–41)
Albumin: 3.1 g/dL — ABNORMAL LOW (ref 3.5–5.0)
Alkaline Phosphatase: 128 U/L — ABNORMAL HIGH (ref 38–126)
Anion gap: 9 (ref 5–15)
BUN: 18 mg/dL (ref 8–23)
CO2: 29 mmol/L (ref 22–32)
Calcium: 8.4 mg/dL — ABNORMAL LOW (ref 8.9–10.3)
Chloride: 97 mmol/L — ABNORMAL LOW (ref 98–111)
Creatinine, Ser: 1.92 mg/dL — ABNORMAL HIGH (ref 0.44–1.00)
GFR, Estimated: 28 mL/min — ABNORMAL LOW (ref >60)
Glucose, Bld: 119 mg/dL — ABNORMAL HIGH (ref 70–99)
Potassium: 3.7 mmol/L (ref 3.5–5.1)
Sodium: 135 mmol/L (ref 135–145)
Total Bilirubin: 0.6 mg/dL (ref 0.3–1.2)
Total Protein: 6.5 g/dL (ref 6.5–8.1)

## 2022-09-11 LAB — HIV ANTIBODY (ROUTINE TESTING W REFLEX): HIV Screen 4th Generation wRfx: NONREACTIVE

## 2022-09-11 LAB — CBC
HCT: 33.5 % — ABNORMAL LOW (ref 36.0–46.0)
Hemoglobin: 10.5 g/dL — ABNORMAL LOW (ref 12.0–15.0)
MCH: 26.4 pg (ref 26.0–34.0)
MCHC: 31.3 g/dL (ref 30.0–36.0)
MCV: 84.4 fL (ref 80.0–100.0)
Platelets: 146 10*3/uL — ABNORMAL LOW (ref 150–400)
RBC: 3.97 MIL/uL (ref 3.87–5.11)
RDW: 16.5 % — ABNORMAL HIGH (ref 11.5–15.5)
WBC: 6.2 10*3/uL (ref 4.0–10.5)
nRBC: 0 % (ref 0.0–0.2)

## 2022-09-11 LAB — GLUCOSE, CAPILLARY
Glucose-Capillary: 118 mg/dL — ABNORMAL HIGH (ref 70–99)
Glucose-Capillary: 181 mg/dL — ABNORMAL HIGH (ref 70–99)
Glucose-Capillary: 204 mg/dL — ABNORMAL HIGH (ref 70–99)
Glucose-Capillary: 248 mg/dL — ABNORMAL HIGH (ref 70–99)
Glucose-Capillary: 60 mg/dL — ABNORMAL LOW (ref 70–99)

## 2022-09-11 LAB — PHOSPHORUS: Phosphorus: 4.5 mg/dL (ref 2.5–4.6)

## 2022-09-11 LAB — MAGNESIUM: Magnesium: 2.4 mg/dL (ref 1.7–2.4)

## 2022-09-11 LAB — HEMOGLOBIN A1C
Hgb A1c MFr Bld: 7.3 % — ABNORMAL HIGH (ref 4.8–5.6)
Mean Plasma Glucose: 162.81 mg/dL

## 2022-09-11 MED ORDER — MELATONIN 5 MG PO TABS: 5.0000 mg | ORAL_TABLET | Freq: Every evening | ORAL | Status: DC | PRN

## 2022-09-11 MED ORDER — DICLOFENAC SODIUM 1 % EX GEL: 2.0000 g | Freq: Four times a day (QID) | CUTANEOUS | Status: DC | PRN

## 2022-09-11 MED ORDER — DILTIAZEM HCL 30 MG PO TABS
30.0000 mg | ORAL_TABLET | Freq: Three times a day (TID) | ORAL | Status: DC
  Administered 2022-09-11 – 2022-09-13 (×7): 30 mg via ORAL
  Filled 2022-09-11 (×7): qty 1

## 2022-09-11 MED ORDER — CHLORHEXIDINE GLUCONATE CLOTH 2 % EX PADS
6.0000 | MEDICATED_PAD | Freq: Every day | CUTANEOUS | Status: DC
  Administered 2022-09-11 – 2022-09-15 (×5): 6 via TOPICAL

## 2022-09-11 MED ORDER — BISACODYL 10 MG RE SUPP: 10.0000 mg | Freq: Every day | RECTAL | Status: DC | PRN

## 2022-09-11 MED ORDER — DEXTROSE 50 % IV SOLN: 50.0000 mL | INTRAVENOUS | Status: DC | PRN

## 2022-09-11 MED ORDER — ARIPIPRAZOLE 5 MG PO TABS
5.0000 mg | ORAL_TABLET | Freq: Every day | ORAL | Status: DC
  Administered 2022-09-11 – 2022-09-15 (×5): 5 mg via ORAL
  Filled 2022-09-11 (×5): qty 1

## 2022-09-11 MED ORDER — EMPAGLIFLOZIN 25 MG PO TABS: 25.0000 mg | ORAL_TABLET | Freq: Every morning | ORAL | Status: DC

## 2022-09-11 MED ORDER — SULFAMETHOXAZOLE-TRIMETHOPRIM 800-160 MG PO TABS
1.0000 | ORAL_TABLET | Freq: Two times a day (BID) | ORAL | Status: DC
  Administered 2022-09-11 – 2022-09-12 (×3): 1 via ORAL
  Filled 2022-09-11 (×3): qty 1

## 2022-09-11 MED ORDER — VITAMIN D 25 MCG (1000 UNIT) PO TABS
2000.0000 [IU] | ORAL_TABLET | Freq: Every day | ORAL | Status: DC
  Administered 2022-09-11: 2000 [IU] via ORAL
  Filled 2022-09-11: qty 2

## 2022-09-11 MED ORDER — BISACODYL 10 MG RE SUPP
10.0000 mg | Freq: Once | RECTAL | Status: AC
  Administered 2022-09-11: 10 mg via RECTAL
  Filled 2022-09-11: qty 1

## 2022-09-11 MED ORDER — POLYETHYLENE GLYCOL 3350 17 G PO PACK: 17.0000 g | PACK | Freq: Every day | ORAL | Status: DC | PRN

## 2022-09-11 MED ORDER — TRAMADOL HCL 50 MG PO TABS
50.0000 mg | ORAL_TABLET | Freq: Four times a day (QID) | ORAL | Status: DC | PRN
  Administered 2022-09-11 – 2022-09-15 (×10): 50 mg via ORAL
  Filled 2022-09-11 (×10): qty 1

## 2022-09-11 MED ORDER — DEXTROSE IN LACTATED RINGERS 5 % IV SOLN: INTRAVENOUS | Status: DC

## 2022-09-11 MED ORDER — SPIRONOLACTONE 25 MG PO TABS
25.0000 mg | ORAL_TABLET | Freq: Every day | ORAL | Status: DC
  Administered 2022-09-11 – 2022-09-13 (×3): 25 mg via ORAL
  Filled 2022-09-11 (×3): qty 1

## 2022-09-11 MED ORDER — QUETIAPINE FUMARATE 200 MG PO TABS
500.0000 mg | ORAL_TABLET | Freq: Every day | ORAL | Status: DC
  Administered 2022-09-11 – 2022-09-14 (×4): 500 mg via ORAL
  Filled 2022-09-11 (×4): qty 1

## 2022-09-11 MED ORDER — HYDROMORPHONE HCL 1 MG/ML IJ SOLN
0.5000 mg | Freq: Once | INTRAMUSCULAR | Status: AC
  Administered 2022-09-11: 0.5 mg via INTRAVENOUS
  Filled 2022-09-11: qty 0.5

## 2022-09-11 MED ORDER — SERTRALINE HCL 100 MG PO TABS
200.0000 mg | ORAL_TABLET | Freq: Every day | ORAL | Status: DC
  Administered 2022-09-11 – 2022-09-15 (×5): 200 mg via ORAL
  Filled 2022-09-11 (×5): qty 2

## 2022-09-11 MED ORDER — SENNA 8.6 MG PO TABS
2.0000 | ORAL_TABLET | Freq: Every day | ORAL | Status: DC
  Administered 2022-09-11 – 2022-09-15 (×5): 17.2 mg via ORAL
  Filled 2022-09-11 (×5): qty 2

## 2022-09-11 MED ORDER — MAGNESIUM OXIDE -MG SUPPLEMENT 400 (240 MG) MG PO TABS
400.0000 mg | ORAL_TABLET | Freq: Every day | ORAL | Status: DC
  Administered 2022-09-11 – 2022-09-15 (×5): 400 mg via ORAL
  Filled 2022-09-11 (×6): qty 1

## 2022-09-11 MED ORDER — POLYETHYLENE GLYCOL 3350 17 GM/SCOOP PO POWD
1.0000 | Freq: Once | ORAL | Status: AC
  Administered 2022-09-11: 255 g via ORAL
  Filled 2022-09-11: qty 255

## 2022-09-11 MED ORDER — ASPIRIN 81 MG PO TBEC
81.0000 mg | DELAYED_RELEASE_TABLET | Freq: Every day | ORAL | Status: DC
  Administered 2022-09-11 – 2022-09-15 (×5): 81 mg via ORAL
  Filled 2022-09-11 (×5): qty 1

## 2022-09-11 MED ORDER — IPRATROPIUM-ALBUTEROL 0.5-2.5 (3) MG/3ML IN SOLN
3.0000 mL | RESPIRATORY_TRACT | Status: DC | PRN
  Administered 2022-09-14: 3 mL via RESPIRATORY_TRACT
  Filled 2022-09-11: qty 3

## 2022-09-11 MED ORDER — HYDROMORPHONE HCL 1 MG/ML IJ SOLN
0.5000 mg | INTRAMUSCULAR | Status: AC | PRN
  Administered 2022-09-11 (×2): 0.5 mg via INTRAVENOUS
  Filled 2022-09-11 (×2): qty 0.5

## 2022-09-11 MED ORDER — MELATONIN 5 MG PO TABS
5.0000 mg | ORAL_TABLET | Freq: Every day | ORAL | Status: DC
  Administered 2022-09-11 – 2022-09-14 (×4): 5 mg via ORAL
  Filled 2022-09-11 (×4): qty 1

## 2022-09-11 MED ORDER — TIZANIDINE HCL 4 MG PO TABS
2.0000 mg | ORAL_TABLET | Freq: Three times a day (TID) | ORAL | Status: DC
  Administered 2022-09-11 – 2022-09-15 (×12): 2 mg via ORAL
  Filled 2022-09-11 (×12): qty 1

## 2022-09-11 MED ORDER — PROCHLORPERAZINE EDISYLATE 10 MG/2ML IJ SOLN
5.0000 mg | Freq: Four times a day (QID) | INTRAMUSCULAR | Status: DC | PRN
  Administered 2022-09-11: 5 mg via INTRAVENOUS
  Filled 2022-09-11: qty 2

## 2022-09-11 MED ORDER — ATORVASTATIN CALCIUM 40 MG PO TABS
40.0000 mg | ORAL_TABLET | Freq: Every day | ORAL | Status: DC
  Administered 2022-09-11 – 2022-09-15 (×5): 40 mg via ORAL
  Filled 2022-09-11 (×5): qty 1

## 2022-09-11 MED ORDER — ALPRAZOLAM 0.25 MG PO TABS
0.2500 mg | ORAL_TABLET | Freq: Every evening | ORAL | Status: DC | PRN
  Administered 2022-09-14: 0.25 mg via ORAL
  Filled 2022-09-11: qty 1

## 2022-09-11 MED ORDER — DEXTROSE 50 % IV SOLN
25.0000 mL | Freq: Once | INTRAVENOUS | Status: AC
  Administered 2022-09-11: 25 mL via INTRAVENOUS
  Filled 2022-09-11: qty 50

## 2022-09-11 MED ORDER — ACETAMINOPHEN 325 MG PO TABS
650.0000 mg | ORAL_TABLET | Freq: Four times a day (QID) | ORAL | Status: DC | PRN
  Administered 2022-09-11 – 2022-09-14 (×3): 650 mg via ORAL
  Filled 2022-09-11 (×3): qty 2

## 2022-09-11 MED ORDER — METOPROLOL SUCCINATE ER 100 MG PO TB24
200.0000 mg | ORAL_TABLET | Freq: Every day | ORAL | Status: DC
  Administered 2022-09-11 – 2022-09-12 (×2): 200 mg via ORAL
  Filled 2022-09-11 (×2): qty 2

## 2022-09-11 MED ORDER — KCL IN DEXTROSE-NACL 20-5-0.9 MEQ/L-%-% IV SOLN
INTRAVENOUS | Status: AC
  Filled 2022-09-11 (×2): qty 1000

## 2022-09-11 MED ORDER — GABAPENTIN 300 MG PO CAPS
600.0000 mg | ORAL_CAPSULE | Freq: Three times a day (TID) | ORAL | Status: DC
  Administered 2022-09-11 – 2022-09-15 (×12): 600 mg via ORAL
  Filled 2022-09-11 (×12): qty 2

## 2022-09-11 MED ORDER — VITAMIN C 500 MG PO TABS
1000.0000 mg | ORAL_TABLET | Freq: Every day | ORAL | Status: DC
  Administered 2022-09-11: 1000 mg via ORAL
  Filled 2022-09-11: qty 2

## 2022-09-11 MED ORDER — QUETIAPINE FUMARATE 200 MG PO TABS
200.0000 mg | ORAL_TABLET | Freq: Every day | ORAL | Status: DC
  Administered 2022-09-11: 200 mg via ORAL
  Filled 2022-09-11: qty 1

## 2022-09-11 NOTE — Progress Notes (Signed)
   09/11/22 1950  BiPAP/CPAP/SIPAP  BiPAP/CPAP/SIPAP Pt Type Adult  Reason BIPAP/CPAP not in use Non-compliant (pt refused)

## 2022-09-11 NOTE — Evaluation (Signed)
Physical Therapy Evaluation Patient Details Name: Faith Flores MRN: [id number] DOB: 07-23-1955 Today's Date: 09/11/2022  History of Present Illness  Faith Flores is a 67 yr old female admitted to the hospital with L sided abdominal pain and nausea. She was found to have AKI and an ileus and hernia. PMH: abdominal surgeries, hernia repair, chronic diastolic CHF, DM II, HTN, HLD, COPD, OSA, fibromyalgia, CKD III  Clinical Impression  Pt admitted with above diagnosis. At baseline, pt is from LTC at [facility] but is normally able to ambulate longer distances with rollator and rest breaks and perform her own ADLs.  She now is requiring assist for transfers, ADLs, and fatigues more easily.  Pt reports feeling below her normal mobility level for about 1 month.  Pt very pleasant and motivated, she is expected to progress well.   Pt currently with functional limitations due to the deficits listed below (see PT Problem List). Pt will benefit from acute skilled PT to increase their independence and safety with mobility to allow discharge.  At discharge, Patient will benefit from continued inpatient follow up therapy, <3 hours/day to help her return to baseline and decrease caregiver burden then likely transition back to long term care.          If plan is discharge home, recommend the following: A little help with walking and/or transfers;A little help with bathing/dressing/bathroom;Assistance with cooking/housework;Help with stairs or ramp for entrance   Can travel by private vehicle   Yes    Equipment Recommendations None recommended by PT  Recommendations for Other Services       Functional Status Assessment Patient has had a recent decline in their functional status and demonstrates the ability to make significant improvements in function in a reasonable and predictable amount of time.     Precautions / Restrictions Precautions Precautions: Fall Restrictions Weight Bearing Restrictions: No Other  Position/Activity Restrictions: O2 use at baseline      Mobility  Bed Mobility Overal bed mobility: Needs Assistance Bed Mobility: Supine to Sit, Sit to Supine     Supine to sit: Supervision, HOB elevated, Used rails Sit to supine: Used rails, Supervision        Transfers Overall transfer level: Needs assistance Equipment used: Rolling walker (2 wheels) Transfers: Sit to/from Stand Sit to Stand: Contact guard assist           General transfer comment: Increased time and effort to rise    Ambulation/Gait Ambulation/Gait assistance: Contact guard assist Gait Distance (Feet): 60 Feet Assistive device: Rolling walker (2 wheels) Gait Pattern/deviations: Step-through pattern, Decreased stride length Gait velocity: decreased     General Gait Details: Fatigued easily with some shortness of breath.  Cues to focus on breathing.  Pt reports harder and less endurance than baseline  Stairs            Wheelchair Mobility     Tilt Bed    Modified Rankin (Stroke Patients Only)       Balance Overall balance assessment: Needs assistance Sitting-balance support: No upper extremity supported Sitting balance-Leahy Scale: Good Sitting balance - Comments: no assist needed   Standing balance support: Bilateral upper extremity supported Standing balance-Leahy Scale: Poor Standing balance comment: Required RW ; steady with RW                             Pertinent Vitals/Pain Pain Assessment Pain Assessment: 0-10 Pain Score: 7  Pain Location: stomach Pain Descriptors /  Indicators: Discomfort Pain Intervention(s): Limited activity within patient's tolerance, Monitored during session, Premedicated before session    Home Living Family/patient expects to be discharged to:: Skilled nursing facility                   Additional Comments: Pt is a long term resident of NH on [place]. She is on 2 L O2.    Prior Function Prior Level of Function : Needs  assist             Mobility Comments: Walks on her own with rollator - could walk some in the hallways but had to take rest breaks ADLs Comments: Pt reports independent with ADLs up until the last month has needed min A with bathing     Extremity/Trunk Assessment   Upper Extremity Assessment Upper Extremity Assessment: Defer to OT evaluation    Lower Extremity Assessment Lower Extremity Assessment: LLE deficits/detail;RLE deficits/detail RLE Deficits / Details: Edema noted, which pt reported to be chronic in nature; Erythema noted on lower legs- pt reports increased from baseline; ROM: WFL; MMT: 4/5 LLE Deficits / Details: Edema noted, which pt reported to be chronic in nature; Erythema noted on lower legs- pt reports increased from baseline; ROM: WFL; MMT: 4/5    Cervical / Trunk Assessment Cervical / Trunk Assessment: Other exceptions Cervical / Trunk Exceptions: Some limitations due to body habitus but functional  Communication   Communication Communication: No apparent difficulties  Cognition Arousal: Alert Behavior During Therapy: WFL for tasks assessed/performed Overall Cognitive Status: Within Functional Limits for tasks assessed                                 General Comments: oriented to self and place; answers questions appropriately; motivated; good sense of humor        General Comments General comments (skin integrity, edema, etc.): VSS on 2 L O2    Exercises     Assessment/Plan    PT Assessment Patient needs continued PT services  PT Problem List Decreased strength;Decreased range of motion;Decreased activity tolerance;Decreased balance;Decreased mobility;Decreased knowledge of use of DME;Cardiopulmonary status limiting activity       PT Treatment Interventions DME instruction;Therapeutic exercise;Gait training;Functional mobility training;Therapeutic activities;Patient/family education;Balance training    PT Goals (Current goals can  be found in the Care Plan section)  Acute Rehab PT Goals Patient Stated Goal: improve her endurance and mobility to baseline PT Goal Formulation: With patient Time For Goal Achievement: 09/25/22 Potential to Achieve Goals: Good    Frequency Min 1X/week     Co-evaluation PT/OT/SLP Co-Evaluation/Treatment: Yes Reason for Co-Treatment: For patient/therapist safety PT goals addressed during session: Mobility/safety with mobility OT goals addressed during session: ADL's and self-care       AM-PAC PT "6 Clicks" Mobility  Outcome Measure Help needed turning from your back to your side while in a flat bed without using bedrails?: A Little Help needed moving from lying on your back to sitting on the side of a flat bed without using bedrails?: A Little Help needed moving to and from a bed to a chair (including a wheelchair)?: A Little Help needed standing up from a chair using your arms (e.g., wheelchair or bedside chair)?: A Little Help needed to walk in hospital room?: A Little Help needed climbing 3-5 steps with a railing? : Total 6 Click Score: 16    End of Session Equipment Utilized During Treatment: Gait belt Activity  Tolerance: Patient tolerated treatment well Patient left: in bed;with call bell/phone within reach;with bed alarm set Nurse Communication: Mobility status PT Visit Diagnosis: Other abnormalities of gait and mobility (R26.89);Muscle weakness (generalized) (M62.81)    Time: 1478-2956 PT Time Calculation (min) (ACUTE ONLY): 29 min   Charges:   PT Evaluation $PT Eval Low Complexity: 1 Low   PT General Charges $$ ACUTE PT VISIT: 1 Visit         Anise Salvo, PT Acute Rehab [facility] [phone]   Rayetta Humphrey 09/11/2022, 5:40 PM
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
## 2022-09-11 NOTE — Evaluation (Signed)
Occupational Therapy Evaluation Patient Details Name: Faith Flores MRN: [id number] DOB: September 13, 1955 Today's Date: 09/11/2022   History of Present Illness [name] is a 67 yr old female admitted to the hospital with L sided abdominal pain and nausea. She was found to have AKI and an ileus. PMH: abdominal surgeries, hernia repair, chronic diastolic CHF, DM II, HTN, HLD, COPD, OSA, fibromyalgia, CKD III   Clinical Impression   Pt is currently presenting below her baseline level of functioning for self-care management, given the below listed deficits (see OT problem list). She reported feelings of generalized weakness, as well as compromised endurance. She was further noted to be with deconditioning and B LE edema; she reported edema to be chronic in nature. She uses 2L O2 all the time at her baseline. She will benefit from further OT services to maximize her independence with self-care tasks and to decrease the risk for further weakness and deconditioning. OT recommends pt return to nursing facility at discharge.        If plan is discharge home, recommend the following: Direct supervision/assist for medications management;A little help with bathing/dressing/bathroom    Functional Status Assessment  Patient has had a recent decline in their functional status and demonstrates the ability to make significant improvements in function in a reasonable and predictable amount of time.  Equipment Recommendations  None recommended by OT    Recommendations for Other Services       Precautions / Restrictions Restrictions Weight Bearing Restrictions: No Other Position/Activity Restrictions: O2 use at baseline      Mobility Bed Mobility Overal bed mobility: Needs Assistance Bed Mobility: Supine to Sit, Sit to Supine     Supine to sit: Supervision, HOB elevated, Used rails Sit to supine: Used rails, Supervision        Transfers Overall transfer level: Needs assistance Equipment used:  Rolling walker (2 wheels) Transfers: Sit to/from Stand Sit to Stand: Contact guard assist         Balance       Sitting balance - Comments: static sitting-good. dynamic sitting-fair+       Standing balance comment: CGA with RW           ADL either performed or assessed with clinical judgement   ADL Overall ADL's : Needs assistance/impaired Eating/Feeding: Independent;Sitting Eating/Feeding Details (indicate cue type and reason): based on clinical judgement Grooming: Set up;Sitting           Upper Body Dressing : Set up;Sitting   Lower Body Dressing: Moderate assistance;Sit to/from stand   Toilet Transfer: Contact guard assist;Minimal assistance;Rolling walker (2 wheels);Ambulation;Grab bars Toilet Transfer Details (indicate cue type and reason): at bathroom level, based on clinical judgement                              Pertinent Vitals/Pain Pain Assessment Pain Assessment: 0-10 Pain Score: 7  Pain Location: stomach Pain Intervention(s): Limited activity within patient's tolerance, Monitored during session, Premedicated before session     Extremity/Trunk Assessment Upper Extremity Assessment Upper Extremity Assessment: Overall WFL for tasks assessed   Lower Extremity Assessment Lower Extremity Assessment: RLE deficits/detail;LLE deficits/detail;Generalized weakness RLE Deficits / Details: Edema noted, which pt reported to be chronic in nature LLE Deficits / Details: Edema noted, which pt reported to be chronic in nature       Communication Communication Communication: No apparent difficulties   Cognition Arousal: Alert Behavior During Therapy: [facility] for tasks assessed/performed Overall  Cognitive Status: Within Functional Limits for tasks assessed      General Comments:  (Oriented to person, place, and year. Disoriented to month. Able to follow 1-2 step commands without difficulty)                Home Living Family/patient expects to  be discharged to:: Skilled nursing facility        Additional Comments: Pt is a long term resident of nursing facility. She is on 2 L O2 at her baseline.      Prior Functioning/Environment Prior Level of Function :       Mobility Comments: Walks on her own with rollator - could walk some in the hallways but had to take rest breaks ADLs Comments: Pt reports modified independent to independent with ADLs except for needing min A with bathing over the past month        OT Problem List: Decreased strength;Decreased activity tolerance;Impaired balance (sitting and/or standing);Cardiopulmonary status limiting activity;Pain      OT Treatment/Interventions: Self-care/ADL training;Therapeutic exercise;Energy conservation;DME and/or AE instruction;Therapeutic activities;Patient/family education;Balance training    OT Goals(Current goals can be found in the care plan section) Acute Rehab OT Goals OT Goal Formulation: With patient Time For Goal Achievement: 09/25/22 Potential to Achieve Goals: Good ADL Goals Pt Will Perform Grooming: with supervision;standing Pt Will Perform Lower Body Dressing: with supervision;sit to/from stand Pt Will Transfer to Toilet: with supervision;ambulating;grab bars Pt Will Perform Toileting - Clothing Manipulation and hygiene: with supervision;sit to/from stand  OT Frequency: Min 1X/week    Co-evaluation PT/OT/SLP Co-Evaluation/Treatment: Yes Reason for Co-Treatment: To address functional/ADL transfers PT goals addressed during session: Mobility/safety with mobility OT goals addressed during session: ADL's and self-care      AM-PAC OT "6 Clicks" Daily Activity     Outcome Measure Help from another person eating meals?: None Help from another person taking care of personal grooming?: A Little Help from another person toileting, which includes using toliet, bedpan, or urinal?: A Little Help from another person bathing (including washing, rinsing, drying)?:  A Little Help from another person to put on and taking off regular upper body clothing?: A Little Help from another person to put on and taking off regular lower body clothing?: A Lot 6 Click Score: 18   End of Session Equipment Utilized During Treatment: Rolling walker (2 wheels);Oxygen Nurse Communication: Mobility status;Other (comment) (nurse cleared pt for therapy participation)  Activity Tolerance: Patient tolerated treatment well Patient left: in bed;with call bell/phone within reach;with bed alarm set  OT Visit Diagnosis: Muscle weakness (generalized) (M62.81)                Time: 9811-9147 OT Time Calculation (min): 29 min Charges:  OT General Charges $OT Visit: 1 Visit OT Evaluation $OT Eval Low Complexity: 1 Low    Tristine Langi L Breckin [place], OTR/L 09/11/2022, 5:07 PM

## 2022-09-11 NOTE — Inpatient Diabetes Management (Signed)
Inpatient Diabetes Program Recommendations  AACE/ADA: New Consensus Statement on Inpatient Glycemic Control   Target Ranges:  Prepandial:   less than 140 mg/dL      Peak postprandial:   less than 180 mg/dL (1-2 hours)      Critically ill patients:  140 - 180 mg/dL    Latest Reference Range & Units 09/11/22 02:20 09/11/22 03:49  Glucose-Capillary 70 - 99 mg/dL 60 (L) 737 (H)    Latest Reference Range & Units 09/10/22 21:37 09/10/22 21:43 09/10/22 22:41  Glucose-Capillary 70 - 99 mg/dL 45 (L) 106 (H) 62 (L)   Review of Glycemic Control  Diabetes history: DM2 Outpatient Diabetes medications: Semglee 36 units BID, Humulin R 0-26 units TID, Jardiance 25 mg daily Current orders for Inpatient glycemic control: None  Inpatient Diabetes Program Recommendations:    CBG monitoring: Please consider ordering CBGs Q2H and change to Q4H once no further hypoglycemia noted.  Insulin: Initially hypoglycemic upon arrival to hospital. CBG 60 mg/dl at 2:69 am today. Has D5LR @ 75 ml/hr that was ordered at 4:39 am today. Once hypoglycemia completely resolved, please consider ordering CBGs Q4H and Novolog 0-9 units Q4H.  Thanks, Orlando Penner, RN, MSN, CDCES Diabetes Coordinator Inpatient Diabetes Program [phone] (Team Pager from 8am to 5pm)

## 2022-09-11 NOTE — Progress Notes (Signed)
[facility] PROGRESS NOTE    Progress Note  Faith Flores  VHQ:[id number] DOB: [birth year] DOA: 09/10/2022 PCP: Patient, No Pcp Per     Brief Narrative:   Faith Flores is an 67 y.o. female past medical history significant for severe morbid obesity, multiple prior abdominal surgeries, ventral hernia repair history of ileus, chronic diastolic heart failure insulin-dependent diabetes mellitus type 2, fibromyalgia chronic kidney see stage IIIa comes in complaining of left-sided abdominal pain noncontrasted CT scan of the abdomen pelvis revealed for was suggestive of ileus and large [place] ventral hernia containing the mesh.  She has a gastric sleeve.  With moderate stool burden   Assessment/Plan:   Ileus (HCC) Started on IV fluid hydration, twice electrolytes [place] potassium greater than 4 magnesium greater than 2. Surgery has been consulted further management per them. Given a suppository overnight with no improvement.  Diabetes mellitus type 2 with hyperglycemia: Likely due to decreased oral intake in the setting of nausea and possible ileus. Started on D5 with lactated blood glucose is improved. Change IV fluids to D5 normal saline with potassium supplements. Patient continues to be n.p.o. diet per surgery.  Acute kidney injury on chronic kidney disease stage IIIa: With a baseline creatinine of 1.1 on admission of 2. Was started on IV fluids creatinine is improving nicely.  This morning is 1.9 continue IV fluids recheck in the morning.  Isolated elevated alkaline phosphatase:  nonspecific.  Chronic constipation: Was given a docusate suppository.  Nausea without vomiting: Likely due to #1 she relates her nausea is improved this morning.  Chronic diastolic heart failure: Holding diuretic therapy. Continue IV fluids monitor I's and O's and daily weights. Will be judicious with IV fluids.  Chronic anxiety/depression: Currently n.p.o. sips with meds resume home  regimen.  Hyperlipidemia:  continue statins P  Obstructive sleep apnea on CPAP: Resume CPAP at night.  Obesity hypoventilation syndrome/severe morbid obesity: Continue oxygen.  Right third toe cellulitis: Continue Bactrim.   Left thigh stage II present on admission pressure ulcer RN Pressure Injury Documentation: Pressure Injury 09/11/22 Thigh Left;Posterior;Proximal Stage 2 -  Partial thickness loss of dermis presenting as a shallow open injury with a red, pink wound bed without slough. (Active)  09/11/22 0059  Location: Thigh  Location Orientation: Left;Posterior;Proximal  Staging: Stage 2 -  Partial thickness loss of dermis presenting as a shallow open injury with a red, pink wound bed without slough.  Wound Description (Comments):   Present on Admission: Yes  Dressing Type Foam - Lift dressing to assess site every shift 09/11/22 0100    DVT prophylaxis: lovenox Family Communication:none Status is: Inpatient Remains inpatient appropriate because: Ileus    Code Status:     Code Status Orders  (From admission, onward)           Start     Ordered   09/10/22 2257  Full code  Continuous       Question:  By:  Answer:  Consent: discussion documented in EHR   09/10/22 2257           Code Status History     This patient has a current code status but no historical code status.      Advance Directive Documentation    Flowsheet Row Most Recent Value  Type of Advance Directive Living will  Pre-existing out of facility DNR order (yellow form or pink MOST form) --  "MOST" Form in Place? --         IV Access:  Peripheral IV   Procedures and diagnostic studies:   CT ABDOMEN PELVIS WO CONTRAST  Result Date: 09/10/2022 CLINICAL DATA:  Bowel obstruction suspected. Left side abdominal pain. EXAM: CT ABDOMEN AND PELVIS WITHOUT CONTRAST TECHNIQUE: Multidetector CT imaging of the abdomen and pelvis was performed following the standard protocol without IV  contrast. RADIATION DOSE REDUCTION: This exam was performed according to the departmental dose-optimization program which includes automated exposure control, adjustment of the mA and/or kV according to patient size and/or use of iterative reconstruction technique. COMPARISON:  05/08/2020 FINDINGS: Lower chest: No acute abnormality Hepatobiliary: No focal liver abnormality is seen. Status post cholecystectomy. Common bile duct mildly dilated at 12 mm compared to 18 mm previously. This is likely related to post cholecystectomy state. Pancreas: No focal abnormality or ductal dilatation. Spleen: No focal abnormality.  Normal size. Adrenals/Urinary Tract: No adrenal abnormality. No focal renal abnormality. No stones or hydronephrosis. Foley catheter in place with bladder decompressed. Stomach/Bowel: Laxity of anterior abdominal wall. There appears to been prior ventral hernia repair, but there is a large wide-mouth ventral hernia repair repair containing much of the large and small bowel. There are a few mildly dilated loops of upper abdominal small bowel, likely jejunal loops with air-fluid levels. Mild gaseous distention of the colon as well. Favor ileus over obstruction. Moderate stool burden. Changes of gastric sleeve. Vascular/Lymphatic: No evidence of aneurysm or adenopathy. Aortic atherosclerosis. Reproductive: Prior hysterectomy.  No adnexal masses. Other: No free fluid or free air. Musculoskeletal: No acute bony abnormality. IMPRESSION: Large wide-mouth left ventral hernia containing much of the large and small bowel. There is mild diffuse gaseous distention of the colon with a few mildly distended jejunal small bowel loops. Favor ileus. Prior gastric sleeve. Mild common bile duct dilatation, likely related to post cholecystectomy state. This is decreased since prior study. Aortic atherosclerosis. Electronically Signed   By: Charlett Nose M.D.   On: 09/10/2022 22:32     Medical Consultants:    None.   Subjective:    Faith Flores relates her pain is controlled still nauseated.  Has not had a bowel movement suppository did not work.  She is not passing gas  Objective:    Vitals:   09/10/22 2026 09/10/22 2300 09/11/22 0034 09/11/22 0327  BP: (!) 133/59 129/62 (!) 125/48 131/61  Pulse: 90 93 87 81  Resp: 16 16 15    Temp: 98.4 F (36.9 C)  98.9 F (37.2 C) 98.4 F (36.9 C)  TempSrc: Oral  Oral Oral  SpO2: 98% 93% 90% 92%  Weight:      Height:       SpO2: 92 % O2 Flow Rate (L/min): 2 L/min   Intake/Output Summary (Last 24 hours) at 09/11/2022 0718 Last data filed at 09/11/2022 0600 Gross per 24 hour  Intake 471.24 ml  Output 550 ml  Net -78.76 ml   Filed Weights   09/10/22 2021  Weight: (!) 141.5 kg    Exam: General exam: In no acute distress. Respiratory system: Good air movement and clear to auscultation. Cardiovascular system: S1 & S2 heard, RRR. No JVD,. Gastrointestinal system: Abdomen is nondistended, soft and nontender.  Multiple scars midline on the side with a bulging half a basketball ball not very tender to palpation, but is likely her hernia. Extremities: No pedal edema. Skin: No rashes, lesions or ulcers Psychiatry: Judgement and insight appear normal. Mood & affect appropriate.    Data Reviewed:    Labs: Basic Metabolic Panel: Recent Labs  Lab  09/10/22 2034 09/11/22 0408  NA 135 135  K 4.4 3.7  CL 98 97*  CO2 25 29  GLUCOSE 44* 119*  BUN 20 18  CREATININE 2.13* 1.92*  CALCIUM 8.8* 8.4*  MG  --  2.4  PHOS  --  4.5   GFR Estimated Creatinine Clearance: 38.8 mL/min (A) (by C-G formula based on SCr of 1.92 mg/dL (H)). Liver Function Tests: Recent Labs  Lab 09/10/22 2034 09/11/22 0408  AST 22 14*  ALT 11 9  ALKPHOS 145* 128*  BILITOT 0.9 0.6  PROT 7.6 6.5  ALBUMIN 3.6 3.1*   Recent Labs  Lab 09/10/22 2034  LIPASE 19   No results for input(s): "AMMONIA" in the last 168 hours. Coagulation profile No results for  input(s): "INR", "PROTIME" in the last 168 hours. COVID-19 Labs  No results for input(s): "DDIMER", "FERRITIN", "LDH", "CRP" in the last 72 hours.  No results found for: "SARSCOV2NAA"  CBC: Recent Labs  Lab 09/10/22 2034 09/11/22 0408  WBC 8.2 6.2  HGB 11.9* 10.5*  HCT 38.3 33.5*  MCV 83.8 84.4  PLT 164 146*   Cardiac Enzymes: No results for input(s): "CKTOTAL", "CKMB", "CKMBINDEX", "TROPONINI" in the last 168 hours. BNP (last 3 results) No results for input(s): "PROBNP" in the last 8760 hours. CBG: Recent Labs  Lab 09/10/22 2137 09/10/22 2143 09/10/22 2241 09/11/22 0220 09/11/22 0349  GLUCAP 45* 183* 62* 60* 118*   D-Dimer: No results for input(s): "DDIMER" in the last 72 hours. Hgb A1c: No results for input(s): "HGBA1C" in the last 72 hours. Lipid Profile: No results for input(s): "CHOL", "HDL", "LDLCALC", "TRIG", "CHOLHDL", "LDLDIRECT" in the last 72 hours. Thyroid function studies: No results for input(s): "TSH", "T4TOTAL", "T3FREE", "THYROIDAB" in the last 72 hours.  Invalid input(s): "FREET3" Anemia work up: No results for input(s): "VITAMINB12", "FOLATE", "FERRITIN", "TIBC", "IRON", "RETICCTPCT" in the last 72 hours. Sepsis Labs: Recent Labs  Lab 09/10/22 2034 09/10/22 2042 09/11/22 0408  WBC 8.2  --  6.2  LATICACIDVEN  --  1.8  --    Microbiology No results found for this or any previous visit (from the past 240 hour(s)).   Medications:    ascorbic acid  1,000 mg Oral Daily   aspirin EC  81 mg Oral Daily   atorvastatin  40 mg Oral Daily   cholecalciferol  2,000 Units Oral Daily   enoxaparin (LOVENOX) injection  40 mg Subcutaneous Q24H   QUEtiapine  200 mg Oral Daily   QUEtiapine  500 mg Oral QHS   senna  2 tablet Oral Daily   sertraline  200 mg Oral Daily   sulfamethoxazole-trimethoprim  1 tablet Oral Q12H   Continuous Infusions:  dextrose 5% lactated ringers 75 mL/hr at 09/11/22 0458      LOS: 1 day   Marinda Elk  [facility]  09/11/2022, 7:18 AM

## 2022-09-11 NOTE — Plan of Care (Signed)
  Problem: Education: Goal: Knowledge of General Education information will improve Description: Including pain rating scale, medication(s)/side effects and non-pharmacologic comfort measures Outcome: Progressing   Problem: Health Behavior/Discharge Planning: Goal: Ability to manage health-related needs will improve Outcome: Progressing   Problem: Clinical Measurements: Goal: Diagnostic test results will improve Outcome: Progressing Goal: Respiratory complications will improve Outcome: Progressing   Problem: Activity: Goal: Risk for activity intolerance will decrease Outcome: Progressing   Problem: Nutrition: Goal: Adequate nutrition will be maintained Outcome: Progressing   Problem: Elimination: Goal: Will not experience complications related to bowel motility Outcome: Progressing Goal: Will not experience complications related to urinary retention Outcome: Progressing   Problem: Pain Managment: Goal: General experience of comfort will improve Outcome: Progressing   Problem: Skin Integrity: Goal: Risk for impaired skin integrity will decrease Outcome: Progressing

## 2022-09-12 DIAGNOSIS — K5904 Chronic idiopathic constipation: Secondary | ICD-10-CM | POA: Diagnosis not present

## 2022-09-12 DIAGNOSIS — K567 Ileus, unspecified: Secondary | ICD-10-CM | POA: Diagnosis not present

## 2022-09-12 LAB — BASIC METABOLIC PANEL
Anion gap: 7 (ref 5–15)
BUN: 13 mg/dL (ref 8–23)
CO2: 26 mmol/L (ref 22–32)
Calcium: 8.5 mg/dL — ABNORMAL LOW (ref 8.9–10.3)
Chloride: 103 mmol/L (ref 98–111)
Creatinine, Ser: 1.43 mg/dL — ABNORMAL HIGH (ref 0.44–1.00)
GFR, Estimated: 40 mL/min — ABNORMAL LOW (ref >60)
Glucose, Bld: 164 mg/dL — ABNORMAL HIGH (ref 70–99)
Potassium: 4.4 mmol/L (ref 3.5–5.1)
Sodium: 136 mmol/L (ref 135–145)

## 2022-09-12 LAB — GLUCOSE, CAPILLARY
Glucose-Capillary: 173 mg/dL — ABNORMAL HIGH (ref 70–99)
Glucose-Capillary: 150 mg/dL — ABNORMAL HIGH (ref 70–99)
Glucose-Capillary: 358 mg/dL — ABNORMAL HIGH (ref 70–99)
Glucose-Capillary: 325 mg/dL — ABNORMAL HIGH (ref 70–99)

## 2022-09-12 LAB — CBC
HCT: 34.3 % — ABNORMAL LOW (ref 36.0–46.0)
Hemoglobin: 10.2 g/dL — ABNORMAL LOW (ref 12.0–15.0)
MCH: 26.1 pg (ref 26.0–34.0)
MCHC: 29.7 g/dL — ABNORMAL LOW (ref 30.0–36.0)
MCV: 87.7 fL (ref 80.0–100.0)
Platelets: 164 10*3/uL (ref 150–400)
RBC: 3.91 MIL/uL (ref 3.87–5.11)
RDW: 16.8 % — ABNORMAL HIGH (ref 11.5–15.5)
WBC: 5.6 10*3/uL (ref 4.0–10.5)
nRBC: 0 % (ref 0.0–0.2)

## 2022-09-12 MED ORDER — POLYETHYLENE GLYCOL 3350 17 G PO PACK
17.0000 g | PACK | Freq: Two times a day (BID) | ORAL | Status: AC
  Administered 2022-09-12 – 2022-09-13 (×4): 17 g via ORAL
  Filled 2022-09-12 (×4): qty 1

## 2022-09-12 MED ORDER — SULFAMETHOXAZOLE-TRIMETHOPRIM 800-160 MG PO TABS
1.0000 | ORAL_TABLET | Freq: Two times a day (BID) | ORAL | Status: AC
  Administered 2022-09-12 – 2022-09-13 (×3): 1 via ORAL
  Filled 2022-09-12 (×3): qty 1

## 2022-09-12 NOTE — Progress Notes (Signed)
   09/12/22 2059  BiPAP/CPAP/SIPAP  Reason BIPAP/CPAP not in use Non-compliant  BiPAP/CPAP /SiPAP Vitals  Resp 20  MEWS Score/Color  MEWS Score 1  MEWS Score Color [place]

## 2022-09-12 NOTE — Progress Notes (Signed)
Mobility Specialist - Progress Note  Pre-mobility: 93% SpO2 During mobility: 91% SpO2 Post-mobility: 95%  SPO2   09/12/22 1528  Mobility  Activity Ambulated with assistance in hallway  Level of Assistance Contact guard assist, steadying assist  Assistive Device Front wheel walker  Distance Ambulated (ft) 70 ft  Range of Motion/Exercises Active  Activity Response Tolerated fair  Mobility Referral Yes  $Mobility charge 1 Mobility  Mobility Specialist Start Time (ACUTE ONLY) 1500  Mobility Specialist Stop Time (ACUTE ONLY) 1528  Mobility Specialist Time Calculation (min) (ACUTE ONLY) 28 min   Pt was found in bathroom and agreeable to ambulate afterwards. Assisted pt in pericare. Got SOB with session and had x2 standing rest breaks during session. SPO2 maintained >91% during session. At EOS returned to bed with all needs met. Call bell in reach and RN notified.  Billey Chang Mobility Specialist

## 2022-09-12 NOTE — Progress Notes (Signed)
[facility] PROGRESS NOTE    Progress Note  Faith Flores  KVQ:[id number] DOB: [birth year] DOA: 09/10/2022 PCP: Patient, No Pcp Per     Brief Narrative:   Faith Flores is an 67 y.o. female past medical history significant for severe morbid obesity, multiple prior abdominal surgeries, ventral hernia repair history of ileus, chronic diastolic heart failure insulin-dependent diabetes mellitus type 2, fibromyalgia chronic kidney see stage IIIa comes in complaining of left-sided abdominal pain noncontrasted CT scan of the abdomen pelvis revealed for was suggestive of ileus and large wid[place]outh ventral hernia containing the mesh.  She has a gastric sleeve.  With moderate stool burden   Assessment/Plan:   Ileus (HCC) Started on IV fluid hydration, try to keep potassium greater than 4 magnesium greater than 2. Surgery was consulted, recommended to allow CLD and letting her have sips, and continue bowel prep for the next 1 to 2 days. Further management per surgery. If she becomes nauseated and vomiting we will need to place NG tube.  Diabetes mellitus type 2 with hyperglycemia: Likely due to decreased oral intake in the setting of nausea and possible ileus. Started IV fluids to D5 normal saline with potassium supplements. Following a diet, KVO IV fluids. A1c of 7.3.  Acute kidney injury on chronic kidney disease stage IIIa: With a baseline creatinine of 1.1 on admission of 2. Noted on IV fluids creatinine this morning is 1.3. Likely prerenal azotemia will allow oral hydration recheck basic metabolic panel in the morning.  Isolated elevated alkaline phosphatase:  nonspecific.  Chronic constipation: Was started on a bowel regimen and having small bowel movements. Avoid narcotics.  Nausea without vomiting: Likely due to #1 she relates her nausea is improved this morning.  Chronic diastolic heart failure: Holding diuretic therapy. KVO IV fluids, continue to hold diuretic  therapy. Can resume diuretics once she is able to maintain an adequate oral intake.  Chronic anxiety/depression: Currently n.p.o. sips with meds resume home regimen.  Hyperlipidemia:  continue statins P  Obstructive sleep apnea on CPAP: Resume CPAP at night.  Obesity hypoventilation syndrome/severe morbid obesity: Continue oxygen.  Right third toe cellulitis: Continue Bactrim.  Left thigh stage II present on admission pressure ulcer RN Pressure Injury Documentation: Pressure Injury 09/11/22 Thigh Left;Posterior;Proximal Stage 2 -  Partial thickness loss of dermis presenting as a shallow open injury with a red, pink wound bed without slough. (Active)  09/11/22 0059  Location: Thigh  Location Orientation: Left;Posterior;Proximal  Staging: Stage 2 -  Partial thickness loss of dermis presenting as a shallow open injury with a red, pink wound bed without slough.  Wound Description (Comments):   Present on Admission: Yes  Dressing Type Foam - Lift dressing to assess site every shift 09/11/22 2200    DVT prophylaxis: lovenox Family Communication:none Status is: Inpatient Remains inpatient appropriate because: Ileus    Code Status:     Code Status Orders  (From admission, onward)           Start     Ordered   09/10/22 2257  Full code  Continuous       Question:  By:  Answer:  Consent: discussion documented in EHR   09/10/22 2257           Code Status History     This patient has a current code status but no historical code status.      Advance Directive Documentation    Flowsheet Row Most Recent Value  Type of Advance Directive Living  will  Pre-existing out of facility DNR order (yellow form or pink MOST form) --  "MOST" Form in Place? --         IV Access:   Peripheral IV   Procedures and diagnostic studies:   CT ABDOMEN PELVIS WO CONTRAST  Result Date: 09/10/2022 CLINICAL DATA:  Bowel obstruction suspected. Left side abdominal pain. EXAM:  CT ABDOMEN AND PELVIS WITHOUT CONTRAST TECHNIQUE: Multidetector CT imaging of the abdomen and pelvis was performed following the standard protocol without IV contrast. RADIATION DOSE REDUCTION: This exam was performed according to the departmental dose-optimization program which includes automated exposure control, adjustment of the mA and/or kV according to patient size and/or use of iterative reconstruction technique. COMPARISON:  05/08/2020 FINDINGS: Lower chest: No acute abnormality Hepatobiliary: No focal liver abnormality is seen. Status post cholecystectomy. Common bile duct mildly dilated at 12 mm compared to 18 mm previously. This is likely related to post cholecystectomy state. Pancreas: No focal abnormality or ductal dilatation. Spleen: No focal abnormality.  Normal size. Adrenals/Urinary Tract: No adrenal abnormality. No focal renal abnormality. No stones or hydronephrosis. Foley catheter in place with bladder decompressed. Stomach/Bowel: Laxity of anterior abdominal wall. There appears to been prior ventral hernia repair, but there is a large wide-mouth ventral hernia repair repair containing much of the large and small bowel. There are a few mildly dilated loops of upper abdominal small bowel, likely jejunal loops with air-fluid levels. Mild gaseous distention of the colon as well. Favor ileus over obstruction. Moderate stool burden. Changes of gastric sleeve. Vascular/Lymphatic: No evidence of aneurysm or adenopathy. Aortic atherosclerosis. Reproductive: Prior hysterectomy.  No adnexal masses. Other: No free fluid or free air. Musculoskeletal: No acute bony abnormality. IMPRESSION: Large wide-mouth left ventral hernia containing much of the large and small bowel. There is mild diffuse gaseous distention of the colon with a few mildly distended jejunal small bowel loops. Favor ileus. Prior gastric sleeve. Mild common bile duct dilatation, likely related to post cholecystectomy state. This is decreased  since prior study. Aortic atherosclerosis. Electronically Signed   By: Charlett Nose M.D.   On: 09/10/2022 22:32     Medical Consultants:   None.   Subjective:    Faith Flores having movement tolerated the breakfast this morning.  Objective:    Vitals:   09/12/22 0009 09/12/22 0410 09/12/22 0434 09/12/22 0546  BP: (!) 104/46 (!) 147/131  (!) 110/48  Pulse:  66    Resp:  19    Temp:  98.4 F (36.9 C)    TempSrc:  Oral    SpO2:  95%    Weight:   (!) 145 kg   Height:       SpO2: 95 % O2 Flow Rate (L/min): 2 L/min   Intake/Output Summary (Last 24 hours) at 09/12/2022 1037 Last data filed at 09/12/2022 0647 Gross per 24 hour  Intake 3068.75 ml  Output 1550 ml  Net 1518.75 ml   Filed Weights   09/10/22 2021 09/12/22 0434  Weight: (!) 141.5 kg (!) 145 kg    Exam: General exam: In no acute distress. Respiratory system: Good air movement and clear to auscultation. Cardiovascular system: S1 & S2 heard, RRR. No JVD. Gastrointestinal system: Abdomen is nondistended, soft and nontender.  Multiple scars midline on the side with a bulging half a basketball ball not very tender to palpation, but is likely her hernia.  Psychiatry: Judgement and insight appear normal. Mood & affect appropriate.   Data Reviewed:  Labs: Basic Metabolic Panel: Recent Labs  Lab 09/10/22 2034 09/11/22 0408 09/12/22 0420  NA 135 135 136  K 4.4 3.7 4.4  CL 98 97* 103  CO2 25 29 26   GLUCOSE 44* 119* 164*  BUN 20 18 13   CREATININE 2.13* 1.92* 1.43*  CALCIUM 8.8* 8.4* 8.5*  MG  --  2.4  --   PHOS  --  4.5  --    GFR Estimated Creatinine Clearance: 53 mL/min (A) (by C-G formula based on SCr of 1.43 mg/dL (H)). Liver Function Tests: Recent Labs  Lab 09/10/22 2034 09/11/22 0408  AST 22 14*  ALT 11 9  ALKPHOS 145* 128*  BILITOT 0.9 0.6  PROT 7.6 6.5  ALBUMIN 3.6 3.1*   Recent Labs  Lab 09/10/22 2034  LIPASE 19   No results for input(s): "AMMONIA" in the last 168  hours. Coagulation profile No results for input(s): "INR", "PROTIME" in the last 168 hours. COVID-19 Labs  No results for input(s): "DDIMER", "FERRITIN", "LDH", "CRP" in the last 72 hours.  No results found for: "SARSCOV2NAA"  CBC: Recent Labs  Lab 09/10/22 2034 09/11/22 0408 09/12/22 0420  WBC 8.2 6.2 5.6  HGB 11.9* 10.5* 10.2*  HCT 38.3 33.5* 34.3*  MCV 83.8 84.4 87.7  PLT 164 146* 164   Cardiac Enzymes: No results for input(s): "CKTOTAL", "CKMB", "CKMBINDEX", "TROPONINI" in the last 168 hours. BNP (last 3 results) No results for input(s): "PROBNP" in the last 8760 hours. CBG: Recent Labs  Lab 09/11/22 1543 09/11/22 2007 09/11/22 2354 09/12/22 0407 09/12/22 0737  GLUCAP 204* 248* 181* 173* 150*   D-Dimer: No results for input(s): "DDIMER" in the last 72 hours. Hgb A1c: Recent Labs    09/11/22 0437  HGBA1C 7.3*   Lipid Profile: No results for input(s): "CHOL", "HDL", "LDLCALC", "TRIG", "CHOLHDL", "LDLDIRECT" in the last 72 hours. Thyroid function studies: No results for input(s): "TSH", "T4TOTAL", "T3FREE", "THYROIDAB" in the last 72 hours.  Invalid input(s): "FREET3" Anemia work up: No results for input(s): "VITAMINB12", "FOLATE", "FERRITIN", "TIBC", "IRON", "RETICCTPCT" in the last 72 hours. Sepsis Labs: Recent Labs  Lab 09/10/22 2034 09/10/22 2042 09/11/22 0408 09/12/22 0420  WBC 8.2  --  6.2 5.6  LATICACIDVEN  --  1.8  --   --    Microbiology No results found for this or any previous visit (from the past 240 hour(s)).   Medications:    ARIPiprazole  5 mg Oral Daily   aspirin EC  81 mg Oral Daily   atorvastatin  40 mg Oral Daily   Chlorhexidine Gluconate Cloth  6 each Topical Daily   diltiazem  30 mg Oral TID   enoxaparin (LOVENOX) injection  40 mg Subcutaneous Q24H   gabapentin  600 mg Oral Q8H   magnesium oxide  400 mg Oral Daily   melatonin  5 mg Oral QHS   metoprolol  200 mg Oral Daily   QUEtiapine  500 mg Oral QHS   senna  2  tablet Oral Daily   sertraline  200 mg Oral Daily   spironolactone  25 mg Oral Daily   sulfamethoxazole-trimethoprim  1 tablet Oral Q12H   tiZANidine  2 mg Oral Q8H   Continuous Infusions:      LOS: 2 days   Marinda Elk  [facility]  09/12/2022, 10:37 AM

## 2022-09-12 NOTE — Progress Notes (Signed)
Subjective/Chief Complaint: Complains of abd pain and nausea. No vomiting. Had a couple small bm's yesterday   Objective: Vital signs in last 24 hours: Temp:  [98.1 F (36.7 C)-98.4 F (36.9 C)] 98.4 F (36.9 C) (09/07 0410) Pulse Rate:  [65-96] 66 (09/07 0410) Resp:  [18-19] 19 (09/07 0410) BP: (102-147)/(41-131) 110/48 (09/07 0546) SpO2:  [93 %-97 %] 95 % (09/07 0410) Weight:  [145 kg] 145 kg (09/07 0434) Last BM Date : 09/11/22  Intake/Output from previous day: 09/06 0701 - 09/07 0700 In: 3068.8 [P.O.:1440; I.V.:1628.8] Out: 1550 [Urine:1550] Intake/Output this shift: No intake/output data recorded.  General appearance: alert, cooperative, and morbidly obese Resp: clear to auscultation bilaterally Cardio: regular rate and rhythm GI: soft, not distended. Tender on left side  Lab Results:  Recent Labs    09/11/22 0408 09/12/22 0420  WBC 6.2 5.6  HGB 10.5* 10.2*  HCT 33.5* 34.3*  PLT 146* 164   BMET Recent Labs    09/11/22 0408 09/12/22 0420  NA 135 136  K 3.7 4.4  CL 97* 103  CO2 29 26  GLUCOSE 119* 164*  BUN 18 13  CREATININE 1.92* 1.43*  CALCIUM 8.4* 8.5*   PT/INR No results for input(s): "LABPROT", "INR" in the last 72 hours. ABG No results for input(s): "PHART", "HCO3" in the last 72 hours.  Invalid input(s): "PCO2", "PO2"  Studies/Results: CT ABDOMEN PELVIS WO CONTRAST  Result Date: 09/10/2022 CLINICAL DATA:  Bowel obstruction suspected. Left side abdominal pain. EXAM: CT ABDOMEN AND PELVIS WITHOUT CONTRAST TECHNIQUE: Multidetector CT imaging of the abdomen and pelvis was performed following the standard protocol without IV contrast. RADIATION DOSE REDUCTION: This exam was performed according to the departmental dose-optimization program which includes automated exposure control, adjustment of the mA and/or kV according to patient size and/or use of iterative reconstruction technique. COMPARISON:  05/08/2020 FINDINGS: Lower chest: No acute  abnormality Hepatobiliary: No focal liver abnormality is seen. Status post cholecystectomy. Common bile duct mildly dilated at 12 mm compared to 18 mm previously. This is likely related to post cholecystectomy state. Pancreas: No focal abnormality or ductal dilatation. Spleen: No focal abnormality.  Normal size. Adrenals/Urinary Tract: No adrenal abnormality. No focal renal abnormality. No stones or hydronephrosis. Foley catheter in place with bladder decompressed. Stomach/Bowel: Laxity of anterior abdominal wall. There appears to been prior ventral hernia repair, but there is a large wide-mouth ventral hernia repair repair containing much of the large and small bowel. There are a few mildly dilated loops of upper abdominal small bowel, likely jejunal loops with air-fluid levels. Mild gaseous distention of the colon as well. Favor ileus over obstruction. Moderate stool burden. Changes of gastric sleeve. Vascular/Lymphatic: No evidence of aneurysm or adenopathy. Aortic atherosclerosis. Reproductive: Prior hysterectomy.  No adnexal masses. Other: No free fluid or free air. Musculoskeletal: No acute bony abnormality. IMPRESSION: Large wide-mouth left ventral hernia containing much of the large and small bowel. There is mild diffuse gaseous distention of the colon with a few mildly distended jejunal small bowel loops. Favor ileus. Prior gastric sleeve. Mild common bile duct dilatation, likely related to post cholecystectomy state. This is decreased since prior study. Aortic atherosclerosis. Electronically Signed   By: Charlett Nose M.D.   On: 09/10/2022 22:32    Anti-infectives: Anti-infectives (From admission, onward)    Start     Dose/Rate Route Frequency Ordered Stop   09/11/22 0530  sulfamethoxazole-trimethoprim (BACTRIM DS) 800-160 MG per tablet 1 tablet        1  tablet Oral Every 12 hours 09/11/22 0442         Assessment/Plan: s/p * No surgery found * Advance diet slowly once nausea resolves Ileus vs  SBO - patient with hx of constipation and ileus, suspect ileus is more likely given hx and chronic immobility - CT 9/5 with large wide mouth left ventral hernia with mild diffuse gaseous distention of colon with few mildly distended small bowel loops, favor ileus  - patient had previously stopped bowel regimen, recommend reinitiating medications for this  - not currently having nausea or vomiting - ok to have CLD and would recommend letting her slowly sip on a bowel prep over the next 1-2 days  - keep K >4.0 and Mg > 2.0, mobilize as able  - no indication for emergent surgical intervention at this time - hernia is wide mouthed and do not suspect obstruction related to this - if patient becomes nauseated and vomits then would recommend NGT placement and initiation of SBO protocol   FEN: CLD, IVF per [facility]  VTE: LMWH ID: PO bactrim    - per [facility] -  T2DM AKI on CKD stage IIIa Elevated Alk Phos Chronic diastolic CHF HLD HTN COPD Fibromyalgia OSA on CPAP Morbid Obesity - BMI 58.95 Anxiety/depression Chronic constipation   LOS: 2 days    Faith Flores 09/12/2022

## 2022-09-13 DIAGNOSIS — K5904 Chronic idiopathic constipation: Secondary | ICD-10-CM | POA: Diagnosis not present

## 2022-09-13 DIAGNOSIS — E1142 Type 2 diabetes mellitus with diabetic polyneuropathy: Secondary | ICD-10-CM | POA: Diagnosis not present

## 2022-09-13 DIAGNOSIS — F3160 Bipolar disorder, current episode mixed, unspecified: Secondary | ICD-10-CM | POA: Diagnosis not present

## 2022-09-13 DIAGNOSIS — Z6841 Body Mass Index (BMI) 40.0 and over, adult: Secondary | ICD-10-CM

## 2022-09-13 DIAGNOSIS — L8992 Pressure ulcer of unspecified site, stage 2: Secondary | ICD-10-CM

## 2022-09-13 DIAGNOSIS — K567 Ileus, unspecified: Secondary | ICD-10-CM | POA: Diagnosis not present

## 2022-09-13 LAB — BASIC METABOLIC PANEL
Anion gap: 8 (ref 5–15)
BUN: 10 mg/dL (ref 8–23)
CO2: 24 mmol/L (ref 22–32)
Calcium: 8.5 mg/dL — ABNORMAL LOW (ref 8.9–10.3)
Chloride: 101 mmol/L (ref 98–111)
Creatinine, Ser: 1.36 mg/dL — ABNORMAL HIGH (ref 0.44–1.00)
GFR, Estimated: 43 mL/min — ABNORMAL LOW (ref >60)
Glucose, Bld: 238 mg/dL — ABNORMAL HIGH (ref 70–99)
Potassium: 4.3 mmol/L (ref 3.5–5.1)
Sodium: 133 mmol/L — ABNORMAL LOW (ref 135–145)

## 2022-09-13 LAB — GLUCOSE, CAPILLARY
Glucose-Capillary: 208 mg/dL — ABNORMAL HIGH (ref 70–99)
Glucose-Capillary: 209 mg/dL — ABNORMAL HIGH (ref 70–99)
Glucose-Capillary: 220 mg/dL — ABNORMAL HIGH (ref 70–99)
Glucose-Capillary: 238 mg/dL — ABNORMAL HIGH (ref 70–99)
Glucose-Capillary: 279 mg/dL — ABNORMAL HIGH (ref 70–99)
Glucose-Capillary: 301 mg/dL — ABNORMAL HIGH (ref 70–99)
Glucose-Capillary: 450 mg/dL — ABNORMAL HIGH (ref 70–99)

## 2022-09-13 MED ORDER — METOPROLOL SUCCINATE ER 50 MG PO TB24: 150.0000 mg | ORAL_TABLET | Freq: Every day | ORAL | Status: DC

## 2022-09-13 MED ORDER — INSULIN ASPART 100 UNIT/ML IJ SOLN
0.0000 [IU] | Freq: Every day | INTRAMUSCULAR | Status: DC
  Administered 2022-09-13 – 2022-09-14 (×2): 2 [IU] via SUBCUTANEOUS

## 2022-09-13 MED ORDER — INSULIN GLARGINE-YFGN 100 UNIT/ML ~~LOC~~ SOLN
10.0000 [IU] | Freq: Every day | SUBCUTANEOUS | Status: DC
  Administered 2022-09-13: 10 [IU] via SUBCUTANEOUS
  Filled 2022-09-13: qty 0.1

## 2022-09-13 MED ORDER — INSULIN ASPART 100 UNIT/ML IJ SOLN
3.0000 [IU] | Freq: Three times a day (TID) | INTRAMUSCULAR | Status: DC
  Administered 2022-09-14 – 2022-09-15 (×4): 3 [IU] via SUBCUTANEOUS

## 2022-09-13 MED ORDER — INSULIN ASPART 100 UNIT/ML IJ SOLN: 0.0000 [IU] | Freq: Three times a day (TID) | INTRAMUSCULAR | Status: DC

## 2022-09-13 MED ORDER — METOPROLOL SUCCINATE ER 100 MG PO TB24
100.0000 mg | ORAL_TABLET | Freq: Every day | ORAL | Status: DC
  Filled 2022-09-13 (×2): qty 1

## 2022-09-13 NOTE — Progress Notes (Signed)
   Subjective/Chief Complaint: No complaints. Wants to go home   Objective: Vital signs in last 24 hours: Temp:  [98.2 F (36.8 C)-98.6 F (37 C)] 98.2 F (36.8 C) (09/08 0404) Pulse Rate:  [50-57] 50 (09/08 0404) Resp:  [18-20] 18 (09/08 0404) BP: (96-107)/(37-48) 107/44 (09/08 0404) SpO2:  [93 %-97 %] 96 % (09/08 0404) Weight:  [139.3 kg] 139.3 kg (09/08 0404) Last BM Date : 09/12/22  Intake/Output from previous day: 09/07 0701 - 09/08 0700 In: 1240 [P.O.:1240] Out: 2350 [Urine:2350] Intake/Output this shift: No intake/output data recorded.  General appearance: alert and cooperative Resp: clear to auscultation bilaterally Cardio: regular rate and rhythm GI: soft, large ventral hernia not obstructed  Lab Results:  Recent Labs    09/11/22 0408 09/12/22 0420  WBC 6.2 5.6  HGB 10.5* 10.2*  HCT 33.5* 34.3*  PLT 146* 164   BMET Recent Labs    09/12/22 0420 09/13/22 0356  NA 136 133*  K 4.4 4.3  CL 103 101  CO2 26 24  GLUCOSE 164* 238*  BUN 13 10  CREATININE 1.43* 1.36*  CALCIUM 8.5* 8.5*   PT/INR No results for input(s): "LABPROT", "INR" in the last 72 hours. ABG No results for input(s): "PHART", "HCO3" in the last 72 hours.  Invalid input(s): "PCO2", "PO2"  Studies/Results: No results found.  Anti-infectives: Anti-infectives (From admission, onward)    Start     Dose/Rate Route Frequency Ordered Stop   09/12/22 2200  sulfamethoxazole-trimethoprim (BACTRIM DS) 800-160 MG per tablet 1 tablet        1 tablet Oral Every 12 hours 09/12/22 1041 09/14/22 0959   09/11/22 0530  sulfamethoxazole-trimethoprim (BACTRIM DS) 800-160 MG per tablet 1 tablet  Status:  Discontinued        1 tablet Oral Every 12 hours 09/11/22 0442 09/12/22 1041       Assessment/Plan: s/p * No surgery found * Advance diet Ileus vs SBO - patient with hx of constipation and ileus, suspect ileus is more likely given hx and chronic immobility - CT 9/5 with large wide mouth  left ventral hernia with mild diffuse gaseous distention of colon with few mildly distended small bowel loops, favor ileus  - patient had previously stopped bowel regimen, recommend reinitiating medications for this  - not currently having nausea or vomiting - ok to have CLD and would recommend letting her slowly sip on a bowel prep over the next 1-2 days  - keep K >4.0 and Mg > 2.0, mobilize as able  - no indication for emergent surgical intervention at this time - hernia is wide mouthed and do not suspect obstruction related to this - if patient becomes nauseated and vomits then would recommend NGT placement and initiation of SBO protocol   FEN: CLD, IVF per [facility]  VTE: LMWH ID: PO bactrim    - per [facility] -  T2DM AKI on CKD stage IIIa Elevated Alk Phos Chronic diastolic CHF HLD HTN COPD Fibromyalgia OSA on CPAP Morbid Obesity - BMI 58.95 Anxiety/depression Chronic constipation   LOS: 3 days    Chevis Pretty III 09/13/2022

## 2022-09-13 NOTE — Progress Notes (Signed)
   09/13/22 2012  BiPAP/CPAP/SIPAP  Reason BIPAP/CPAP not in use Non-compliant  BiPAP/CPAP /SiPAP Vitals  Resp 19  MEWS Score/Color  MEWS Score 0  MEWS Score Color [place] Si
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
## 2022-09-13 NOTE — Progress Notes (Signed)
Mobility Specialist - Progress Note   09/13/22 1158  Mobility  Activity Ambulated with assistance in hallway;Ambulated with assistance to bathroom  Level of Assistance Standby assist, set-up cues, supervision of patient - no hands on  Assistive Device Front wheel walker  Distance Ambulated (ft) 150 ft  Activity Response Tolerated well  Mobility Referral Yes  $Mobility charge 1 Mobility  Mobility Specialist Start Time (ACUTE ONLY) 1125  Mobility Specialist Stop Time (ACUTE ONLY) 1158  Mobility Specialist Time Calculation (min) (ACUTE ONLY) 33 min   Pt received in bed and agreeable to mobility. Pt was minA from STS. Pt took x2 standing rest breaks due to SOB. No complaints during session. Upon returning to room, pt requested assistance to bathroom for BM. Pt to EOB after session with all needs met. NT in room.  Pre-mobility: 60 HR, 93% SpO2 (2L [place]) During mobility: 71 HR, 92% SpO2 (2L [place]) Post-mobility: 68 HR, 90%SPO2 (2L [place])  Chief Technology Officer

## 2022-09-13 NOTE — Progress Notes (Signed)
PROGRESS NOTE  OPHELIA QUILTY WGN:[id number] DOB: 28-Sep-1955   PCP: Patient, No Pcp Per  Patient is from: Nursing home  DOA: 09/10/2022 LOS: 3  Chief complaints Chief Complaint  Patient presents with   Abdominal Pain   Constipation     Brief Narrative / Interim history: 67 year old F with PMH of  multiple prior abdominal surgeries, ventral hernia, ileus, diastolic CHF, CKD-3A, DM-2, anxiety, depression, OSA not on CPAP and morbid obesity presenting with nausea, constipation, abdominal pain and low glucose, and admitted for ileus versus SBO, AKI and elevated ALP.  CT abdomen and pelvis showed ileus versus SBO, large [place] ventral hernia containing the mesh, and moderate stool burden.  General surgery consulted and following.  Patient had some bowel movements since admission.   Subjective: Seen and examined earlier this morning.  No major events overnight of this morning.  Complains LUQ pain that she attributes to hernia.  Has not had a bowel movement despite prep.  Denies passing gas today.  No nausea or vomiting.  Likes to eat solid food.  No complaints.  Objective: Vitals:   09/12/22 2039 09/12/22 2059 09/13/22 0404 09/13/22 0404  BP: (!) 100/48   (!) 107/44  Pulse: (!) 53   (!) 50  Resp: 20 20  18   Temp: 98.2 F (36.8 C)   98.2 F (36.8 C)  TempSrc: Oral   Oral  SpO2: 97%   96%  Weight:   (!) 139.3 kg   Height:        Examination:  GENERAL: No apparent distress.  Nontoxic. HEENT: MMM.  Vision and hearing grossly intact.  NECK: Supple.  No apparent JVD.  RESP:  No IWOB.  Fair aeration bilaterally but limited exam due to body habitus. CVS: Bradycardia to 50s.  Heart sounds normal.  ABD/GI/GU: BS+. Abd soft, NTND.  MSK/EXT:  Moves extremities.  Difficult to assess edema due to body habitus SKIN: no apparent skin lesion or wound NEURO: Awake, alert and oriented appropriately.  No apparent focal neuro deficit. PSYCH: Calm. Normal affect.   Procedures:   None  Microbiology summarized: None  Assessment and plan: Principal Problem:   Ileus (HCC) Active Problems:   BIPOLAR I, MIXED, MOST RECENT EPSD NOS   HYPERTENSION, BENIGN ESSENTIAL   Chronic idiopathic constipation   Diabetic polyneuropathy associated with type 2 diabetes mellitus (HCC)   Morbid obesity with BMI of 45.0-49.9, adult (HCC)   Pressure injury of skin  Ileus versus small bowel obstruction: Patient with multiple abdominal surgeries and ventral hernia without incarceration.  Has some bowel movement since admission -Appreciate help by general surgery-advance to FLD. -Continue bowel regimen  Uncontrolled IDDM-2 with hyperglycemia: A1c 7.3%. Recent Labs  Lab 09/12/22 2042 09/13/22 0008 09/13/22 0356 09/13/22 0759 09/13/22 1202  GLUCAP 325* 279* 209* 208* 220*  -Start SSI -Monitor CBG   AKI on CKD-3A: Baseline Cr ~1.1.  Improved. Recent Labs    09/10/22 2034 09/11/22 0408 09/12/22 0420 09/13/22 0356  BUN 20 18 13 10   CREATININE 2.13* 1.92* 1.43* 1.36*  -Continue monitoring   Sinus bradycardia/hypotension -Decrease Toprol-XL from 200 to 100 mg daily -Further adjustment as appropriate.  Isolated elevated alkaline phosphatase: Mild. -Monitor   Chronic constipation: -Continue bowel prep -Minimize or avoid narcotics   Nausea without vomiting/abdominal pain: Likely due to the above -Antiemetics as needed   Chronic diastolic heart failure: TTE in 2021 with LVEF of 55 to 60%, G2 DD, normal RVSP.  Stable but difficult to assess below to tolerate due  to body habitus. -Continue holding diuretics -Resume diuretics once ileus resolves.   Chronic anxiety/depression: -Continue home meds   Hyperlipidemia:  -Continue statins P   OSA/possible OHS not on CPAP. -Continue supplemental oxygen    Right third toe cellulitis: -Continue Bactrim.   Morbid obesity Body mass index is 58.04 kg/m. -Encourage lifestyle change to lose weight  Left thigh stage  II present on admission pressure ulcer RN Pressure Injury Documentation: Pressure Injury 09/11/22 Thigh Left;Posterior;Proximal Stage 2 -  Partial thickness loss of dermis presenting as a shallow open injury with a red, pink wound bed without slough. (Active)  09/11/22 0059  Location: Thigh  Location Orientation: Left;Posterior;Proximal  Staging: Stage 2 -  Partial thickness loss of dermis presenting as a shallow open injury with a red, pink wound bed without slough.  Wound Description (Comments):   Present on Admission: Yes  Dressing Type Foam - Lift dressing to assess site every shift 09/11/22 2200   DVT prophylaxis:  enoxaparin (LOVENOX) injection 40 mg Start: 09/11/22 1000  Code Status: Full code Family Communication: None at bedside Level of care: Telemetry Status is: Inpatient Remains inpatient appropriate because: Ileus, nausea, and AKI   Final disposition: Nursing home Consultants:  General surgery  35 minutes with more than 50% spent in reviewing records, counseling patient/family and coordinating care.   Sch Meds:  Scheduled Meds:  ARIPiprazole  5 mg Oral Daily   aspirin EC  81 mg Oral Daily   atorvastatin  40 mg Oral Daily   Chlorhexidine Gluconate Cloth  6 each Topical Daily   diltiazem  30 mg Oral TID   enoxaparin (LOVENOX) injection  40 mg Subcutaneous Q24H   gabapentin  600 mg Oral Q8H   magnesium oxide  400 mg Oral Daily   melatonin  5 mg Oral QHS   metoprolol  100 mg Oral Daily   polyethylene glycol  17 g Oral BID   QUEtiapine  500 mg Oral QHS   senna  2 tablet Oral Daily   sertraline  200 mg Oral Daily   spironolactone  25 mg Oral Daily   sulfamethoxazole-trimethoprim  1 tablet Oral Q12H   tiZANidine  2 mg Oral Q8H   Continuous Infusions: PRN Meds:.acetaminophen, ALPRAZolam, bisacodyl, dextrose, diclofenac Sodium, ipratropium-albuterol, prochlorperazine, traMADol  Antimicrobials: Anti-infectives (From admission, onward)    Start     Dose/Rate  Route Frequency Ordered Stop   09/12/22 2200  sulfamethoxazole-trimethoprim (BACTRIM DS) 800-160 MG per tablet 1 tablet        1 tablet Oral Every 12 hours 09/12/22 1041 09/14/22 0959   09/11/22 0530  sulfamethoxazole-trimethoprim (BACTRIM DS) 800-160 MG per tablet 1 tablet  Status:  Discontinued        1 tablet Oral Every 12 hours 09/11/22 0442 09/12/22 1041        I have personally reviewed the following labs and images: CBC: Recent Labs  Lab 09/10/22 2034 09/11/22 0408 09/12/22 0420  WBC 8.2 6.2 5.6  HGB 11.9* 10.5* 10.2*  HCT 38.3 33.5* 34.3*  MCV 83.8 84.4 87.7  PLT 164 146* 164   BMP &GFR Recent Labs  Lab 09/10/22 2034 09/11/22 0408 09/12/22 0420 09/13/22 0356  NA 135 135 136 133*  K 4.4 3.7 4.4 4.3  CL 98 97* 103 101  CO2 25 29 26 24   GLUCOSE 44* 119* 164* 238*  BUN 20 18 13 10   CREATININE 2.13* 1.92* 1.43* 1.36*  CALCIUM 8.8* 8.4* 8.5* 8.5*  MG  --  2.4  --   --  PHOS  --  4.5  --   --    Estimated Creatinine Clearance: 54.2 mL/min (A) (by C-G formula based on SCr of 1.36 mg/dL (H)). Liver & Pancreas: Recent Labs  Lab 09/10/22 2034 09/11/22 0408  AST 22 14*  ALT 11 9  ALKPHOS 145* 128*  BILITOT 0.9 0.6  PROT 7.6 6.5  ALBUMIN 3.6 3.1*   Recent Labs  Lab 09/10/22 2034  LIPASE 19   No results for input(s): "AMMONIA" in the last 168 hours. Diabetic: Recent Labs    09/11/22 0437  HGBA1C 7.3*   Recent Labs  Lab 09/12/22 2042 09/13/22 0008 09/13/22 0356 09/13/22 0759 09/13/22 1202  GLUCAP 325* 279* 209* 208* 220*   Cardiac Enzymes: No results for input(s): "CKTOTAL", "CKMB", "CKMBINDEX", "TROPONINI" in the last 168 hours. No results for input(s): "PROBNP" in the last 8760 hours. Coagulation Profile: No results for input(s): "INR", "PROTIME" in the last 168 hours. Thyroid Function Tests: No results for input(s): "TSH", "T4TOTAL", "FREET4", "T3FREE", "THYROIDAB" in the last 72 hours. Lipid Profile: No results for input(s): "CHOL",  "HDL", "LDLCALC", "TRIG", "CHOLHDL", "LDLDIRECT" in the last 72 hours. Anemia Panel: No results for input(s): "VITAMINB12", "FOLATE", "FERRITIN", "TIBC", "IRON", "RETICCTPCT" in the last 72 hours. Urine analysis:    Component Value Date/Time   COLORURINE YELLOW 09/11/2022 0421   APPEARANCEUR CLEAR 09/11/2022 0421   LABSPEC 1.010 09/11/2022 0421   PHURINE 5.0 09/11/2022 0421   GLUCOSEU >=500 (A) 09/11/2022 0421   HGBUR SMALL (A) 09/11/2022 0421   BILIRUBINUR NEGATIVE 09/11/2022 0421   KETONESUR NEGATIVE 09/11/2022 0421   PROTEINUR NEGATIVE 09/11/2022 0421   NITRITE NEGATIVE 09/11/2022 0421   LEUKOCYTESUR SMALL (A) 09/11/2022 0421   Sepsis Labs: Invalid input(s): "PROCALCITONIN", "LACTICIDVEN"  Microbiology: No results found for this or any previous visit (from the past 240 hour(s)).  Radiology Studies: No results found.    Fedor Kazmierski T. Takiya Belmares [facility]  If 7PM-7AM, please contact night-coverage [email or web address] 09/13/2022, 1:10 PM

## 2022-09-14 DIAGNOSIS — F3160 Bipolar disorder, current episode mixed, unspecified: Secondary | ICD-10-CM | POA: Diagnosis not present

## 2022-09-14 DIAGNOSIS — K567 Ileus, unspecified: Secondary | ICD-10-CM | POA: Diagnosis not present

## 2022-09-14 DIAGNOSIS — E1142 Type 2 diabetes mellitus with diabetic polyneuropathy: Secondary | ICD-10-CM | POA: Diagnosis not present

## 2022-09-14 DIAGNOSIS — K5904 Chronic idiopathic constipation: Secondary | ICD-10-CM | POA: Diagnosis not present

## 2022-09-14 LAB — COMPREHENSIVE METABOLIC PANEL
ALT: 11 U/L (ref 0–44)
AST: 11 U/L — ABNORMAL LOW (ref 15–41)
Albumin: 2.7 g/dL — ABNORMAL LOW (ref 3.5–5.0)
Alkaline Phosphatase: 134 U/L — ABNORMAL HIGH (ref 38–126)
Anion gap: 8 (ref 5–15)
BUN: 9 mg/dL (ref 8–23)
CO2: 23 mmol/L (ref 22–32)
Calcium: 8.5 mg/dL — ABNORMAL LOW (ref 8.9–10.3)
Chloride: 103 mmol/L (ref 98–111)
Creatinine, Ser: 1.3 mg/dL — ABNORMAL HIGH (ref 0.44–1.00)
GFR, Estimated: 45 mL/min — ABNORMAL LOW (ref >60)
Glucose, Bld: 195 mg/dL — ABNORMAL HIGH (ref 70–99)
Potassium: 4 mmol/L (ref 3.5–5.1)
Sodium: 134 mmol/L — ABNORMAL LOW (ref 135–145)
Total Bilirubin: 0.4 mg/dL (ref 0.3–1.2)
Total Protein: 6.2 g/dL — ABNORMAL LOW (ref 6.5–8.1)

## 2022-09-14 LAB — CBC
HCT: 34.3 % — ABNORMAL LOW (ref 36.0–46.0)
Hemoglobin: 10.5 g/dL — ABNORMAL LOW (ref 12.0–15.0)
MCH: 26.1 pg (ref 26.0–34.0)
MCHC: 30.6 g/dL (ref 30.0–36.0)
MCV: 85.1 fL (ref 80.0–100.0)
Platelets: 164 10*3/uL (ref 150–400)
RBC: 4.03 MIL/uL (ref 3.87–5.11)
RDW: 16 % — ABNORMAL HIGH (ref 11.5–15.5)
WBC: 6.6 10*3/uL (ref 4.0–10.5)
nRBC: 0 % (ref 0.0–0.2)

## 2022-09-14 LAB — GLUCOSE, CAPILLARY
Glucose-Capillary: 211 mg/dL — ABNORMAL HIGH (ref 70–99)
Glucose-Capillary: 208 mg/dL — ABNORMAL HIGH (ref 70–99)
Glucose-Capillary: 265 mg/dL — ABNORMAL HIGH (ref 70–99)
Glucose-Capillary: 238 mg/dL — ABNORMAL HIGH (ref 70–99)

## 2022-09-14 LAB — PHOSPHORUS: Phosphorus: 4 mg/dL (ref 2.5–4.6)

## 2022-09-14 LAB — MAGNESIUM: Magnesium: 2.2 mg/dL (ref 1.7–2.4)

## 2022-09-14 MED ORDER — GERHARDT'S BUTT CREAM
TOPICAL_CREAM | Freq: Two times a day (BID) | CUTANEOUS | Status: DC
  Filled 2022-09-14: qty 1

## 2022-09-14 MED ORDER — BISACODYL 10 MG RE SUPP
10.0000 mg | Freq: Once | RECTAL | Status: AC
  Administered 2022-09-14: 10 mg via RECTAL
  Filled 2022-09-14: qty 1

## 2022-09-14 MED ORDER — INSULIN GLARGINE-YFGN 100 UNIT/ML ~~LOC~~ SOLN
15.0000 [IU] | Freq: Every day | SUBCUTANEOUS | Status: DC
  Administered 2022-09-14: 15 [IU] via SUBCUTANEOUS
  Filled 2022-09-14 (×2): qty 0.15

## 2022-09-14 MED ORDER — MOMETASONE FURO-FORMOTEROL FUM 200-5 MCG/ACT IN AERO
2.0000 | INHALATION_SPRAY | Freq: Two times a day (BID) | RESPIRATORY_TRACT | Status: DC
  Administered 2022-09-14 – 2022-09-15 (×3): 2 via RESPIRATORY_TRACT
  Filled 2022-09-14: qty 8.8

## 2022-09-14 MED ORDER — INSULIN ASPART 100 UNIT/ML IJ SOLN
0.0000 [IU] | Freq: Three times a day (TID) | INTRAMUSCULAR | Status: DC
  Administered 2022-09-14: 8 [IU] via SUBCUTANEOUS
  Administered 2022-09-14 – 2022-09-15 (×2): 5 [IU] via SUBCUTANEOUS
  Administered 2022-09-15: 8 [IU] via SUBCUTANEOUS

## 2022-09-14 MED ORDER — LINACLOTIDE 145 MCG PO CAPS
290.0000 ug | ORAL_CAPSULE | Freq: Every day | ORAL | Status: DC
  Administered 2022-09-15: 290 ug via ORAL
  Filled 2022-09-14: qty 2

## 2022-09-14 MED ORDER — POLYETHYLENE GLYCOL 3350 17 G PO PACK
17.0000 g | PACK | Freq: Every day | ORAL | Status: DC
  Administered 2022-09-14 – 2022-09-15 (×2): 17 g via ORAL
  Filled 2022-09-14: qty 1

## 2022-09-14 MED ORDER — UMECLIDINIUM BROMIDE 62.5 MCG/ACT IN AEPB
1.0000 | INHALATION_SPRAY | Freq: Every day | RESPIRATORY_TRACT | Status: DC
  Administered 2022-09-14 – 2022-09-15 (×2): 1 via RESPIRATORY_TRACT
  Filled 2022-09-14: qty 7

## 2022-09-14 NOTE — Progress Notes (Signed)
[facility] Progress Note     Subjective: CC-  States that she feels about 50% better. Still has some left sided abdominal pain. A little nausea yesterday, no emesis. Passing a little flatus. She had a tiny BM yesterday.  Objective: Vital signs in last 24 hours: Temp:  [97.7 F (36.5 C)-98.2 F (36.8 C)] 97.8 F (36.6 C) (09/09 0502) Pulse Rate:  [51] 51 (09/09 0502) Resp:  [19-22] 20 (09/09 0502) BP: (115-125)/(37-58) 115/46 (09/09 0502) SpO2:  [97 %-98 %] 97 % (09/09 0502) Weight:  [140.4 kg] 140.4 kg (09/09 0518) Last BM Date : 09/13/22  Intake/Output from previous day: 09/08 0701 - 09/09 0700 In: 960 [P.O.:960] Out: 3250 [Urine:3250] Intake/Output this shift: No intake/output data recorded.  PE: Gen:  Alert, NAD, pleasant Abd: obese, soft, large left sided ventral hernia is soft without overlying skin changes  Lab Results:  Recent Labs    09/12/22 0420 09/14/22 0427  WBC 5.6 6.6  HGB 10.2* 10.5*  HCT 34.3* 34.3*  PLT 164 164   BMET Recent Labs    09/13/22 0356 09/14/22 0427  NA 133* 134*  K 4.3 4.0  CL 101 103  CO2 24 23  GLUCOSE 238* 195*  BUN 10 9  CREATININE 1.36* 1.30*  CALCIUM 8.5* 8.5*   PT/INR No results for input(s): "LABPROT", "INR" in the last 72 hours. CMP     Component Value Date/Time   NA 134 (L) 09/14/2022 0427   NA 138 12/04/2019 1125   K 4.0 09/14/2022 0427   CL 103 09/14/2022 0427   CO2 23 09/14/2022 0427   GLUCOSE 195 (H) 09/14/2022 0427   BUN 9 09/14/2022 0427   BUN 23 12/04/2019 1125   CREATININE 1.30 (H) 09/14/2022 0427   CALCIUM 8.5 (L) 09/14/2022 0427   PROT 6.2 (L) 09/14/2022 0427   ALBUMIN 2.7 (L) 09/14/2022 0427   AST 11 (L) 09/14/2022 0427   ALT 11 09/14/2022 0427   ALKPHOS 134 (H) 09/14/2022 0427   BILITOT 0.4 09/14/2022 0427   GFRNONAA 45 (L) 09/14/2022 0427   GFRAA 37 (L) 12/04/2019 1125   Lipase     Component Value Date/Time   LIPASE 19 09/10/2022 2034       Studies/Results: No  results found.  Anti-infectives: Anti-infectives (From admission, onward)    Start     Dose/Rate Route Frequency Ordered Stop   09/12/22 2200  sulfamethoxazole-trimethoprim (BACTRIM DS) 800-160 MG per tablet 1 tablet        1 tablet Oral Every 12 hours 09/12/22 1041 09/13/22 2258   09/11/22 0530  sulfamethoxazole-trimethoprim (BACTRIM DS) 800-160 MG per tablet 1 tablet  Status:  Discontinued        1 tablet Oral Every 12 hours 09/11/22 0442 09/12/22 1041        Assessment/Plan Ileus vs SBO - patient with hx of constipation and ileus, suspect ileus is more likely given hx and chronic immobility - CT 9/5 with large wide mouth left ventral hernia with mild diffuse gaseous distention of colon with few mildly distended small bowel loops, favor ileus  - hernia is wide mouthed and do not suspect obstruction related to this - keep K >4.0 and Mg > 2.0, mobilize as able - Slowly improving. Advance to soft diet. Continue aggressive bowel regimen. Dulcolax suppository today. Reorder home linzess.   FEN: soft diet VTE: LMWH ID: PO bactrim    - per TRH -  T2DM AKI on CKD stage IIIa Elevated Alk Phos Chronic diastolic  CHF HLD HTN COPD Fibromyalgia OSA on CPAP Morbid Obesity - BMI 58.95 Anxiety/depression Chronic constipation   I reviewed hospitalist notes, last 24 h vitals and pain scores, last 48 h intake and output, and last 24 h labs and trends.    LOS: 4 days    Franne Forts, [facility] 09/14/2022, 10:13 AM Please see Amion for pager number during day hours 7:00am-4:30pm

## 2022-09-14 NOTE — Consult Note (Addendum)
WOC Nurse Consult Note: patient states SNF putting Zinc on area prior to admission  Reason for Consult: L upper posterior thigh wound  Wound type: Stage 2 Pressure Injury L ischium  Pressure Injury POA: Yes Measurement: 0.5 cm x 0.5 cm x 0.1 cm  Wound bed: 100% pink moist  Drainage (amount, consistency, odor) minimal serosanguinous  Periwound: dark dusky discoloration likely consistent with chronic tissue damage  Dressing procedure/placement/frequency: Clean L ischial PI with NS, apply silicone foam dressing. Apply Gerhardt's Butt Cream to surrounding skin as well as skin of buttocks 2 times daily and prn soiling.  Lift foam daily to assess area.  Change foam q3 days and prn soiling.   POC discussed with patient and bedside nurse.  WOC team will not follow at this time.  Re-consult if further needs arise.   Thank you,    Priscella Mann MSN, RN-BC, [employer] [phone]

## 2022-09-14 NOTE — Progress Notes (Signed)
Physical Therapy Treatment Patient Details Name: Faith Flores MRN: [id number] DOB: 10/02/1955 Today's Date: 09/14/2022   History of Present Illness Faith Flores is a 67 yr old female admitted to the hospital with L sided abdominal pain and nausea. She was found to have AKI and an ileus and hernia. PMH: abdominal surgeries, hernia repair, chronic diastolic CHF, DM II, HTN, HLD, COPD, OSA, fibromyalgia, CKD III    PT Comments  Pt motivated and demonstrating good improvement.  She likes to push herself to improve and does need cues for self monitoring and rest breaks as she had shortness of breath with walking taking increased time to recover.  Sats dropped to 88% on 2 L but they improved quickly to 95%.  Continue POC.  Pt is from SNF with plan to return.    If plan is discharge home, recommend the following: A little help with walking and/or transfers;A little help with bathing/dressing/bathroom;Assistance with cooking/housework;Help with stairs or ramp for entrance   Can travel by private vehicle     Yes  Equipment Recommendations  None recommended by PT    Recommendations for Other Services       Precautions / Restrictions Precautions Precautions: Fall     Mobility  Bed Mobility Overal bed mobility: Needs Assistance Bed Mobility: Supine to Sit, Sit to Supine     Supine to sit: Supervision, HOB elevated, Used rails Sit to supine: Used rails, Supervision   General bed mobility comments: Pt able to pull self up in bed with bed flat    Transfers Overall transfer level: Needs assistance Equipment used: Rolling walker (2 wheels) Transfers: Sit to/from Stand Sit to Stand: Contact guard assist           General transfer comment: Increased time and effort to rise; performed x 3    Ambulation/Gait Ambulation/Gait assistance: Contact guard assist Gait Distance (Feet): 180 Feet Assistive device: Rolling walker (2 wheels) Gait Pattern/deviations: Step-through pattern,  Decreased stride length Gait velocity: decreased     General Gait Details: Pt taking 3 standing rest breaks (~30 sec each) and needed cues for rest breaks due to shortness of breath. With fatigue pt leaning onto R forearm on RW.  Pt had difficulty turning RW - reports uses rollator.  After walking needing at least 5 mins to recovery.   Stairs             Wheelchair Mobility     Tilt Bed    Modified Rankin (Stroke Patients Only)       Balance Overall balance assessment: Needs assistance Sitting-balance support: No upper extremity supported Sitting balance-Leahy Scale: Good Sitting balance - Comments: EOB for 3 mins prior to standing and then post walking to recover and exercises     Standing balance-Leahy Scale: Poor Standing balance comment: Required RW ; steady with RW                            Cognition Arousal: Alert Behavior During Therapy: WFL for tasks assessed/performed Overall Cognitive Status: Within Functional Limits for tasks assessed                                 General Comments: Overall WFL; some cues for safety/symptom monitoring (pt tries to push self)        Exercises General Exercises - Lower Extremity Ankle Circles/Pumps: AROM, Both, 15 reps, Seated Hip Flexion/Marching:  AROM, Both, 15 reps, Seated    General Comments General comments (skin integrity, edema, etc.): Pt on 2 L O2 with sats 95% rest, down to 91% ambulation and 88% during recovery with 1 min to recover to 95%, but 5 mins to no longer be short of breath.      Pertinent Vitals/Pain Pain Assessment Pain Assessment: No/denies pain    Home Living                          Prior Function            PT Goals (current goals can now be found in the care plan section) Progress towards PT goals: Progressing toward goals    Frequency    Min 1X/week      PT Plan      Co-evaluation              AM-PAC PT "6 Clicks" Mobility    Outcome Measure  Help needed turning from your back to your side while in a flat bed without using bedrails?: A Little Help needed moving from lying on your back to sitting on the side of a flat bed without using bedrails?: A Little Help needed moving to and from a bed to a chair (including a wheelchair)?: A Little Help needed standing up from a chair using your arms (e.g., wheelchair or bedside chair)?: A Little Help needed to walk in hospital room?: A Little Help needed climbing 3-5 steps with a railing? : Total 6 Click Score: 16    End of Session Equipment Utilized During Treatment: Gait belt Activity Tolerance: Patient tolerated treatment well Patient left: in bed;with call bell/phone within reach;with bed alarm set Nurse Communication: Mobility status PT Visit Diagnosis: Other abnormalities of gait and mobility (R26.89);Muscle weakness (generalized) (M62.81)     Time: 1610-9604 PT Time Calculation (min) (ACUTE ONLY): 26 min  Charges:    $Gait Training: 8-22 mins $Therapeutic Activity: 8-22 mins PT General Charges $$ ACUTE PT VISIT: 1 Visit                     Anise Salvo, PT Acute Rehab [facility] [phone]    Rayetta Humphrey 09/14/2022, 5:34 PM

## 2022-09-14 NOTE — Progress Notes (Signed)
   09/14/22 2008  BiPAP/CPAP/SIPAP  BiPAP/CPAP/SIPAP Pt Type Adult  Reason BIPAP/CPAP not in use Non-compliant  BiPAP/CPAP /SiPAP Vitals  SpO2 97 %  Bilateral Breath Sounds Clear;Diminished

## 2022-09-14 NOTE — Progress Notes (Signed)
PROGRESS NOTE  Faith Flores:[id number] DOB: 05-20-1955   PCP: Patient, No Pcp Per  Patient is from: Nursing home  DOA: 09/10/2022 LOS: 4  Chief complaints Chief Complaint  Patient presents with   Abdominal Pain   Constipation     Brief Narrative / Interim history: 67 year old F with PMH of  multiple prior abdominal surgeries, ventral hernia, ileus, diastolic CHF, CKD-3A, DM-2, anxiety, depression, OSA not on CPAP and morbid obesity presenting with nausea, constipation, abdominal pain and low glucose, and admitted for ileus versus SBO, AKI and elevated ALP.  CT abdomen and pelvis showed ileus versus SBO, large wid[place] ventral hernia containing the mesh, and moderate stool burden.  General surgery consulted and following.  Patient had some bowel movements since admission.   Subjective: Seen and examined earlier this morning.  Reports cough with yellowish and greenish phlegm although her cough appears to be dry during my encounter.  Also has left-sided abdominal pain and nausea.  Passing flatus but no bowel movement since yesterday.  Had a small bowel movement yesterday.  Objective: Vitals:   09/13/22 2012 09/13/22 2259 09/14/22 0502 09/14/22 0518  BP:  (!) 120/58 (!) 115/46   Pulse:   (!) 51   Resp: 19  20   Temp:   97.8 F (36.6 C)   TempSrc:   Oral   SpO2:   97%   Weight:    (!) 140.4 kg  Height:        Examination:  GENERAL: No apparent distress.  Nontoxic. HEENT: MMM.  Vision and hearing grossly intact.  NECK: Supple.  No apparent JVD.  RESP:  No IWOB.  Fair aeration.  Expiratory wheeze. CVS: Bradycardia to 50s.  Heart sounds normal.  ABD/GI/GU: BS+. Abd soft, NTND.  MSK/EXT:  Moves extremities.  Difficult to assess edema due to body habitus SKIN: no apparent skin lesion or wound NEURO: Awake, alert and oriented appropriately.  No apparent focal neuro deficit. PSYCH: Calm. Normal affect.   Procedures:  None  Microbiology summarized: None  Assessment  and plan: Principal Problem:   Ileus (HCC) Active Problems:   BIPOLAR I, MIXED, MOST RECENT EPSD NOS   HYPERTENSION, BENIGN ESSENTIAL   Chronic idiopathic constipation   Diabetic polyneuropathy associated with type 2 diabetes mellitus (HCC)   Morbid obesity with BMI of 45.0-49.9, adult (HCC)   Pressure injury of skin  Ileus versus small bowel obstruction: Patient with multiple abdominal surgeries and ventral hernia without incarceration.  Has some bowel movement since admission -Appreciate help by general surgery-advanced to soft diet. -Continue bowel regimen  Uncontrolled IDDM-2 with hyperglycemia: A1c 7.3%. Recent Labs  Lab 09/13/22 1202 09/13/22 1650 09/13/22 2007 09/13/22 2303 09/14/22 0717  GLUCAP 220* 450* 301* 238* 211*  -Increase SSI to moderate -Continue NovoLog 3 units 3 times daily with meals -Increase Semglee from 10 to 15 units daily -Further adjustment as appropriate.   AKI on CKD-3A: Baseline Cr ~1.1.  Improved. Recent Labs    09/10/22 2034 09/11/22 0408 09/12/22 0420 09/13/22 0356 09/14/22 0427  BUN 20 18 13 10 9   CREATININE 2.13* 1.92* 1.43* 1.36* 1.30*  -Continue monitoring  Sinus bradycardia/hypotension -Decreased Toprol-XL from 200 to 100 mg daily on 9/8 -Discontinue Cardizem and Aldactone -Further adjustment as appropriate.  Isolated elevated alkaline phosphatase: Mild and doubt relevance..   Chronic constipation: -Continue aggressive bowel regimen -Minimize or avoid narcotics   Nausea without vomiting/abdominal pain: Likely due to the above -Antiemetics as needed   Chronic diastolic heart failure: TTE  in 2021 with LVEF of 55 to 60%, G2 DD, normal RVSP.  Stable but difficult to assess below to tolerate due to body habitus. -Continue holding diuretics -Resume diuretics once ileus resolves.   Chronic anxiety/depression: -Continue home meds   Hyperlipidemia:  -Continue statins P   OSA/possible OHS not on CPAP. -Continue supplemental  oxygen  Cough and wheezing: No history of asthma or COPD.  She reports some improvement with DuoNebs.  Exam with expiratory wheeze. -Start Elwin Sleight and Incruse Ellipta -Continue DuoNeb as needed   Right third toe cellulitis: -Continue Bactrim.   Morbid obesity Body mass index is 58.48 kg/m. -Encourage lifestyle change to lose weight  Left thigh stage II present on admission pressure ulcer RN Pressure Injury Documentation: Pressure Injury 09/11/22 Thigh Left;Posterior;Proximal Stage 2 -  Partial thickness loss of dermis presenting as a shallow open injury with a red, pink wound bed without slough. (Active)  09/11/22 0059  Location: Thigh  Location Orientation: Left;Posterior;Proximal  Staging: Stage 2 -  Partial thickness loss of dermis presenting as a shallow open injury with a red, pink wound bed without slough.  Wound Description (Comments):   Present on Admission: Yes  Dressing Type Foam - Lift dressing to assess site every shift 09/13/22 2005   DVT prophylaxis:  enoxaparin (LOVENOX) injection 40 mg Start: 09/11/22 1000  Code Status: Full code Family Communication: None at bedside Level of care: Telemetry Status is: Inpatient Remains inpatient appropriate because: Ileus, nausea, and AKI   Final disposition: Nursing home Consultants:  General surgery  35 minutes with more than 50% spent in reviewing records, counseling patient/family and coordinating care.   Sch Meds:  Scheduled Meds:  ARIPiprazole  5 mg Oral Daily   aspirin EC  81 mg Oral Daily   atorvastatin  40 mg Oral Daily   Chlorhexidine Gluconate Cloth  6 each Topical Daily   enoxaparin (LOVENOX) injection  40 mg Subcutaneous Q24H   gabapentin  600 mg Oral Q8H   Gerhardt's butt cream   Topical BID   insulin aspart  0-15 Units Subcutaneous TID WC   insulin aspart  0-5 Units Subcutaneous QHS   insulin aspart  3 Units Subcutaneous TID WC   insulin glargine-yfgn  15 Units Subcutaneous QHS   [START ON  09/15/2022] linaclotide  290 mcg Oral QAC breakfast   magnesium oxide  400 mg Oral Daily   melatonin  5 mg Oral QHS   metoprolol  100 mg Oral Daily   mometasone-formoterol  2 puff Inhalation BID   polyethylene glycol  17 g Oral Daily   QUEtiapine  500 mg Oral QHS   senna  2 tablet Oral Daily   sertraline  200 mg Oral Daily   tiZANidine  2 mg Oral Q8H   umeclidinium bromide  1 puff Inhalation Daily   Continuous Infusions: PRN Meds:.acetaminophen, ALPRAZolam, bisacodyl, dextrose, diclofenac Sodium, ipratropium-albuterol, prochlorperazine, traMADol  Antimicrobials: Anti-infectives (From admission, onward)    Start     Dose/Rate Route Frequency Ordered Stop   09/12/22 2200  sulfamethoxazole-trimethoprim (BACTRIM DS) 800-160 MG per tablet 1 tablet        1 tablet Oral Every 12 hours 09/12/22 1041 09/13/22 2258   09/11/22 0530  sulfamethoxazole-trimethoprim (BACTRIM DS) 800-160 MG per tablet 1 tablet  Status:  Discontinued        1 tablet Oral Every 12 hours 09/11/22 0442 09/12/22 1041        I have personally reviewed the following labs and images: CBC: Recent Labs  Lab 09/10/22 2034 09/11/22 0408 09/12/22 0420 09/14/22 0427  WBC 8.2 6.2 5.6 6.6  HGB 11.9* 10.5* 10.2* 10.5*  HCT 38.3 33.5* 34.3* 34.3*  MCV 83.8 84.4 87.7 85.1  PLT 164 146* 164 164   BMP &GFR Recent Labs  Lab 09/10/22 2034 09/11/22 0408 09/12/22 0420 09/13/22 0356 09/14/22 0427  NA 135 135 136 133* 134*  K 4.4 3.7 4.4 4.3 4.0  CL 98 97* 103 101 103  CO2 25 29 26 24 23   GLUCOSE 44* 119* 164* 238* 195*  BUN 20 18 13 10 9   CREATININE 2.13* 1.92* 1.43* 1.36* 1.30*  CALCIUM 8.8* 8.4* 8.5* 8.5* 8.5*  MG  --  2.4  --   --  2.2  PHOS  --  4.5  --   --  4.0   Estimated Creatinine Clearance: 57 mL/min (A) (by C-G formula based on SCr of 1.3 mg/dL (H)). Liver & Pancreas: Recent Labs  Lab 09/10/22 2034 09/11/22 0408 09/14/22 0427  AST 22 14* 11*  ALT 11 9 11   ALKPHOS 145* 128* 134*  BILITOT 0.9 0.6  0.4  PROT 7.6 6.5 6.2*  ALBUMIN 3.6 3.1* 2.7*   Recent Labs  Lab 09/10/22 2034  LIPASE 19   No results for input(s): "AMMONIA" in the last 168 hours. Diabetic: No results for input(s): "HGBA1C" in the last 72 hours.  Recent Labs  Lab 09/13/22 1202 09/13/22 1650 09/13/22 2007 09/13/22 2303 09/14/22 0717  GLUCAP 220* 450* 301* 238* 211*   Cardiac Enzymes: No results for input(s): "CKTOTAL", "CKMB", "CKMBINDEX", "TROPONINI" in the last 168 hours. No results for input(s): "PROBNP" in the last 8760 hours. Coagulation Profile: No results for input(s): "INR", "PROTIME" in the last 168 hours. Thyroid Function Tests: No results for input(s): "TSH", "T4TOTAL", "FREET4", "T3FREE", "THYROIDAB" in the last 72 hours. Lipid Profile: No results for input(s): "CHOL", "HDL", "LDLCALC", "TRIG", "CHOLHDL", "LDLDIRECT" in the last 72 hours. Anemia Panel: No results for input(s): "VITAMINB12", "FOLATE", "FERRITIN", "TIBC", "IRON", "RETICCTPCT" in the last 72 hours. Urine analysis:    Component Value Date/Time   COLORURINE YELLOW 09/11/2022 0421   APPEARANCEUR CLEAR 09/11/2022 0421   LABSPEC 1.010 09/11/2022 0421   PHURINE 5.0 09/11/2022 0421   GLUCOSEU >=500 (A) 09/11/2022 0421   HGBUR SMALL (A) 09/11/2022 0421   BILIRUBINUR NEGATIVE 09/11/2022 0421   KETONESUR NEGATIVE 09/11/2022 0421   PROTEINUR NEGATIVE 09/11/2022 0421   NITRITE NEGATIVE 09/11/2022 0421   LEUKOCYTESUR SMALL (A) 09/11/2022 0421   Sepsis Labs: Invalid input(s): "PROCALCITONIN", "LACTICIDVEN"  Microbiology: No results found for this or any previous visit (from the past 240 hour(s)).  Radiology Studies: No results found.    Deisha Stull T. Kerrie Latour [facility]  If 7PM-7AM, please contact night-coverage [email or web address] 09/14/2022, 11:31 AM

## 2022-09-14 NOTE — Plan of Care (Signed)
  Problem: Coping: Goal: Ability to adjust to condition or change in health will improve Outcome: Progressing   Problem: Skin Integrity: Goal: Risk for impaired skin integrity will decrease Outcome: Progressing   Problem: Metabolic: Goal: Ability to maintain appropriate glucose levels will improve Outcome: Progressing   Problem: Safety: Goal: Ability to remain free from injury will improve Outcome: Progressing

## 2022-09-15 DIAGNOSIS — K567 Ileus, unspecified: Secondary | ICD-10-CM | POA: Diagnosis not present

## 2022-09-15 DIAGNOSIS — F3160 Bipolar disorder, current episode mixed, unspecified: Secondary | ICD-10-CM | POA: Diagnosis not present

## 2022-09-15 DIAGNOSIS — K5904 Chronic idiopathic constipation: Secondary | ICD-10-CM | POA: Diagnosis not present

## 2022-09-15 DIAGNOSIS — I10 Essential (primary) hypertension: Secondary | ICD-10-CM | POA: Diagnosis not present

## 2022-09-15 LAB — COMPREHENSIVE METABOLIC PANEL
ALT: 10 U/L (ref 0–44)
AST: 8 U/L — ABNORMAL LOW (ref 15–41)
Albumin: 2.6 g/dL — ABNORMAL LOW (ref 3.5–5.0)
Alkaline Phosphatase: 140 U/L — ABNORMAL HIGH (ref 38–126)
Anion gap: 9 (ref 5–15)
BUN: 11 mg/dL (ref 8–23)
CO2: 22 mmol/L (ref 22–32)
Calcium: 8.4 mg/dL — ABNORMAL LOW (ref 8.9–10.3)
Chloride: 105 mmol/L (ref 98–111)
Creatinine, Ser: 1.22 mg/dL — ABNORMAL HIGH (ref 0.44–1.00)
GFR, Estimated: 49 mL/min — ABNORMAL LOW (ref >60)
Glucose, Bld: 189 mg/dL — ABNORMAL HIGH (ref 70–99)
Potassium: 4.3 mmol/L (ref 3.5–5.1)
Sodium: 136 mmol/L (ref 135–145)
Total Bilirubin: 0.5 mg/dL (ref 0.3–1.2)
Total Protein: 6 g/dL — ABNORMAL LOW (ref 6.5–8.1)

## 2022-09-15 LAB — GLUCOSE, CAPILLARY
Glucose-Capillary: 265 mg/dL — ABNORMAL HIGH (ref 70–99)
Glucose-Capillary: 225 mg/dL — ABNORMAL HIGH (ref 70–99)

## 2022-09-15 LAB — CBC
HCT: 34.9 % — ABNORMAL LOW (ref 36.0–46.0)
Hemoglobin: 10.6 g/dL — ABNORMAL LOW (ref 12.0–15.0)
MCH: 26.2 pg (ref 26.0–34.0)
MCHC: 30.4 g/dL (ref 30.0–36.0)
MCV: 86.4 fL (ref 80.0–100.0)
Platelets: 148 10*3/uL — ABNORMAL LOW (ref 150–400)
RBC: 4.04 MIL/uL (ref 3.87–5.11)
RDW: 16 % — ABNORMAL HIGH (ref 11.5–15.5)
WBC: 5.9 10*3/uL (ref 4.0–10.5)
nRBC: 0 % (ref 0.0–0.2)

## 2022-09-15 LAB — PHOSPHORUS: Phosphorus: 4.4 mg/dL (ref 2.5–4.6)

## 2022-09-15 LAB — MAGNESIUM: Magnesium: 2.2 mg/dL (ref 1.7–2.4)

## 2022-09-15 MED ORDER — TORSEMIDE 20 MG PO TABS
20.0000 mg | ORAL_TABLET | Freq: Every day | ORAL | Status: DC
  Administered 2022-09-15: 20 mg via ORAL
  Filled 2022-09-15: qty 1

## 2022-09-15 MED ORDER — INSULIN GLARGINE-YFGN 100 UNIT/ML ~~LOC~~ SOPN: 20.0000 [IU] | PEN_INJECTOR | Freq: Every day | SUBCUTANEOUS | Status: AC

## 2022-09-15 MED ORDER — METOPROLOL SUCCINATE ER 100 MG PO TB24: 100.0000 mg | ORAL_TABLET | Freq: Every day | ORAL | Status: DC

## 2022-09-15 MED ORDER — ORAL CARE MOUTH RINSE: 15.0000 mL | OROMUCOSAL | Status: DC | PRN

## 2022-09-15 MED ORDER — DILTIAZEM HCL ER COATED BEADS 240 MG PO CP24
240.0000 mg | ORAL_CAPSULE | Freq: Every day | ORAL | Status: DC
  Administered 2022-09-15: 240 mg via ORAL
  Filled 2022-09-15: qty 1

## 2022-09-15 MED ORDER — DULERA 200-5 MCG/ACT IN AERO: 2.0000 | INHALATION_SPRAY | Freq: Two times a day (BID) | RESPIRATORY_TRACT | Status: AC

## 2022-09-15 MED ORDER — DILTIAZEM HCL ER COATED BEADS 240 MG PO CP24: 240.0000 mg | ORAL_CAPSULE | Freq: Every day | ORAL | Status: AC

## 2022-09-15 MED ORDER — SENNA 8.6 MG PO TABS: 2.0000 | ORAL_TABLET | Freq: Every day | ORAL | Status: AC

## 2022-09-15 MED ORDER — BISACODYL 10 MG RE SUPP: 10.0000 mg | Freq: Every day | RECTAL | Status: AC | PRN

## 2022-09-15 MED ORDER — ALBUTEROL SULFATE HFA 108 (90 BASE) MCG/ACT IN AERS: 2.0000 | INHALATION_SPRAY | Freq: Four times a day (QID) | RESPIRATORY_TRACT | Status: AC | PRN

## 2022-09-15 NOTE — Progress Notes (Signed)
Occupational Therapy Treatment Patient Details Name: Faith Flores MRN: [id number] DOB: 12-Jan-1955 Today's Date: 09/15/2022   History of present illness [name] is a 67 yr old female admitted to the hospital with L sided abdominal pain and nausea. She was found to have AKI and an ileus and hernia. PMH: abdominal surgeries, hernia repair, chronic diastolic CHF, DM II, HTN, HLD, COPD, OSA, fibromyalgia, CKD III   OT comments  The pt presented with good participation in the session. She performed bed mobility, upper and lower body dressing tasks, sit to stand using a RW, and ambulating a couple steps to the chair using a RW. She did appear to have some shortness of breath with activity; her O2 saturation was 95% on 2L O2 with activity  & heart rate was 65 bpm. Continue OT plan of care.       If plan is discharge home, recommend the following:  Direct supervision/assist for medications management;A little help with bathing/dressing/bathroom   Equipment Recommendations  None recommended by OT    Recommendations for Other Services      Precautions / Restrictions Restrictions Weight Bearing Restrictions: No Other Position/Activity Restrictions: O2 use at baseline       Mobility Bed Mobility Overal bed mobility: Needs Assistance Bed Mobility: Supine to Sit     Supine to sit: Supervision, HOB elevated, Used rails Sit to supine: Used rails, Supervision        Transfers Overall transfer level: Needs assistance Equipment used: Rolling walker (2 wheels) Transfers: Sit to/from Stand Sit to Stand: Contact guard assist           General transfer comment: Once in standing, she ambulated ~ 3 steps to the bedside chair using a RW     Balance          ADL either performed or assessed with clinical judgement   ADL Overall ADL's : Needs assistance/impaired Eating/Feeding: Independent;Sitting Eating/Feeding Details (indicate cue type and reason): based on clinical  judgement Grooming: Set up;Sitting   Upper Body Bathing: Set up;Sitting Upper Body Bathing Details (indicate cue type and reason): based on clinical judgement Lower Body Bathing: Minimal assistance Lower Body Bathing Details (indicate cue type and reason): based on clinical judgement Upper Body Dressing : Set up;Sitting Upper Body Dressing Details (indicate cue type and reason): She donned an overhead shirt seated EOB. Lower Body Dressing: Moderate assistance;Sit to/from stand Lower Body Dressing Details (indicate cue type and reason): The pt required total assist to don her socks seated EOB (limited by B LE edema). She further donned a pair of pants over her ankles and to her knees seated with set-up assist; she then stood using a RW to donn pants over hips, requiring steadying assist in this regard.                     Vision Baseline Vision/History: 1 Wears glasses            Cognition Arousal: Alert Behavior During Therapy: WFL for tasks assessed/performed Overall Cognitive Status: Within Functional Limits for tasks assessed                          Pertinent Vitals/ Pain       Pain Assessment Pain Assessment: No/denies pain         Frequency  Min 1X/week        Progress Toward Goals  OT Goals(current goals can now be found in the care  plan section)  Progress towards OT goals: Progressing toward goals  Acute Rehab OT Goals OT Goal Formulation: With patient Time For Goal Achievement: 09/25/22 Potential to Achieve Goals: Good  Plan         AM-PAC OT "6 Clicks" Daily Activity     Outcome Measure   Help from another person eating meals?: None Help from another person taking care of personal grooming?: A Little Help from another person toileting, which includes using toliet, bedpan, or urinal?: A Little Help from another person bathing (including washing, rinsing, drying)?: A Little Help from another person to put on and taking off regular  upper body clothing?: A Little Help from another person to put on and taking off regular lower body clothing?: A Little 6 Click Score: 19    End of Session Equipment Utilized During Treatment: Rolling walker (2 wheels);Oxygen  OT Visit Diagnosis: Muscle weakness (generalized) (M62.81)   Activity Tolerance Patient tolerated treatment well   Patient Left in chair;with call bell/phone within reach   Nurse Communication Mobility status        Time: 1610-9604 OT Time Calculation (min): 23 min  Charges: OT General Charges $OT Visit: 1 Visit OT Treatments $Self Care/Home Management : 8-22 mins     Reuben Likes, OTR/L 09/15/2022, 12:41 PM

## 2022-09-15 NOTE — TOC Transition Note (Signed)
Transition of Care [facility]) - CM/SW Discharge Note   Patient Details  Name: Faith Flores MRN: [id number] Date of Birth: [birth year]  Transition of Care [facility] CM/SW Contact:  Larrie Kass, LCSW Phone Number: 09/15/2022, 9:14 AM   Clinical Narrative:    Pt to d/c back to [facility]. Room assignment 225A, call report [phone]. CSW spoke with pt's sister [name] and she has agreed to d/c plan. PTAR called , TOC sign off.    Final next level of care: Long Term Nursing Home Barriers to Discharge: No Barriers Identified   Patient Goals and CMS Choice      Discharge Placement                  Patient to be transferred to facility by: ems Name of family member notified: LEONARD,DARLENE (Sister) Patient and family notified of of transfer: 09/15/22  Discharge Plan and Services Additional resources added to the After Visit Summary for                                       Social Determinants of Health (SDOH) Interventions SDOH Screenings   Food Insecurity: No Food Insecurity (09/12/2022)  Housing: Low Risk  (09/12/2022)  Transportation Needs: No Transportation Needs (09/12/2022)  Utilities: Not At Risk (09/12/2022)  Financial Resource Strain: Low Risk  (03/09/2019)   Received from [facility]  Tobacco Use: Medium Risk (09/10/2022)     Readmission Risk Interventions    09/13/2022    1:27 PM  Readmission Risk Prevention Plan  Transportation Screening Complete  PCP or Specialist Appt within 3-5 Days Complete  HRI or Home Care Consult Complete  Social Work Consult for Recovery Care Planning/Counseling Not Complete  SW consult not completed comments No TOC needs at this time

## 2022-09-15 NOTE — Progress Notes (Signed)
[facility] Progress Note     Subjective: CC-  No better, no worse. Continues to have upper/ left sided abdominal pain at hernia. Some nausea, no emesis. Tolerating diet. She ate 100% of dinner. One loose BM yesterday.  Objective: Vital signs in last 24 hours: Temp:  [98.1 F (36.7 C)-99.6 F (37.6 C)] 98.3 F (36.8 C) (09/10 0908) Pulse Rate:  [52-62] 62 (09/10 0908) Resp:  [19-20] 19 (09/09 2039) BP: (129-145)/(47-60) 130/60 (09/10 0908) SpO2:  [93 %-97 %] 96 % (09/10 0908) Weight:  [141.6 kg] 141.6 kg (09/10 0636) Last BM Date : 09/14/22  Intake/Output from previous day: 09/09 0701 - 09/10 0700 In: 460 [P.O.:460] Out: 1300 [Urine:1300] Intake/Output this shift: Total I/O In: 120 [P.O.:120] Out: -   PE: Gen:  Alert, NAD Abd: obese, soft, large left sided ventral hernia is tender but soft and without overlying skin changes  Lab Results:  Recent Labs    09/14/22 0427 09/15/22 0427  WBC 6.6 5.9  HGB 10.5* 10.6*  HCT 34.3* 34.9*  PLT 164 148*   BMET Recent Labs    09/14/22 0427 09/15/22 0427  NA 134* 136  K 4.0 4.3  CL 103 105  CO2 23 22  GLUCOSE 195* 189*  BUN 9 11  CREATININE 1.30* 1.22*  CALCIUM 8.5* 8.4*   PT/INR No results for input(s): "LABPROT", "INR" in the last 72 hours. CMP     Component Value Date/Time   NA 136 09/15/2022 0427   NA 138 12/04/2019 1125   K 4.3 09/15/2022 0427   CL 105 09/15/2022 0427   CO2 22 09/15/2022 0427   GLUCOSE 189 (H) 09/15/2022 0427   BUN 11 09/15/2022 0427   BUN 23 12/04/2019 1125   CREATININE 1.22 (H) 09/15/2022 0427   CALCIUM 8.4 (L) 09/15/2022 0427   PROT 6.0 (L) 09/15/2022 0427   ALBUMIN 2.6 (L) 09/15/2022 0427   AST 8 (L) 09/15/2022 0427   ALT 10 09/15/2022 0427   ALKPHOS 140 (H) 09/15/2022 0427   BILITOT 0.5 09/15/2022 0427   GFRNONAA 49 (L) 09/15/2022 0427   GFRAA 37 (L) 12/04/2019 1125   Lipase     Component Value Date/Time   LIPASE 19 09/10/2022 2034        Studies/Results: No results found.  Anti-infectives: Anti-infectives (From admission, onward)    Start     Dose/Rate Route Frequency Ordered Stop   09/12/22 2200  sulfamethoxazole-trimethoprim (BACTRIM DS) 800-160 MG per tablet 1 tablet        1 tablet Oral Every 12 hours 09/12/22 1041 09/13/22 2258   09/11/22 0530  sulfamethoxazole-trimethoprim (BACTRIM DS) 800-160 MG per tablet 1 tablet  Status:  Discontinued        1 tablet Oral Every 12 hours 09/11/22 0442 09/12/22 1041        Assessment/Plan Ileus vs SBO - patient with hx of constipation and ileus, suspect ileus is more likely given hx and chronic immobility - CT 9/5 with large wide mouth left ventral hernia with mild diffuse gaseous distention of colon with few mildly distended small bowel loops, favor ileus  - Hernia is wide mouthed and do not suspect obstruction related to this. This is not surgically repairable at this point - Tolerating diet and having bowel function. Still with some abdominal pain but overall improved. Continue aggressive bowel regimen at SNF. Mobilize.   FEN: soft diet VTE: LMWH ID: PO bactrim    - per TRH -  T2DM AKI on CKD stage  IIIa Elevated Alk Phos Chronic diastolic CHF HLD HTN COPD Fibromyalgia OSA on CPAP Morbid Obesity - BMI 58.95 Anxiety/depression Chronic constipation   I reviewed hospitalist notes, last 24 h vitals and pain scores, and last 48 h intake and output.    LOS: 5 days    Franne Forts, [facility] 09/15/2022, 9:51 AM Please see Amion for pager number during day hours 7:00am-4:30pm

## 2022-09-15 NOTE — Discharge Summary (Addendum)
Physician Discharge Summary  Faith Flores:[id number] DOB: [birth year] DOA: 09/10/2022  PCP: Patient, No Pcp Per  Admit date: 09/10/2022 Discharge date: 09/15/2022 Admitted From: SNF Disposition: SNF if Recommendations for Outpatient Follow-up:  Aggressive bowel regimen for constipation Reassess blood pressure and heart rate Check CMP and CBC in 1 week Patient has been encouraged to reach out to her company for her CPAP Please follow up on the following pending results: None   Discharge Condition: Stable CODE STATUS: DNR/DNI   Hospital course 67 year old F with PMH of multiple prior abdominal surgeries, ventral hernia, ileus, diastolic CHF, CKD-3A, DM-2, anxiety, depression, OSA not on CPAP and morbid obesity presenting with nausea, constipation, abdominal pain and low glucose, and admitted for ileus versus SBO, AKI and elevated ALP. CT abdomen and pelvis showed ileus versus SBO, large widemouth ventral hernia containing the mesh, and moderate stool burden. General surgery consulted and recommended conservative management with diet adjustment and bowel prep.  As such, patient was initially started on clear liquid diet and aggressive bowel regimen.  Diet advanced slowly and she had bowel movements.  On the day of discharge, she tolerated soft diet and continue to have bowel movements.  She is discharged on aggressive bowel regimen including p.o. Senokot, Dulcolax suppository and home Linzess.   Hospital course complicated by bradycardia and hypotension that has resolved by adjusting meds.  Patient has sleep apnea but not on CPAP.  She has been encouraged to reach out to his CPAP company to obtain one.   See individual problem list below for more.   Problems addressed during this hospitalization Principal Problem:   Ileus (HCC) Active Problems:   BIPOLAR I, MIXED, MOST RECENT EPSD NOS   HYPERTENSION, BENIGN ESSENTIAL   Chronic idiopathic constipation   Diabetic polyneuropathy  associated with type 2 diabetes mellitus (HCC)   Morbid obesity with BMI of 45.0-49.9, adult (HCC)   Pressure injury of skin   Ileus versus small bowel obstruction: Patient with multiple abdominal surgeries and ventral hernia without incarceration.  Has some bowel movement since admission.  Tolerated soft diet. -Continue aggressive bowel regimen   Uncontrolled IDDM-2 with hyperglycemia: A1c 7.3%. -Decrease Semglee from 36 units twice daily to 20 units daily based on her CBG and insulin requirement. -Continue home NovoLog -Further adjustment as appropriate.   AKI on CKD-3A: Baseline Cr ~1.1.  Resolved. -Recheck in 1 week   Sinus bradycardia/hypotension: Resolved. -Stop Toprol-XL.  Patient reports history of COPD/asthma although not on inhalers.  -Changed p.o. Cardizem to Cardizem CD 240 mg daily -Reassess and adjust as appropriate.   Isolated elevated alkaline phosphatase: Mild and doubt relevance..   Chronic constipation: -Aggressive bowel regimen   Nausea without vomiting/abdominal pain: Likely due to the above.  Improved. -Antiemetics and bowel regimen   Chronic diastolic heart failure: TTE in 2021 with LVEF of 55 to 60%, G2 DD, normal RVSP.  Stable but difficult to assess below to tolerate due to body habitus. -Continue home diuretics   Chronic anxiety/depression: -Continue home meds   Hyperlipidemia:  -Continue statins   OSA/possible OHS not on CPAP. -Encouraged to call the CPAP company to obtain one   Cough and wheezing: Reports history of COPD.she states she had 30-pack-year history of smoking in the past.  She reports some improvement with DuoNebs. -Started Dulera and albuterol.   Right third toe cellulitis: Completed antibiotic course with p.o. Bactrim  Addendum Neurogenic bladder/Chronic foley -Continue foley catheter   Morbid obesity Body mass index is 58.98 kg/m. -  Encourage lifestyle change to lose weight   Pressure skin injury: POA Pressure Injury  09/11/22 Thigh Left;Posterior;Proximal Stage 2 -  Partial thickness loss of dermis presenting as a shallow open injury with a red, pink wound bed without slough. (Active)  09/11/22 0059  Location: Thigh  Location Orientation: Left;Posterior;Proximal  Staging: Stage 2 -  Partial thickness loss of dermis presenting as a shallow open injury with a red, pink wound bed without slough.  Wound Description (Comments):   Present on Admission: Yes  Dressing Type Foam - Lift dressing to assess site every shift 09/15/22 0908    Time spent 45 minutes  Vital signs Vitals:   09/15/22 0636 09/15/22 0822 09/15/22 0908 09/15/22 1111  BP: (!) 145/55  130/60 (!) 133/51  Pulse:   62 64  Temp: 98.2 F (36.8 C)  98.3 F (36.8 C) 98.5 F (36.9 C)  Resp:      Height:      Weight: (!) 141.6 kg     SpO2: 96% 97% 96% 95%  TempSrc: Oral  Oral Oral  BMI (Calculated): 59.01        Discharge exam  GENERAL: No apparent distress.  Nontoxic. HEENT: MMM.  Vision and hearing grossly intact.  NECK: Supple.  No apparent JVD.  RESP:  No IWOB.  Fair aeration bilaterally but limited. CVS:  RRR. Heart sounds normal.  ABD/GI/GU: BS+. Abd soft.  Diffuse tenderness. MSK/EXT:  Moves extremities. No apparent deformity.  Lymphedema. SKIN: no apparent skin lesion or wound NEURO: Awake and alert. Oriented appropriately.  No apparent focal neuro deficit. PSYCH: Calm. Normal affect.   Discharge Instructions Discharge Instructions     Diet - low sodium heart healthy   Complete by: As directed    Diet Carb Modified   Complete by: As directed    Discharge wound care:   Complete by: As directed    Wound care  Daily      Comments: Clean L ischial PI with NS, apply silicone foam dressing. Apply Gerhardt's Butt Cream to surrounding skin as well as skin of buttocks 2 times daily and prn soiling.  Lift foam daily to assess area.  Change foam q3 days and prn soiling.   Increase activity slowly   Complete by: As directed        Allergies as of 09/15/2022       Reactions   Ketamine Other (See Comments)   Hallucinations   Pregabalin Swelling      Zolpidem Other (See Comments)   Out of sorts/not in control   Doxycycline Other (See Comments)   Reaction unknown; listed on MAR   Erythromycin Other (See Comments)   Reaction unknown   Metoclopramide Other (See Comments)   Tardive diskinesia   Miconazole Other (See Comments)   Reaction unknown   Morphine Other (See Comments)   Reaction unknown; listed on MAR   Penicillins Other (See Comments)   Reaction unknown; listed on MAR   Pseudoephedrine Hcl Other (See Comments)   Reaction unknown   Tetracaine Other (See Comments)   Reaction unknown   Tetracycline Other (See Comments)   Reaction unknown   Tuberculin Tests Other (See Comments)   Reaction unknown; listed on MAR   Clotrimazole Rash   Dopamine Other (See Comments)   Reaction unknown   Loratadine Other (See Comments)   Urinary retention   Mupirocin Swelling        Medication List     STOP taking these medications    diltiazem 30  MG tablet Commonly known as: CARDIZEM   metoprolol 200 MG 24 hr tablet Commonly known as: TOPROL-XL   potassium chloride SA 20 MEQ tablet Commonly known as: KLOR-CON M   sulfamethoxazole-trimethoprim 800-160 MG tablet Commonly known as: BACTRIM DS   zaleplon 10 MG capsule Commonly known as: SONATA       TAKE these medications    albuterol 108 (90 Base) MCG/ACT inhaler Commonly known as: VENTOLIN HFA Inhale 2 puffs into the lungs every 6 (six) hours as needed for wheezing or shortness of breath.   ALPRAZolam 0.5 MG tablet Commonly known as: XANAX Take 0.5 mg by mouth daily.   ARIPiprazole 5 MG tablet Commonly known as: ABILIFY Take 5 mg by mouth daily.   ascorbic acid 1000 MG tablet Commonly known as: VITAMIN C Take 1,000 mg by mouth daily.   aspirin EC 81 MG tablet Take 81 mg by mouth daily. Swallow whole.   atorvastatin 40 MG  tablet Commonly known as: LIPITOR Take 40 mg by mouth daily.   bisacodyl 10 MG suppository Commonly known as: DULCOLAX Place 1 suppository (10 mg total) rectally daily as needed for moderate constipation.   diclofenac Sodium 1 % Gel Commonly known as: VOLTAREN Apply 2 g topically in the morning and at bedtime. Apply to right shoulder What changed: Another medication with the same name was removed. Continue taking this medication, and follow the directions you see here.   diltiazem 240 MG 24 hr capsule Commonly known as: CARDIZEM CD Take 1 capsule (240 mg total) by mouth daily.   Dulera 200-5 MCG/ACT Aero Generic drug: mometasone-formoterol Inhale 2 puffs into the lungs in the morning and at bedtime.   empagliflozin 25 MG Tabs tablet Commonly known as: JARDIANCE Take 25 mg by mouth in the morning.   gabapentin 600 MG tablet Commonly known as: NEURONTIN Take 600 mg by mouth every 8 (eight) hours.   insulin glargine-yfgn 100 UNIT/ML Pen Commonly known as: SEMGLEE Inject 20 Units into the skin daily. What changed:  how much to take when to take this   Insulin Regular Human 100 UNIT/ML KwikPen Commonly known as: HUMULIN R Inject 0-26 Units into the skin See admin instructions. Inject 0-26 units subcutaneously three times daily before meals. Inject as per sliding scale: Below 70 = Call MD BG 0-150 = 0 units BG 151-200 = 6 units BG 201-250 = 10 units BG 251-300 = 14 units BG 301-350 = 18 units BG 351-400 = 22 units BG 401+ = 26 units   Linzess 290 MCG Caps capsule Generic drug: linaclotide Take 290 mcg by mouth See admin instructions. Give one capsule by mouth one time a day every other day for constipation   magnesium oxide 400 MG tablet Commonly known as: MAG-OX Take 400 mg by mouth daily.   melatonin 5 MG Tabs Take 5 mg by mouth at bedtime.   QUEtiapine 200 MG tablet Commonly known as: SEROQUEL Take 200-500 mg by mouth See admin instructions. Give one tablet  by mouth in the morning and 2 and a half tablets at bed time   senna 8.6 MG Tabs tablet Commonly known as: SENOKOT Take 2 tablets (17.2 mg total) by mouth daily.   sertraline 100 MG tablet Commonly known as: ZOLOFT Take 200 mg by mouth daily.   spironolactone 25 MG tablet Commonly known as: ALDACTONE Take 25 mg by mouth daily.   tiZANidine 2 MG tablet Commonly known as: ZANAFLEX Take 2 mg by mouth every 8 (eight) hours.  torsemide 20 MG tablet Commonly known as: Demadex Take 1 tablet (20 mg total) by mouth daily. What changed:  how much to take when to take this   traMADol 50 MG tablet Commonly known as: ULTRAM Take 50 mg by mouth at bedtime. For pain   Vitamin D3 50 MCG (2000 UT) Tabs Take 2,000 Units by mouth daily.               Discharge Care Instructions  (From admission, onward)           Start     Ordered   09/15/22 0000  Discharge wound care:       Comments: Wound care  Daily      Comments: Clean L ischial PI with NS, apply silicone foam dressing. Apply Gerhardt's Butt Cream to surrounding skin as well as skin of buttocks 2 times daily and prn soiling.  Lift foam daily to assess area.  Change foam q3 days and prn soiling.   09/15/22 0822            Consultations: General surgery  Procedures/Studies:   CT ABDOMEN PELVIS WO CONTRAST  Result Date: 09/10/2022 CLINICAL DATA:  Bowel obstruction suspected. Left side abdominal pain. EXAM: CT ABDOMEN AND PELVIS WITHOUT CONTRAST TECHNIQUE: Multidetector CT imaging of the abdomen and pelvis was performed following the standard protocol without IV contrast. RADIATION DOSE REDUCTION: This exam was performed according to the departmental dose-optimization program which includes automated exposure control, adjustment of the mA and/or kV according to patient size and/or use of iterative reconstruction technique. COMPARISON:  05/08/2020 FINDINGS: Lower chest: No acute abnormality Hepatobiliary: No focal liver  abnormality is seen. Status post cholecystectomy. Common bile duct mildly dilated at 12 mm compared to 18 mm previously. This is likely related to post cholecystectomy state. Pancreas: No focal abnormality or ductal dilatation. Spleen: No focal abnormality.  Normal size. Adrenals/Urinary Tract: No adrenal abnormality. No focal renal abnormality. No stones or hydronephrosis. Foley catheter in place with bladder decompressed. Stomach/Bowel: Laxity of anterior abdominal wall. There appears to been prior ventral hernia repair, but there is a large wide-mouth ventral hernia repair repair containing much of the large and small bowel. There are a few mildly dilated loops of upper abdominal small bowel, likely jejunal loops with air-fluid levels. Mild gaseous distention of the colon as well. Favor ileus over obstruction. Moderate stool burden. Changes of gastric sleeve. Vascular/Lymphatic: No evidence of aneurysm or adenopathy. Aortic atherosclerosis. Reproductive: Prior hysterectomy.  No adnexal masses. Other: No free fluid or free air. Musculoskeletal: No acute bony abnormality. IMPRESSION: Large wide-mouth left ventral hernia containing much of the large and small bowel. There is mild diffuse gaseous distention of the colon with a few mildly distended jejunal small bowel loops. Favor ileus. Prior gastric sleeve. Mild common bile duct dilatation, likely related to post cholecystectomy state. This is decreased since prior study. Aortic atherosclerosis. Electronically Signed   By: Charlett Nose M.D.   On: 09/10/2022 22:32       The results of significant diagnostics from this hospitalization (including imaging, microbiology, ancillary and laboratory) are listed below for reference.     Microbiology: No results found for this or any previous visit (from the past 240 hour(s)).   Labs:  CBC: Recent Labs  Lab 09/10/22 2034 09/11/22 0408 09/12/22 0420 09/14/22 0427 09/15/22 0427  WBC 8.2 6.2 5.6 6.6 5.9  HGB  11.9* 10.5* 10.2* 10.5* 10.6*  HCT 38.3 33.5* 34.3* 34.3* 34.9*  MCV 83.8 84.4 87.7  85.1 86.4  PLT 164 146* 164 164 148*   BMP &GFR Recent Labs  Lab 09/11/22 0408 09/12/22 0420 09/13/22 0356 09/14/22 0427 09/15/22 0427  NA 135 136 133* 134* 136  K 3.7 4.4 4.3 4.0 4.3  CL 97* 103 101 103 105  CO2 29 26 24 23 22   GLUCOSE 119* 164* 238* 195* 189*  BUN 18 13 10 9 11   CREATININE 1.92* 1.43* 1.36* 1.30* 1.22*  CALCIUM 8.4* 8.5* 8.5* 8.5* 8.4*  MG 2.4  --   --  2.2 2.2  PHOS 4.5  --   --  4.0 4.4   Estimated Creatinine Clearance: 61.1 mL/min (A) (by C-G formula based on SCr of 1.22 mg/dL (H)). Liver & Pancreas: Recent Labs  Lab 09/10/22 2034 09/11/22 0408 09/14/22 0427 09/15/22 0427  AST 22 14* 11* 8*  ALT 11 9 11 10   ALKPHOS 145* 128* 134* 140*  BILITOT 0.9 0.6 0.4 0.5  PROT 7.6 6.5 6.2* 6.0*  ALBUMIN 3.6 3.1* 2.7* 2.6*   Recent Labs  Lab 09/10/22 2034  LIPASE 19   No results for input(s): "AMMONIA" in the last 168 hours. Diabetic: No results for input(s): "HGBA1C" in the last 72 hours. Recent Labs  Lab 09/14/22 0717 09/14/22 1156 09/14/22 1724 09/14/22 2033 09/15/22 0810  GLUCAP 211* 208* 265* 238* 265*   Cardiac Enzymes: No results for input(s): "CKTOTAL", "CKMB", "CKMBINDEX", "TROPONINI" in the last 168 hours. No results for input(s): "PROBNP" in the last 8760 hours. Coagulation Profile: No results for input(s): "INR", "PROTIME" in the last 168 hours. Thyroid Function Tests: No results for input(s): "TSH", "T4TOTAL", "FREET4", "T3FREE", "THYROIDAB" in the last 72 hours. Lipid Profile: No results for input(s): "CHOL", "HDL", "LDLCALC", "TRIG", "CHOLHDL", "LDLDIRECT" in the last 72 hours. Anemia Panel: No results for input(s): "VITAMINB12", "FOLATE", "FERRITIN", "TIBC", "IRON", "RETICCTPCT" in the last 72 hours. Urine analysis:    Component Value Date/Time   COLORURINE YELLOW 09/11/2022 0421   APPEARANCEUR CLEAR 09/11/2022 0421   LABSPEC 1.010  09/11/2022 0421   PHURINE 5.0 09/11/2022 0421   GLUCOSEU >=500 (A) 09/11/2022 0421   HGBUR SMALL (A) 09/11/2022 0421   BILIRUBINUR NEGATIVE 09/11/2022 0421   KETONESUR NEGATIVE 09/11/2022 0421   PROTEINUR NEGATIVE 09/11/2022 0421   NITRITE NEGATIVE 09/11/2022 0421   LEUKOCYTESUR SMALL (A) 09/11/2022 0421   Sepsis Labs: Invalid input(s): "PROCALCITONIN", "LACTICIDVEN"   SIGNED:  Almon Hercules, MD  [facility] 09/15/2022, 11:29 AM

## 2022-09-15 NOTE — Plan of Care (Signed)
  Problem: Skin Integrity: Goal: Risk for impaired skin integrity will decrease 09/15/2022 0623 by Thea Gist, RN Outcome: Progressing 09/15/2022 0622 by Thea Gist, RN Outcome: Progressing   Problem: Safety: Goal: Ability to remain free from injury will improve 09/15/2022 0623 by Thea Gist, RN Outcome: Progressing 09/15/2022 0622 by Thea Gist, RN Outcome: Progressing   Problem: Pain Managment: Goal: General experience of comfort will improve 09/15/2022 0623 by Thea Gist, RN Outcome: Progressing 09/15/2022 0622 by Thea Gist, RN Outcome: Progressing   Problem: Elimination: Goal: Will not experience complications related to bowel motility 09/15/2022 0623 by Thea Gist, RN Outcome: Progressing 09/15/2022 0622 by Thea Gist, RN Outcome: Progressing

## 2022-09-16 DIAGNOSIS — G629 Polyneuropathy, unspecified: Secondary | ICD-10-CM | POA: Diagnosis not present

## 2022-09-16 DIAGNOSIS — M6281 Muscle weakness (generalized): Secondary | ICD-10-CM | POA: Diagnosis not present

## 2022-09-16 DIAGNOSIS — Z741 Need for assistance with personal care: Secondary | ICD-10-CM | POA: Diagnosis not present

## 2022-09-16 DIAGNOSIS — R278 Other lack of coordination: Secondary | ICD-10-CM | POA: Diagnosis not present

## 2022-09-16 DIAGNOSIS — I5040 Unspecified combined systolic (congestive) and diastolic (congestive) heart failure: Secondary | ICD-10-CM | POA: Diagnosis not present

## 2022-09-16 DIAGNOSIS — I480 Paroxysmal atrial fibrillation: Secondary | ICD-10-CM | POA: Diagnosis not present

## 2022-09-16 DIAGNOSIS — M47816 Spondylosis without myelopathy or radiculopathy, lumbar region: Secondary | ICD-10-CM | POA: Diagnosis not present

## 2022-09-16 DIAGNOSIS — R2689 Other abnormalities of gait and mobility: Secondary | ICD-10-CM | POA: Diagnosis not present

## 2022-09-16 DIAGNOSIS — K567 Ileus, unspecified: Secondary | ICD-10-CM | POA: Diagnosis not present

## 2022-09-16 DIAGNOSIS — E0840 Diabetes mellitus due to underlying condition with diabetic neuropathy, unspecified: Secondary | ICD-10-CM | POA: Diagnosis not present

## 2022-09-17 DIAGNOSIS — K567 Ileus, unspecified: Secondary | ICD-10-CM | POA: Diagnosis not present

## 2022-09-17 DIAGNOSIS — I5043 Acute on chronic combined systolic (congestive) and diastolic (congestive) heart failure: Secondary | ICD-10-CM | POA: Diagnosis not present

## 2022-09-17 DIAGNOSIS — K5909 Other constipation: Secondary | ICD-10-CM | POA: Diagnosis not present

## 2022-09-17 DIAGNOSIS — Z741 Need for assistance with personal care: Secondary | ICD-10-CM | POA: Diagnosis not present

## 2022-09-17 DIAGNOSIS — I5040 Unspecified combined systolic (congestive) and diastolic (congestive) heart failure: Secondary | ICD-10-CM | POA: Diagnosis not present

## 2022-09-17 DIAGNOSIS — Z79899 Other long term (current) drug therapy: Secondary | ICD-10-CM | POA: Diagnosis not present

## 2022-09-17 DIAGNOSIS — I10 Essential (primary) hypertension: Secondary | ICD-10-CM | POA: Diagnosis not present

## 2022-09-17 DIAGNOSIS — M47816 Spondylosis without myelopathy or radiculopathy, lumbar region: Secondary | ICD-10-CM | POA: Diagnosis not present

## 2022-09-17 DIAGNOSIS — E0840 Diabetes mellitus due to underlying condition with diabetic neuropathy, unspecified: Secondary | ICD-10-CM | POA: Diagnosis not present

## 2022-09-17 DIAGNOSIS — M6281 Muscle weakness (generalized): Secondary | ICD-10-CM | POA: Diagnosis not present

## 2022-09-17 DIAGNOSIS — R278 Other lack of coordination: Secondary | ICD-10-CM | POA: Diagnosis not present

## 2022-09-17 DIAGNOSIS — R2689 Other abnormalities of gait and mobility: Secondary | ICD-10-CM | POA: Diagnosis not present

## 2022-09-18 DIAGNOSIS — R2689 Other abnormalities of gait and mobility: Secondary | ICD-10-CM | POA: Diagnosis not present

## 2022-09-18 DIAGNOSIS — R278 Other lack of coordination: Secondary | ICD-10-CM | POA: Diagnosis not present

## 2022-09-18 DIAGNOSIS — K430 Incisional hernia with obstruction, without gangrene: Secondary | ICD-10-CM | POA: Diagnosis not present

## 2022-09-18 DIAGNOSIS — M47816 Spondylosis without myelopathy or radiculopathy, lumbar region: Secondary | ICD-10-CM | POA: Diagnosis not present

## 2022-09-18 DIAGNOSIS — K5909 Other constipation: Secondary | ICD-10-CM | POA: Diagnosis not present

## 2022-09-18 DIAGNOSIS — M6281 Muscle weakness (generalized): Secondary | ICD-10-CM | POA: Diagnosis not present

## 2022-09-18 DIAGNOSIS — K567 Ileus, unspecified: Secondary | ICD-10-CM | POA: Diagnosis not present

## 2022-09-18 DIAGNOSIS — Z741 Need for assistance with personal care: Secondary | ICD-10-CM | POA: Diagnosis not present

## 2022-09-18 DIAGNOSIS — E0840 Diabetes mellitus due to underlying condition with diabetic neuropathy, unspecified: Secondary | ICD-10-CM | POA: Diagnosis not present

## 2022-09-18 DIAGNOSIS — I5040 Unspecified combined systolic (congestive) and diastolic (congestive) heart failure: Secondary | ICD-10-CM | POA: Diagnosis not present

## 2022-09-19 DIAGNOSIS — Z741 Need for assistance with personal care: Secondary | ICD-10-CM | POA: Diagnosis not present

## 2022-09-19 DIAGNOSIS — M6281 Muscle weakness (generalized): Secondary | ICD-10-CM | POA: Diagnosis not present

## 2022-09-19 DIAGNOSIS — K567 Ileus, unspecified: Secondary | ICD-10-CM | POA: Diagnosis not present

## 2022-09-19 DIAGNOSIS — R278 Other lack of coordination: Secondary | ICD-10-CM | POA: Diagnosis not present

## 2022-09-19 DIAGNOSIS — I5040 Unspecified combined systolic (congestive) and diastolic (congestive) heart failure: Secondary | ICD-10-CM | POA: Diagnosis not present

## 2022-09-19 DIAGNOSIS — E0840 Diabetes mellitus due to underlying condition with diabetic neuropathy, unspecified: Secondary | ICD-10-CM | POA: Diagnosis not present

## 2022-09-19 DIAGNOSIS — R2689 Other abnormalities of gait and mobility: Secondary | ICD-10-CM | POA: Diagnosis not present

## 2022-09-19 DIAGNOSIS — M47816 Spondylosis without myelopathy or radiculopathy, lumbar region: Secondary | ICD-10-CM | POA: Diagnosis not present

## 2022-09-21 DIAGNOSIS — Z741 Need for assistance with personal care: Secondary | ICD-10-CM | POA: Diagnosis not present

## 2022-09-21 DIAGNOSIS — M6281 Muscle weakness (generalized): Secondary | ICD-10-CM | POA: Diagnosis not present

## 2022-09-21 DIAGNOSIS — R2689 Other abnormalities of gait and mobility: Secondary | ICD-10-CM | POA: Diagnosis not present

## 2022-09-21 DIAGNOSIS — R278 Other lack of coordination: Secondary | ICD-10-CM | POA: Diagnosis not present

## 2022-09-21 DIAGNOSIS — M47816 Spondylosis without myelopathy or radiculopathy, lumbar region: Secondary | ICD-10-CM | POA: Diagnosis not present

## 2022-09-21 DIAGNOSIS — I5040 Unspecified combined systolic (congestive) and diastolic (congestive) heart failure: Secondary | ICD-10-CM | POA: Diagnosis not present

## 2022-09-21 DIAGNOSIS — K567 Ileus, unspecified: Secondary | ICD-10-CM | POA: Diagnosis not present

## 2022-09-21 DIAGNOSIS — E0840 Diabetes mellitus due to underlying condition with diabetic neuropathy, unspecified: Secondary | ICD-10-CM | POA: Diagnosis not present

## 2022-09-22 ENCOUNTER — Telehealth: Payer: Self-pay | Admitting: Internal Medicine

## 2022-09-22 DIAGNOSIS — M6281 Muscle weakness (generalized): Secondary | ICD-10-CM | POA: Diagnosis not present

## 2022-09-22 DIAGNOSIS — I5040 Unspecified combined systolic (congestive) and diastolic (congestive) heart failure: Secondary | ICD-10-CM | POA: Diagnosis not present

## 2022-09-22 DIAGNOSIS — M47816 Spondylosis without myelopathy or radiculopathy, lumbar region: Secondary | ICD-10-CM | POA: Diagnosis not present

## 2022-09-22 DIAGNOSIS — R278 Other lack of coordination: Secondary | ICD-10-CM | POA: Diagnosis not present

## 2022-09-22 DIAGNOSIS — Z741 Need for assistance with personal care: Secondary | ICD-10-CM | POA: Diagnosis not present

## 2022-09-22 DIAGNOSIS — R2689 Other abnormalities of gait and mobility: Secondary | ICD-10-CM | POA: Diagnosis not present

## 2022-09-22 DIAGNOSIS — K567 Ileus, unspecified: Secondary | ICD-10-CM | POA: Diagnosis not present

## 2022-09-22 DIAGNOSIS — E0840 Diabetes mellitus due to underlying condition with diabetic neuropathy, unspecified: Secondary | ICD-10-CM | POA: Diagnosis not present

## 2022-09-22 NOTE — Telephone Encounter (Signed)
Temicka from [facility] is calling on behalf of her resident Lequita Giacobbe. She had a sleep study done but they have not received the results. It can be faxed to them and brought to the attention to [facility]. Fax number [phone]

## 2022-09-23 DIAGNOSIS — R2689 Other abnormalities of gait and mobility: Secondary | ICD-10-CM | POA: Diagnosis not present

## 2022-09-23 DIAGNOSIS — Z741 Need for assistance with personal care: Secondary | ICD-10-CM | POA: Diagnosis not present

## 2022-09-23 DIAGNOSIS — M47816 Spondylosis without myelopathy or radiculopathy, lumbar region: Secondary | ICD-10-CM | POA: Diagnosis not present

## 2022-09-23 DIAGNOSIS — K567 Ileus, unspecified: Secondary | ICD-10-CM | POA: Diagnosis not present

## 2022-09-23 DIAGNOSIS — I509 Heart failure, unspecified: Secondary | ICD-10-CM | POA: Diagnosis not present

## 2022-09-23 DIAGNOSIS — E785 Hyperlipidemia, unspecified: Secondary | ICD-10-CM | POA: Diagnosis not present

## 2022-09-23 DIAGNOSIS — I5040 Unspecified combined systolic (congestive) and diastolic (congestive) heart failure: Secondary | ICD-10-CM | POA: Diagnosis not present

## 2022-09-23 DIAGNOSIS — R278 Other lack of coordination: Secondary | ICD-10-CM | POA: Diagnosis not present

## 2022-09-23 DIAGNOSIS — E0840 Diabetes mellitus due to underlying condition with diabetic neuropathy, unspecified: Secondary | ICD-10-CM | POA: Diagnosis not present

## 2022-09-23 DIAGNOSIS — E119 Type 2 diabetes mellitus without complications: Secondary | ICD-10-CM | POA: Diagnosis not present

## 2022-09-23 DIAGNOSIS — I10 Essential (primary) hypertension: Secondary | ICD-10-CM | POA: Diagnosis not present

## 2022-09-23 DIAGNOSIS — M6281 Muscle weakness (generalized): Secondary | ICD-10-CM | POA: Diagnosis not present

## 2022-09-24 DIAGNOSIS — K567 Ileus, unspecified: Secondary | ICD-10-CM | POA: Diagnosis not present

## 2022-09-24 DIAGNOSIS — E0840 Diabetes mellitus due to underlying condition with diabetic neuropathy, unspecified: Secondary | ICD-10-CM | POA: Diagnosis not present

## 2022-09-24 DIAGNOSIS — R278 Other lack of coordination: Secondary | ICD-10-CM | POA: Diagnosis not present

## 2022-09-24 DIAGNOSIS — R2689 Other abnormalities of gait and mobility: Secondary | ICD-10-CM | POA: Diagnosis not present

## 2022-09-24 DIAGNOSIS — Z741 Need for assistance with personal care: Secondary | ICD-10-CM | POA: Diagnosis not present

## 2022-09-24 DIAGNOSIS — M6281 Muscle weakness (generalized): Secondary | ICD-10-CM | POA: Diagnosis not present

## 2022-09-24 DIAGNOSIS — I5040 Unspecified combined systolic (congestive) and diastolic (congestive) heart failure: Secondary | ICD-10-CM | POA: Diagnosis not present

## 2022-09-24 DIAGNOSIS — M47816 Spondylosis without myelopathy or radiculopathy, lumbar region: Secondary | ICD-10-CM | POA: Diagnosis not present

## 2022-09-25 DIAGNOSIS — R278 Other lack of coordination: Secondary | ICD-10-CM | POA: Diagnosis not present

## 2022-09-25 DIAGNOSIS — Z741 Need for assistance with personal care: Secondary | ICD-10-CM | POA: Diagnosis not present

## 2022-09-25 DIAGNOSIS — E0840 Diabetes mellitus due to underlying condition with diabetic neuropathy, unspecified: Secondary | ICD-10-CM | POA: Diagnosis not present

## 2022-09-25 DIAGNOSIS — M47816 Spondylosis without myelopathy or radiculopathy, lumbar region: Secondary | ICD-10-CM | POA: Diagnosis not present

## 2022-09-25 DIAGNOSIS — K567 Ileus, unspecified: Secondary | ICD-10-CM | POA: Diagnosis not present

## 2022-09-25 DIAGNOSIS — R2689 Other abnormalities of gait and mobility: Secondary | ICD-10-CM | POA: Diagnosis not present

## 2022-09-25 DIAGNOSIS — M25511 Pain in right shoulder: Secondary | ICD-10-CM | POA: Diagnosis not present

## 2022-09-25 DIAGNOSIS — I5040 Unspecified combined systolic (congestive) and diastolic (congestive) heart failure: Secondary | ICD-10-CM | POA: Diagnosis not present

## 2022-09-25 DIAGNOSIS — M6281 Muscle weakness (generalized): Secondary | ICD-10-CM | POA: Diagnosis not present

## 2022-09-26 DIAGNOSIS — E559 Vitamin D deficiency, unspecified: Secondary | ICD-10-CM | POA: Diagnosis not present

## 2022-09-26 DIAGNOSIS — I10 Essential (primary) hypertension: Secondary | ICD-10-CM | POA: Diagnosis not present

## 2022-09-26 DIAGNOSIS — I501 Left ventricular failure: Secondary | ICD-10-CM | POA: Diagnosis not present

## 2022-09-26 DIAGNOSIS — E119 Type 2 diabetes mellitus without complications: Secondary | ICD-10-CM | POA: Diagnosis not present

## 2022-09-27 DIAGNOSIS — E0840 Diabetes mellitus due to underlying condition with diabetic neuropathy, unspecified: Secondary | ICD-10-CM | POA: Diagnosis not present

## 2022-09-27 DIAGNOSIS — K567 Ileus, unspecified: Secondary | ICD-10-CM | POA: Diagnosis not present

## 2022-09-27 DIAGNOSIS — Z741 Need for assistance with personal care: Secondary | ICD-10-CM | POA: Diagnosis not present

## 2022-09-27 DIAGNOSIS — I5040 Unspecified combined systolic (congestive) and diastolic (congestive) heart failure: Secondary | ICD-10-CM | POA: Diagnosis not present

## 2022-09-27 DIAGNOSIS — R278 Other lack of coordination: Secondary | ICD-10-CM | POA: Diagnosis not present

## 2022-09-27 DIAGNOSIS — R2689 Other abnormalities of gait and mobility: Secondary | ICD-10-CM | POA: Diagnosis not present

## 2022-09-27 DIAGNOSIS — M47816 Spondylosis without myelopathy or radiculopathy, lumbar region: Secondary | ICD-10-CM | POA: Diagnosis not present

## 2022-09-27 DIAGNOSIS — M6281 Muscle weakness (generalized): Secondary | ICD-10-CM | POA: Diagnosis not present

## 2022-09-28 DIAGNOSIS — E0840 Diabetes mellitus due to underlying condition with diabetic neuropathy, unspecified: Secondary | ICD-10-CM | POA: Diagnosis not present

## 2022-09-28 DIAGNOSIS — R2689 Other abnormalities of gait and mobility: Secondary | ICD-10-CM | POA: Diagnosis not present

## 2022-09-28 DIAGNOSIS — K567 Ileus, unspecified: Secondary | ICD-10-CM | POA: Diagnosis not present

## 2022-09-28 DIAGNOSIS — I5040 Unspecified combined systolic (congestive) and diastolic (congestive) heart failure: Secondary | ICD-10-CM | POA: Diagnosis not present

## 2022-09-28 DIAGNOSIS — Z741 Need for assistance with personal care: Secondary | ICD-10-CM | POA: Diagnosis not present

## 2022-09-28 DIAGNOSIS — M6281 Muscle weakness (generalized): Secondary | ICD-10-CM | POA: Diagnosis not present

## 2022-09-28 DIAGNOSIS — M47816 Spondylosis without myelopathy or radiculopathy, lumbar region: Secondary | ICD-10-CM | POA: Diagnosis not present

## 2022-09-28 DIAGNOSIS — R278 Other lack of coordination: Secondary | ICD-10-CM | POA: Diagnosis not present

## 2022-09-29 DIAGNOSIS — M47816 Spondylosis without myelopathy or radiculopathy, lumbar region: Secondary | ICD-10-CM | POA: Diagnosis not present

## 2022-09-29 DIAGNOSIS — E0840 Diabetes mellitus due to underlying condition with diabetic neuropathy, unspecified: Secondary | ICD-10-CM | POA: Diagnosis not present

## 2022-09-29 DIAGNOSIS — R2689 Other abnormalities of gait and mobility: Secondary | ICD-10-CM | POA: Diagnosis not present

## 2022-09-29 DIAGNOSIS — R278 Other lack of coordination: Secondary | ICD-10-CM | POA: Diagnosis not present

## 2022-09-29 DIAGNOSIS — Z741 Need for assistance with personal care: Secondary | ICD-10-CM | POA: Diagnosis not present

## 2022-09-29 DIAGNOSIS — I5040 Unspecified combined systolic (congestive) and diastolic (congestive) heart failure: Secondary | ICD-10-CM | POA: Diagnosis not present

## 2022-09-29 DIAGNOSIS — K567 Ileus, unspecified: Secondary | ICD-10-CM | POA: Diagnosis not present

## 2022-09-29 DIAGNOSIS — M6281 Muscle weakness (generalized): Secondary | ICD-10-CM | POA: Diagnosis not present

## 2022-09-30 DIAGNOSIS — M47816 Spondylosis without myelopathy or radiculopathy, lumbar region: Secondary | ICD-10-CM | POA: Diagnosis not present

## 2022-09-30 DIAGNOSIS — I5040 Unspecified combined systolic (congestive) and diastolic (congestive) heart failure: Secondary | ICD-10-CM | POA: Diagnosis not present

## 2022-09-30 DIAGNOSIS — Z741 Need for assistance with personal care: Secondary | ICD-10-CM | POA: Diagnosis not present

## 2022-09-30 DIAGNOSIS — F411 Generalized anxiety disorder: Secondary | ICD-10-CM | POA: Diagnosis not present

## 2022-09-30 DIAGNOSIS — M6281 Muscle weakness (generalized): Secondary | ICD-10-CM | POA: Diagnosis not present

## 2022-09-30 DIAGNOSIS — R278 Other lack of coordination: Secondary | ICD-10-CM | POA: Diagnosis not present

## 2022-09-30 DIAGNOSIS — K567 Ileus, unspecified: Secondary | ICD-10-CM | POA: Diagnosis not present

## 2022-09-30 DIAGNOSIS — E0840 Diabetes mellitus due to underlying condition with diabetic neuropathy, unspecified: Secondary | ICD-10-CM | POA: Diagnosis not present

## 2022-09-30 DIAGNOSIS — F331 Major depressive disorder, recurrent, moderate: Secondary | ICD-10-CM | POA: Diagnosis not present

## 2022-09-30 DIAGNOSIS — R2689 Other abnormalities of gait and mobility: Secondary | ICD-10-CM | POA: Diagnosis not present

## 2022-09-30 NOTE — Telephone Encounter (Signed)
Pt has f/u with [facility] on 9/26. Please fax results of sleep study after visit to [facility] @ [phone].
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
## 2022-10-01 ENCOUNTER — Ambulatory Visit: Payer: Medicare Other | Admitting: Internal Medicine

## 2022-10-01 ENCOUNTER — Encounter: Payer: Self-pay | Admitting: Internal Medicine

## 2022-10-01 VITALS — BP 124/72 | HR 101 | Temp 97.0°F | Ht 61.0 in | Wt 299.0 lb

## 2022-10-01 DIAGNOSIS — J9611 Chronic respiratory failure with hypoxia: Secondary | ICD-10-CM

## 2022-10-01 DIAGNOSIS — R2689 Other abnormalities of gait and mobility: Secondary | ICD-10-CM | POA: Diagnosis not present

## 2022-10-01 DIAGNOSIS — M6281 Muscle weakness (generalized): Secondary | ICD-10-CM | POA: Diagnosis not present

## 2022-10-01 DIAGNOSIS — M47816 Spondylosis without myelopathy or radiculopathy, lumbar region: Secondary | ICD-10-CM | POA: Diagnosis not present

## 2022-10-01 DIAGNOSIS — J42 Unspecified chronic bronchitis: Secondary | ICD-10-CM

## 2022-10-01 DIAGNOSIS — G4734 Idiopathic sleep related nonobstructive alveolar hypoventilation: Secondary | ICD-10-CM | POA: Diagnosis not present

## 2022-10-01 DIAGNOSIS — E0840 Diabetes mellitus due to underlying condition with diabetic neuropathy, unspecified: Secondary | ICD-10-CM | POA: Diagnosis not present

## 2022-10-01 DIAGNOSIS — G4733 Obstructive sleep apnea (adult) (pediatric): Secondary | ICD-10-CM | POA: Diagnosis not present

## 2022-10-01 DIAGNOSIS — K567 Ileus, unspecified: Secondary | ICD-10-CM | POA: Diagnosis not present

## 2022-10-01 DIAGNOSIS — Z741 Need for assistance with personal care: Secondary | ICD-10-CM | POA: Diagnosis not present

## 2022-10-01 DIAGNOSIS — I5040 Unspecified combined systolic (congestive) and diastolic (congestive) heart failure: Secondary | ICD-10-CM | POA: Diagnosis not present

## 2022-10-01 DIAGNOSIS — R278 Other lack of coordination: Secondary | ICD-10-CM | POA: Diagnosis not present

## 2022-10-01 NOTE — Patient Instructions (Signed)
Order- home sleep test    dx OSA, Nocturnal Hypoxemia  Attention [facility]- wears O2. Need to use WatchPat ?  Please call us about 2 weeks after your sleep test for results and recommendations

## 2022-10-03 DIAGNOSIS — M47816 Spondylosis without myelopathy or radiculopathy, lumbar region: Secondary | ICD-10-CM | POA: Diagnosis not present

## 2022-10-03 DIAGNOSIS — K567 Ileus, unspecified: Secondary | ICD-10-CM | POA: Diagnosis not present

## 2022-10-03 DIAGNOSIS — R278 Other lack of coordination: Secondary | ICD-10-CM | POA: Diagnosis not present

## 2022-10-03 DIAGNOSIS — M6281 Muscle weakness (generalized): Secondary | ICD-10-CM | POA: Diagnosis not present

## 2022-10-03 DIAGNOSIS — R2689 Other abnormalities of gait and mobility: Secondary | ICD-10-CM | POA: Diagnosis not present

## 2022-10-03 DIAGNOSIS — Z741 Need for assistance with personal care: Secondary | ICD-10-CM | POA: Diagnosis not present

## 2022-10-03 DIAGNOSIS — E0840 Diabetes mellitus due to underlying condition with diabetic neuropathy, unspecified: Secondary | ICD-10-CM | POA: Diagnosis not present

## 2022-10-03 DIAGNOSIS — I5040 Unspecified combined systolic (congestive) and diastolic (congestive) heart failure: Secondary | ICD-10-CM | POA: Diagnosis not present

## 2022-10-04 ENCOUNTER — Encounter: Payer: Self-pay | Admitting: Internal Medicine

## 2022-10-04 DIAGNOSIS — J9611 Chronic respiratory failure with hypoxia: Secondary | ICD-10-CM | POA: Insufficient documentation

## 2022-10-04 DIAGNOSIS — K567 Ileus, unspecified: Secondary | ICD-10-CM | POA: Diagnosis not present

## 2022-10-04 DIAGNOSIS — M47816 Spondylosis without myelopathy or radiculopathy, lumbar region: Secondary | ICD-10-CM | POA: Diagnosis not present

## 2022-10-04 DIAGNOSIS — R2689 Other abnormalities of gait and mobility: Secondary | ICD-10-CM | POA: Diagnosis not present

## 2022-10-04 DIAGNOSIS — R278 Other lack of coordination: Secondary | ICD-10-CM | POA: Diagnosis not present

## 2022-10-04 DIAGNOSIS — M6281 Muscle weakness (generalized): Secondary | ICD-10-CM | POA: Diagnosis not present

## 2022-10-04 DIAGNOSIS — Z741 Need for assistance with personal care: Secondary | ICD-10-CM | POA: Diagnosis not present

## 2022-10-04 DIAGNOSIS — E0840 Diabetes mellitus due to underlying condition with diabetic neuropathy, unspecified: Secondary | ICD-10-CM | POA: Diagnosis not present

## 2022-10-04 DIAGNOSIS — I5040 Unspecified combined systolic (congestive) and diastolic (congestive) heart failure: Secondary | ICD-10-CM | POA: Diagnosis not present

## 2022-10-04 NOTE — Assessment & Plan Note (Addendum)
Uncertain components of COPD, CHF and significant obesity hypoventilation Plan- continue O2 2-4 L 24/7

## 2022-10-04 NOTE — Assessment & Plan Note (Signed)
She is too obese to fit in our PFT machine Reports using inhalers only 1-2 x/ month

## 2022-10-04 NOTE — Assessment & Plan Note (Signed)
Needs updated sleep study. Ideally would be in-center, but mobility prohibitive. With her O2, will see if we can get Watchpat/ [place] study.

## 2022-10-06 DIAGNOSIS — K567 Ileus, unspecified: Secondary | ICD-10-CM | POA: Diagnosis not present

## 2022-10-06 DIAGNOSIS — G4709 Other insomnia: Secondary | ICD-10-CM | POA: Diagnosis not present

## 2022-10-06 DIAGNOSIS — E0840 Diabetes mellitus due to underlying condition with diabetic neuropathy, unspecified: Secondary | ICD-10-CM | POA: Diagnosis not present

## 2022-10-06 DIAGNOSIS — M6281 Muscle weakness (generalized): Secondary | ICD-10-CM | POA: Diagnosis not present

## 2022-10-06 DIAGNOSIS — F339 Major depressive disorder, recurrent, unspecified: Secondary | ICD-10-CM | POA: Diagnosis not present

## 2022-10-06 DIAGNOSIS — R278 Other lack of coordination: Secondary | ICD-10-CM | POA: Diagnosis not present

## 2022-10-06 DIAGNOSIS — F419 Anxiety disorder, unspecified: Secondary | ICD-10-CM | POA: Diagnosis not present

## 2022-10-06 DIAGNOSIS — F250 Schizoaffective disorder, bipolar type: Secondary | ICD-10-CM | POA: Diagnosis not present

## 2022-10-07 ENCOUNTER — Telehealth: Payer: Self-pay | Admitting: Internal Medicine

## 2022-10-07 DIAGNOSIS — F331 Major depressive disorder, recurrent, moderate: Secondary | ICD-10-CM | POA: Diagnosis not present

## 2022-10-07 DIAGNOSIS — F411 Generalized anxiety disorder: Secondary | ICD-10-CM | POA: Diagnosis not present

## 2022-10-07 NOTE — Telephone Encounter (Signed)
Split night has been printed and faxed. Nfn

## 2022-10-07 NOTE — Telephone Encounter (Signed)
Per prev encounter [facility] has not rcvd all pages of faxed document @ [phone].

## 2022-10-08 ENCOUNTER — Telehealth: Payer: Self-pay | Admitting: Internal Medicine

## 2022-10-08 NOTE — Telephone Encounter (Signed)
Faith Flores is calling wanting to know when the home sleep study will be ordered for patient. It was documented in last office notes that there would be one done. Please call and advise.

## 2022-10-08 NOTE — Telephone Encounter (Signed)
Order was placed in [facility] on 10/02/22 they should be contacting her

## 2022-10-11 DIAGNOSIS — K567 Ileus, unspecified: Secondary | ICD-10-CM | POA: Diagnosis not present

## 2022-10-11 DIAGNOSIS — E0840 Diabetes mellitus due to underlying condition with diabetic neuropathy, unspecified: Secondary | ICD-10-CM | POA: Diagnosis not present

## 2022-10-11 DIAGNOSIS — M6281 Muscle weakness (generalized): Secondary | ICD-10-CM | POA: Diagnosis not present

## 2022-10-11 DIAGNOSIS — R278 Other lack of coordination: Secondary | ICD-10-CM | POA: Diagnosis not present

## 2022-10-12 DIAGNOSIS — E0840 Diabetes mellitus due to underlying condition with diabetic neuropathy, unspecified: Secondary | ICD-10-CM | POA: Diagnosis not present

## 2022-10-12 DIAGNOSIS — M6281 Muscle weakness (generalized): Secondary | ICD-10-CM | POA: Diagnosis not present

## 2022-10-12 DIAGNOSIS — K567 Ileus, unspecified: Secondary | ICD-10-CM | POA: Diagnosis not present

## 2022-10-12 DIAGNOSIS — R278 Other lack of coordination: Secondary | ICD-10-CM | POA: Diagnosis not present

## 2022-10-13 DIAGNOSIS — M6281 Muscle weakness (generalized): Secondary | ICD-10-CM | POA: Diagnosis not present

## 2022-10-13 DIAGNOSIS — R278 Other lack of coordination: Secondary | ICD-10-CM | POA: Diagnosis not present

## 2022-10-13 DIAGNOSIS — K567 Ileus, unspecified: Secondary | ICD-10-CM | POA: Diagnosis not present

## 2022-10-13 DIAGNOSIS — E0840 Diabetes mellitus due to underlying condition with diabetic neuropathy, unspecified: Secondary | ICD-10-CM | POA: Diagnosis not present

## 2022-10-13 NOTE — Telephone Encounter (Signed)
Split night study refaxed to the number provided.

## 2022-10-16 DIAGNOSIS — I5043 Acute on chronic combined systolic (congestive) and diastolic (congestive) heart failure: Secondary | ICD-10-CM | POA: Diagnosis not present

## 2022-10-16 DIAGNOSIS — F319 Bipolar disorder, unspecified: Secondary | ICD-10-CM | POA: Diagnosis not present

## 2022-10-16 DIAGNOSIS — I10 Essential (primary) hypertension: Secondary | ICD-10-CM | POA: Diagnosis not present

## 2022-10-16 DIAGNOSIS — J449 Chronic obstructive pulmonary disease, unspecified: Secondary | ICD-10-CM | POA: Diagnosis not present

## 2022-10-17 DIAGNOSIS — R278 Other lack of coordination: Secondary | ICD-10-CM | POA: Diagnosis not present

## 2022-10-17 DIAGNOSIS — E0840 Diabetes mellitus due to underlying condition with diabetic neuropathy, unspecified: Secondary | ICD-10-CM | POA: Diagnosis not present

## 2022-10-17 DIAGNOSIS — K567 Ileus, unspecified: Secondary | ICD-10-CM | POA: Diagnosis not present

## 2022-10-17 DIAGNOSIS — M6281 Muscle weakness (generalized): Secondary | ICD-10-CM | POA: Diagnosis not present

## 2022-10-19 DIAGNOSIS — R278 Other lack of coordination: Secondary | ICD-10-CM | POA: Diagnosis not present

## 2022-10-19 DIAGNOSIS — K567 Ileus, unspecified: Secondary | ICD-10-CM | POA: Diagnosis not present

## 2022-10-19 DIAGNOSIS — E0840 Diabetes mellitus due to underlying condition with diabetic neuropathy, unspecified: Secondary | ICD-10-CM | POA: Diagnosis not present

## 2022-10-19 DIAGNOSIS — M6281 Muscle weakness (generalized): Secondary | ICD-10-CM | POA: Diagnosis not present

## 2022-10-20 DIAGNOSIS — M25561 Pain in right knee: Secondary | ICD-10-CM | POA: Diagnosis not present

## 2022-10-20 DIAGNOSIS — J9611 Chronic respiratory failure with hypoxia: Secondary | ICD-10-CM | POA: Diagnosis not present

## 2022-10-20 DIAGNOSIS — J449 Chronic obstructive pulmonary disease, unspecified: Secondary | ICD-10-CM | POA: Diagnosis not present

## 2022-10-20 DIAGNOSIS — I5022 Chronic systolic (congestive) heart failure: Secondary | ICD-10-CM | POA: Diagnosis not present

## 2022-10-21 DIAGNOSIS — F411 Generalized anxiety disorder: Secondary | ICD-10-CM | POA: Diagnosis not present

## 2022-10-21 DIAGNOSIS — E119 Type 2 diabetes mellitus without complications: Secondary | ICD-10-CM | POA: Diagnosis not present

## 2022-10-21 DIAGNOSIS — I10 Essential (primary) hypertension: Secondary | ICD-10-CM | POA: Diagnosis not present

## 2022-10-21 DIAGNOSIS — E785 Hyperlipidemia, unspecified: Secondary | ICD-10-CM | POA: Diagnosis not present

## 2022-10-21 DIAGNOSIS — F331 Major depressive disorder, recurrent, moderate: Secondary | ICD-10-CM | POA: Diagnosis not present

## 2022-10-21 DIAGNOSIS — I480 Paroxysmal atrial fibrillation: Secondary | ICD-10-CM | POA: Diagnosis not present

## 2022-10-25 DIAGNOSIS — K567 Ileus, unspecified: Secondary | ICD-10-CM | POA: Diagnosis not present

## 2022-10-25 DIAGNOSIS — M6281 Muscle weakness (generalized): Secondary | ICD-10-CM | POA: Diagnosis not present

## 2022-10-25 DIAGNOSIS — E0840 Diabetes mellitus due to underlying condition with diabetic neuropathy, unspecified: Secondary | ICD-10-CM | POA: Diagnosis not present

## 2022-10-25 DIAGNOSIS — R278 Other lack of coordination: Secondary | ICD-10-CM | POA: Diagnosis not present

## 2022-10-28 DIAGNOSIS — G894 Chronic pain syndrome: Secondary | ICD-10-CM | POA: Diagnosis not present

## 2022-10-28 DIAGNOSIS — I5022 Chronic systolic (congestive) heart failure: Secondary | ICD-10-CM | POA: Diagnosis not present

## 2022-10-28 DIAGNOSIS — J449 Chronic obstructive pulmonary disease, unspecified: Secondary | ICD-10-CM | POA: Diagnosis not present

## 2022-10-28 DIAGNOSIS — E1142 Type 2 diabetes mellitus with diabetic polyneuropathy: Secondary | ICD-10-CM | POA: Diagnosis not present

## 2022-10-29 DIAGNOSIS — K567 Ileus, unspecified: Secondary | ICD-10-CM | POA: Diagnosis not present

## 2022-10-29 DIAGNOSIS — R278 Other lack of coordination: Secondary | ICD-10-CM | POA: Diagnosis not present

## 2022-10-29 DIAGNOSIS — M6281 Muscle weakness (generalized): Secondary | ICD-10-CM | POA: Diagnosis not present

## 2022-10-29 DIAGNOSIS — I5043 Acute on chronic combined systolic (congestive) and diastolic (congestive) heart failure: Secondary | ICD-10-CM | POA: Diagnosis not present

## 2022-10-29 DIAGNOSIS — J449 Chronic obstructive pulmonary disease, unspecified: Secondary | ICD-10-CM | POA: Diagnosis not present

## 2022-10-29 DIAGNOSIS — E785 Hyperlipidemia, unspecified: Secondary | ICD-10-CM | POA: Diagnosis not present

## 2022-10-29 DIAGNOSIS — E119 Type 2 diabetes mellitus without complications: Secondary | ICD-10-CM | POA: Diagnosis not present

## 2022-10-29 DIAGNOSIS — E0840 Diabetes mellitus due to underlying condition with diabetic neuropathy, unspecified: Secondary | ICD-10-CM | POA: Diagnosis not present

## 2022-10-30 DIAGNOSIS — M6281 Muscle weakness (generalized): Secondary | ICD-10-CM | POA: Diagnosis not present

## 2022-10-30 DIAGNOSIS — R278 Other lack of coordination: Secondary | ICD-10-CM | POA: Diagnosis not present

## 2022-10-30 DIAGNOSIS — K567 Ileus, unspecified: Secondary | ICD-10-CM | POA: Diagnosis not present

## 2022-10-30 DIAGNOSIS — E0840 Diabetes mellitus due to underlying condition with diabetic neuropathy, unspecified: Secondary | ICD-10-CM | POA: Diagnosis not present

## 2022-11-02 DIAGNOSIS — E0840 Diabetes mellitus due to underlying condition with diabetic neuropathy, unspecified: Secondary | ICD-10-CM | POA: Diagnosis not present

## 2022-11-02 DIAGNOSIS — K567 Ileus, unspecified: Secondary | ICD-10-CM | POA: Diagnosis not present

## 2022-11-02 DIAGNOSIS — M6281 Muscle weakness (generalized): Secondary | ICD-10-CM | POA: Diagnosis not present

## 2022-11-02 DIAGNOSIS — R278 Other lack of coordination: Secondary | ICD-10-CM | POA: Diagnosis not present

## 2022-11-06 DIAGNOSIS — E0840 Diabetes mellitus due to underlying condition with diabetic neuropathy, unspecified: Secondary | ICD-10-CM | POA: Diagnosis not present

## 2022-11-06 DIAGNOSIS — K567 Ileus, unspecified: Secondary | ICD-10-CM | POA: Diagnosis not present

## 2022-11-06 DIAGNOSIS — R278 Other lack of coordination: Secondary | ICD-10-CM | POA: Diagnosis not present

## 2022-11-06 DIAGNOSIS — M6281 Muscle weakness (generalized): Secondary | ICD-10-CM | POA: Diagnosis not present

## 2022-11-10 DIAGNOSIS — L603 Nail dystrophy: Secondary | ICD-10-CM | POA: Diagnosis not present

## 2022-11-10 DIAGNOSIS — M6281 Muscle weakness (generalized): Secondary | ICD-10-CM | POA: Diagnosis not present

## 2022-11-10 DIAGNOSIS — R278 Other lack of coordination: Secondary | ICD-10-CM | POA: Diagnosis not present

## 2022-11-10 DIAGNOSIS — Z7984 Long term (current) use of oral hypoglycemic drugs: Secondary | ICD-10-CM | POA: Diagnosis not present

## 2022-11-10 DIAGNOSIS — K567 Ileus, unspecified: Secondary | ICD-10-CM | POA: Diagnosis not present

## 2022-11-10 DIAGNOSIS — E1151 Type 2 diabetes mellitus with diabetic peripheral angiopathy without gangrene: Secondary | ICD-10-CM | POA: Diagnosis not present

## 2022-11-10 DIAGNOSIS — L602 Onychogryphosis: Secondary | ICD-10-CM | POA: Diagnosis not present

## 2022-11-10 DIAGNOSIS — E0840 Diabetes mellitus due to underlying condition with diabetic neuropathy, unspecified: Secondary | ICD-10-CM | POA: Diagnosis not present

## 2022-11-12 DIAGNOSIS — G894 Chronic pain syndrome: Secondary | ICD-10-CM | POA: Diagnosis not present

## 2022-11-12 DIAGNOSIS — G63 Polyneuropathy in diseases classified elsewhere: Secondary | ICD-10-CM | POA: Diagnosis not present

## 2022-11-12 DIAGNOSIS — I5022 Chronic systolic (congestive) heart failure: Secondary | ICD-10-CM | POA: Diagnosis not present

## 2022-11-12 DIAGNOSIS — E0840 Diabetes mellitus due to underlying condition with diabetic neuropathy, unspecified: Secondary | ICD-10-CM | POA: Diagnosis not present

## 2022-11-12 DIAGNOSIS — E1142 Type 2 diabetes mellitus with diabetic polyneuropathy: Secondary | ICD-10-CM | POA: Diagnosis not present

## 2022-11-12 DIAGNOSIS — M6281 Muscle weakness (generalized): Secondary | ICD-10-CM | POA: Diagnosis not present

## 2022-11-12 DIAGNOSIS — R278 Other lack of coordination: Secondary | ICD-10-CM | POA: Diagnosis not present

## 2022-11-12 DIAGNOSIS — K567 Ileus, unspecified: Secondary | ICD-10-CM | POA: Diagnosis not present

## 2022-11-13 DIAGNOSIS — E0840 Diabetes mellitus due to underlying condition with diabetic neuropathy, unspecified: Secondary | ICD-10-CM | POA: Diagnosis not present

## 2022-11-13 DIAGNOSIS — R278 Other lack of coordination: Secondary | ICD-10-CM | POA: Diagnosis not present

## 2022-11-13 DIAGNOSIS — M6281 Muscle weakness (generalized): Secondary | ICD-10-CM | POA: Diagnosis not present

## 2022-11-13 DIAGNOSIS — K567 Ileus, unspecified: Secondary | ICD-10-CM | POA: Diagnosis not present

## 2022-11-16 DIAGNOSIS — R278 Other lack of coordination: Secondary | ICD-10-CM | POA: Diagnosis not present

## 2022-11-16 DIAGNOSIS — M6281 Muscle weakness (generalized): Secondary | ICD-10-CM | POA: Diagnosis not present

## 2022-11-16 DIAGNOSIS — K567 Ileus, unspecified: Secondary | ICD-10-CM | POA: Diagnosis not present

## 2022-11-16 DIAGNOSIS — E0840 Diabetes mellitus due to underlying condition with diabetic neuropathy, unspecified: Secondary | ICD-10-CM | POA: Diagnosis not present

## 2022-11-17 DIAGNOSIS — I5022 Chronic systolic (congestive) heart failure: Secondary | ICD-10-CM | POA: Diagnosis not present

## 2022-11-17 DIAGNOSIS — F411 Generalized anxiety disorder: Secondary | ICD-10-CM | POA: Diagnosis not present

## 2022-11-17 DIAGNOSIS — J9611 Chronic respiratory failure with hypoxia: Secondary | ICD-10-CM | POA: Diagnosis not present

## 2022-11-17 DIAGNOSIS — J449 Chronic obstructive pulmonary disease, unspecified: Secondary | ICD-10-CM | POA: Diagnosis not present

## 2022-11-18 DIAGNOSIS — M6281 Muscle weakness (generalized): Secondary | ICD-10-CM | POA: Diagnosis not present

## 2022-11-18 DIAGNOSIS — F411 Generalized anxiety disorder: Secondary | ICD-10-CM | POA: Diagnosis not present

## 2022-11-18 DIAGNOSIS — N390 Urinary tract infection, site not specified: Secondary | ICD-10-CM | POA: Diagnosis not present

## 2022-11-18 DIAGNOSIS — R278 Other lack of coordination: Secondary | ICD-10-CM | POA: Diagnosis not present

## 2022-11-18 DIAGNOSIS — E0840 Diabetes mellitus due to underlying condition with diabetic neuropathy, unspecified: Secondary | ICD-10-CM | POA: Diagnosis not present

## 2022-11-18 DIAGNOSIS — K567 Ileus, unspecified: Secondary | ICD-10-CM | POA: Diagnosis not present

## 2022-11-18 DIAGNOSIS — F331 Major depressive disorder, recurrent, moderate: Secondary | ICD-10-CM | POA: Diagnosis not present

## 2022-11-20 DIAGNOSIS — I5022 Chronic systolic (congestive) heart failure: Secondary | ICD-10-CM | POA: Diagnosis not present

## 2022-11-20 DIAGNOSIS — J449 Chronic obstructive pulmonary disease, unspecified: Secondary | ICD-10-CM | POA: Diagnosis not present

## 2022-11-20 DIAGNOSIS — K567 Ileus, unspecified: Secondary | ICD-10-CM | POA: Diagnosis not present

## 2022-11-20 DIAGNOSIS — R278 Other lack of coordination: Secondary | ICD-10-CM | POA: Diagnosis not present

## 2022-11-20 DIAGNOSIS — J9611 Chronic respiratory failure with hypoxia: Secondary | ICD-10-CM | POA: Diagnosis not present

## 2022-11-20 DIAGNOSIS — M6281 Muscle weakness (generalized): Secondary | ICD-10-CM | POA: Diagnosis not present

## 2022-11-20 DIAGNOSIS — G894 Chronic pain syndrome: Secondary | ICD-10-CM | POA: Diagnosis not present

## 2022-11-20 DIAGNOSIS — E0840 Diabetes mellitus due to underlying condition with diabetic neuropathy, unspecified: Secondary | ICD-10-CM | POA: Diagnosis not present

## 2022-11-23 DIAGNOSIS — K567 Ileus, unspecified: Secondary | ICD-10-CM | POA: Diagnosis not present

## 2022-11-23 DIAGNOSIS — R278 Other lack of coordination: Secondary | ICD-10-CM | POA: Diagnosis not present

## 2022-11-23 DIAGNOSIS — E0840 Diabetes mellitus due to underlying condition with diabetic neuropathy, unspecified: Secondary | ICD-10-CM | POA: Diagnosis not present

## 2022-11-23 DIAGNOSIS — M6281 Muscle weakness (generalized): Secondary | ICD-10-CM | POA: Diagnosis not present

## 2022-11-25 DIAGNOSIS — E0840 Diabetes mellitus due to underlying condition with diabetic neuropathy, unspecified: Secondary | ICD-10-CM | POA: Diagnosis not present

## 2022-11-25 DIAGNOSIS — K567 Ileus, unspecified: Secondary | ICD-10-CM | POA: Diagnosis not present

## 2022-11-25 DIAGNOSIS — R278 Other lack of coordination: Secondary | ICD-10-CM | POA: Diagnosis not present

## 2022-11-25 DIAGNOSIS — M6281 Muscle weakness (generalized): Secondary | ICD-10-CM | POA: Diagnosis not present

## 2022-11-26 DIAGNOSIS — E1142 Type 2 diabetes mellitus with diabetic polyneuropathy: Secondary | ICD-10-CM | POA: Diagnosis not present

## 2022-11-26 DIAGNOSIS — J449 Chronic obstructive pulmonary disease, unspecified: Secondary | ICD-10-CM | POA: Diagnosis not present

## 2022-11-26 DIAGNOSIS — I5022 Chronic systolic (congestive) heart failure: Secondary | ICD-10-CM | POA: Diagnosis not present

## 2022-11-26 DIAGNOSIS — J9611 Chronic respiratory failure with hypoxia: Secondary | ICD-10-CM | POA: Diagnosis not present

## 2022-11-27 DIAGNOSIS — R278 Other lack of coordination: Secondary | ICD-10-CM | POA: Diagnosis not present

## 2022-11-27 DIAGNOSIS — K567 Ileus, unspecified: Secondary | ICD-10-CM | POA: Diagnosis not present

## 2022-11-27 DIAGNOSIS — E0840 Diabetes mellitus due to underlying condition with diabetic neuropathy, unspecified: Secondary | ICD-10-CM | POA: Diagnosis not present

## 2022-11-27 DIAGNOSIS — M6281 Muscle weakness (generalized): Secondary | ICD-10-CM | POA: Diagnosis not present

## 2022-11-28 DIAGNOSIS — K567 Ileus, unspecified: Secondary | ICD-10-CM | POA: Diagnosis not present

## 2022-11-28 DIAGNOSIS — E0840 Diabetes mellitus due to underlying condition with diabetic neuropathy, unspecified: Secondary | ICD-10-CM | POA: Diagnosis not present

## 2022-11-28 DIAGNOSIS — R278 Other lack of coordination: Secondary | ICD-10-CM | POA: Diagnosis not present

## 2022-11-28 DIAGNOSIS — M6281 Muscle weakness (generalized): Secondary | ICD-10-CM | POA: Diagnosis not present

## 2022-11-29 DIAGNOSIS — M6281 Muscle weakness (generalized): Secondary | ICD-10-CM | POA: Diagnosis not present

## 2022-11-29 DIAGNOSIS — K567 Ileus, unspecified: Secondary | ICD-10-CM | POA: Diagnosis not present

## 2022-11-29 DIAGNOSIS — R278 Other lack of coordination: Secondary | ICD-10-CM | POA: Diagnosis not present

## 2022-11-29 DIAGNOSIS — E0840 Diabetes mellitus due to underlying condition with diabetic neuropathy, unspecified: Secondary | ICD-10-CM | POA: Diagnosis not present

## 2022-11-30 DIAGNOSIS — I480 Paroxysmal atrial fibrillation: Secondary | ICD-10-CM | POA: Diagnosis not present

## 2022-11-30 DIAGNOSIS — E119 Type 2 diabetes mellitus without complications: Secondary | ICD-10-CM | POA: Diagnosis not present

## 2022-11-30 DIAGNOSIS — I10 Essential (primary) hypertension: Secondary | ICD-10-CM | POA: Diagnosis not present

## 2022-11-30 DIAGNOSIS — E785 Hyperlipidemia, unspecified: Secondary | ICD-10-CM | POA: Diagnosis not present

## 2022-12-01 DIAGNOSIS — G894 Chronic pain syndrome: Secondary | ICD-10-CM | POA: Diagnosis not present

## 2022-12-01 DIAGNOSIS — J9611 Chronic respiratory failure with hypoxia: Secondary | ICD-10-CM | POA: Diagnosis not present

## 2022-12-01 DIAGNOSIS — R278 Other lack of coordination: Secondary | ICD-10-CM | POA: Diagnosis not present

## 2022-12-01 DIAGNOSIS — K567 Ileus, unspecified: Secondary | ICD-10-CM | POA: Diagnosis not present

## 2022-12-01 DIAGNOSIS — M6281 Muscle weakness (generalized): Secondary | ICD-10-CM | POA: Diagnosis not present

## 2022-12-01 DIAGNOSIS — E0840 Diabetes mellitus due to underlying condition with diabetic neuropathy, unspecified: Secondary | ICD-10-CM | POA: Diagnosis not present

## 2022-12-01 DIAGNOSIS — I5022 Chronic systolic (congestive) heart failure: Secondary | ICD-10-CM | POA: Diagnosis not present

## 2022-12-01 DIAGNOSIS — J449 Chronic obstructive pulmonary disease, unspecified: Secondary | ICD-10-CM | POA: Diagnosis not present

## 2022-12-02 DIAGNOSIS — L97509 Non-pressure chronic ulcer of other part of unspecified foot with unspecified severity: Secondary | ICD-10-CM | POA: Diagnosis not present

## 2022-12-02 DIAGNOSIS — F319 Bipolar disorder, unspecified: Secondary | ICD-10-CM | POA: Diagnosis not present

## 2022-12-02 DIAGNOSIS — I10 Essential (primary) hypertension: Secondary | ICD-10-CM | POA: Diagnosis not present

## 2022-12-02 DIAGNOSIS — I5043 Acute on chronic combined systolic (congestive) and diastolic (congestive) heart failure: Secondary | ICD-10-CM | POA: Diagnosis not present

## 2022-12-02 DIAGNOSIS — L03115 Cellulitis of right lower limb: Secondary | ICD-10-CM | POA: Diagnosis not present

## 2022-12-06 DIAGNOSIS — E119 Type 2 diabetes mellitus without complications: Secondary | ICD-10-CM | POA: Diagnosis not present

## 2022-12-09 DIAGNOSIS — E0840 Diabetes mellitus due to underlying condition with diabetic neuropathy, unspecified: Secondary | ICD-10-CM | POA: Diagnosis not present

## 2022-12-09 DIAGNOSIS — I5040 Unspecified combined systolic (congestive) and diastolic (congestive) heart failure: Secondary | ICD-10-CM | POA: Diagnosis not present

## 2022-12-09 DIAGNOSIS — R41841 Cognitive communication deficit: Secondary | ICD-10-CM | POA: Diagnosis not present

## 2022-12-09 DIAGNOSIS — J449 Chronic obstructive pulmonary disease, unspecified: Secondary | ICD-10-CM | POA: Diagnosis not present

## 2022-12-10 ENCOUNTER — Other Ambulatory Visit: Payer: Self-pay | Admitting: Adult Health Nurse Practitioner

## 2022-12-10 DIAGNOSIS — F411 Generalized anxiety disorder: Secondary | ICD-10-CM | POA: Diagnosis not present

## 2022-12-10 DIAGNOSIS — I5022 Chronic systolic (congestive) heart failure: Secondary | ICD-10-CM | POA: Diagnosis not present

## 2022-12-10 DIAGNOSIS — J9611 Chronic respiratory failure with hypoxia: Secondary | ICD-10-CM | POA: Diagnosis not present

## 2022-12-10 DIAGNOSIS — J449 Chronic obstructive pulmonary disease, unspecified: Secondary | ICD-10-CM | POA: Diagnosis not present

## 2022-12-10 DIAGNOSIS — Z1231 Encounter for screening mammogram for malignant neoplasm of breast: Secondary | ICD-10-CM

## 2022-12-14 DIAGNOSIS — I5022 Chronic systolic (congestive) heart failure: Secondary | ICD-10-CM | POA: Diagnosis not present

## 2022-12-14 DIAGNOSIS — G894 Chronic pain syndrome: Secondary | ICD-10-CM | POA: Diagnosis not present

## 2022-12-14 DIAGNOSIS — J449 Chronic obstructive pulmonary disease, unspecified: Secondary | ICD-10-CM | POA: Diagnosis not present

## 2022-12-14 DIAGNOSIS — I739 Peripheral vascular disease, unspecified: Secondary | ICD-10-CM | POA: Diagnosis not present

## 2022-12-24 DIAGNOSIS — G894 Chronic pain syndrome: Secondary | ICD-10-CM | POA: Diagnosis not present

## 2022-12-24 DIAGNOSIS — G63 Polyneuropathy in diseases classified elsewhere: Secondary | ICD-10-CM | POA: Diagnosis not present

## 2022-12-24 DIAGNOSIS — I739 Peripheral vascular disease, unspecified: Secondary | ICD-10-CM | POA: Diagnosis not present

## 2022-12-24 DIAGNOSIS — I5022 Chronic systolic (congestive) heart failure: Secondary | ICD-10-CM | POA: Diagnosis not present
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
## 2022-12-28 ENCOUNTER — Ambulatory Visit: Payer: Medicare Other | Admitting: Internal Medicine

## 2022-12-28 ENCOUNTER — Telehealth: Payer: Self-pay | Admitting: Internal Medicine

## 2022-12-28 NOTE — Telephone Encounter (Signed)
Per Victorino Dike with [facility], they have received the order or patients, HST, however they have not received paperwork for billing purposes from the facility patient is located.  Because pt is in the facility, the facility has taken ownership of patient's care & the facility will need to complete the paperwork for billing purposes for the HST.  Victorino Dike states they have sent an email to [facility] at the facility with the required forms on 10/14/22 & 11/24/22 & have not gotten a response yet from her.

## 2022-12-29 DIAGNOSIS — F329 Major depressive disorder, single episode, unspecified: Secondary | ICD-10-CM | POA: Diagnosis not present

## 2022-12-29 DIAGNOSIS — E119 Type 2 diabetes mellitus without complications: Secondary | ICD-10-CM | POA: Diagnosis not present

## 2022-12-29 DIAGNOSIS — I480 Paroxysmal atrial fibrillation: Secondary | ICD-10-CM | POA: Diagnosis not present

## 2022-12-29 DIAGNOSIS — I10 Essential (primary) hypertension: Secondary | ICD-10-CM | POA: Diagnosis not present

## 2022-12-31 DIAGNOSIS — I739 Peripheral vascular disease, unspecified: Secondary | ICD-10-CM | POA: Diagnosis not present

## 2022-12-31 DIAGNOSIS — I5022 Chronic systolic (congestive) heart failure: Secondary | ICD-10-CM | POA: Diagnosis not present

## 2022-12-31 DIAGNOSIS — J449 Chronic obstructive pulmonary disease, unspecified: Secondary | ICD-10-CM | POA: Diagnosis not present

## 2022-12-31 DIAGNOSIS — G63 Polyneuropathy in diseases classified elsewhere: Secondary | ICD-10-CM | POA: Diagnosis not present

## 2022-12-31 DIAGNOSIS — R601 Generalized edema: Secondary | ICD-10-CM | POA: Diagnosis not present

## 2023-01-06 DIAGNOSIS — E119 Type 2 diabetes mellitus without complications: Secondary | ICD-10-CM | POA: Diagnosis not present

## 2023-01-07 DIAGNOSIS — I5043 Acute on chronic combined systolic (congestive) and diastolic (congestive) heart failure: Secondary | ICD-10-CM | POA: Diagnosis not present

## 2023-01-07 DIAGNOSIS — I10 Essential (primary) hypertension: Secondary | ICD-10-CM | POA: Diagnosis not present

## 2023-01-07 DIAGNOSIS — J449 Chronic obstructive pulmonary disease, unspecified: Secondary | ICD-10-CM | POA: Diagnosis not present

## 2023-01-07 DIAGNOSIS — F319 Bipolar disorder, unspecified: Secondary | ICD-10-CM | POA: Diagnosis not present

## 2023-01-12 ENCOUNTER — Inpatient Hospital Stay
Admission: RE | Admit: 2023-01-12 | Discharge: 2023-01-12 | Disposition: A | Discharge status: Home / Self Care | Payer: Medicare Other | Source: Ambulatory Visit | Attending: Adult Health Nurse Practitioner | Admitting: Adult Health Nurse Practitioner

## 2023-01-12 ENCOUNTER — Ambulatory Visit: Payer: Medicare Other

## 2023-01-12 DIAGNOSIS — Z1231 Encounter for screening mammogram for malignant neoplasm of breast: Secondary | ICD-10-CM | POA: Diagnosis not present

## 2023-01-13 DIAGNOSIS — G894 Chronic pain syndrome: Secondary | ICD-10-CM | POA: Diagnosis not present

## 2023-01-13 DIAGNOSIS — F250 Schizoaffective disorder, bipolar type: Secondary | ICD-10-CM | POA: Diagnosis not present

## 2023-01-13 DIAGNOSIS — E1142 Type 2 diabetes mellitus with diabetic polyneuropathy: Secondary | ICD-10-CM | POA: Diagnosis not present

## 2023-01-13 DIAGNOSIS — I5022 Chronic systolic (congestive) heart failure: Secondary | ICD-10-CM | POA: Diagnosis not present

## 2023-01-15 DIAGNOSIS — E0840 Diabetes mellitus due to underlying condition with diabetic neuropathy, unspecified: Secondary | ICD-10-CM | POA: Diagnosis not present

## 2023-01-15 DIAGNOSIS — D649 Anemia, unspecified: Secondary | ICD-10-CM | POA: Diagnosis not present

## 2023-01-15 DIAGNOSIS — I5040 Unspecified combined systolic (congestive) and diastolic (congestive) heart failure: Secondary | ICD-10-CM | POA: Diagnosis not present

## 2023-01-15 DIAGNOSIS — R41841 Cognitive communication deficit: Secondary | ICD-10-CM | POA: Diagnosis not present

## 2023-01-18 DIAGNOSIS — I5040 Unspecified combined systolic (congestive) and diastolic (congestive) heart failure: Secondary | ICD-10-CM | POA: Diagnosis not present

## 2023-01-18 DIAGNOSIS — R41841 Cognitive communication deficit: Secondary | ICD-10-CM | POA: Diagnosis not present

## 2023-01-18 DIAGNOSIS — E0840 Diabetes mellitus due to underlying condition with diabetic neuropathy, unspecified: Secondary | ICD-10-CM | POA: Diagnosis not present

## 2023-01-19 DIAGNOSIS — F250 Schizoaffective disorder, bipolar type: Secondary | ICD-10-CM | POA: Diagnosis not present

## 2023-01-19 DIAGNOSIS — F418 Other specified anxiety disorders: Secondary | ICD-10-CM | POA: Diagnosis not present

## 2023-01-19 DIAGNOSIS — G4709 Other insomnia: Secondary | ICD-10-CM | POA: Diagnosis not present

## 2023-01-20 DIAGNOSIS — K219 Gastro-esophageal reflux disease without esophagitis: Secondary | ICD-10-CM | POA: Diagnosis not present

## 2023-01-20 DIAGNOSIS — I5022 Chronic systolic (congestive) heart failure: Secondary | ICD-10-CM | POA: Diagnosis not present

## 2023-01-20 DIAGNOSIS — I5040 Unspecified combined systolic (congestive) and diastolic (congestive) heart failure: Secondary | ICD-10-CM | POA: Diagnosis not present

## 2023-01-20 DIAGNOSIS — R41841 Cognitive communication deficit: Secondary | ICD-10-CM | POA: Diagnosis not present

## 2023-01-20 DIAGNOSIS — E0840 Diabetes mellitus due to underlying condition with diabetic neuropathy, unspecified: Secondary | ICD-10-CM | POA: Diagnosis not present

## 2023-01-20 DIAGNOSIS — G894 Chronic pain syndrome: Secondary | ICD-10-CM | POA: Diagnosis not present

## 2023-01-20 DIAGNOSIS — F319 Bipolar disorder, unspecified: Secondary | ICD-10-CM | POA: Diagnosis not present

## 2023-01-22 ENCOUNTER — Other Ambulatory Visit: Payer: Self-pay

## 2023-01-22 DIAGNOSIS — I739 Peripheral vascular disease, unspecified: Secondary | ICD-10-CM

## 2023-01-23 DIAGNOSIS — E0840 Diabetes mellitus due to underlying condition with diabetic neuropathy, unspecified: Secondary | ICD-10-CM | POA: Diagnosis not present

## 2023-01-23 DIAGNOSIS — R41841 Cognitive communication deficit: Secondary | ICD-10-CM | POA: Diagnosis not present

## 2023-01-23 DIAGNOSIS — I5040 Unspecified combined systolic (congestive) and diastolic (congestive) heart failure: Secondary | ICD-10-CM | POA: Diagnosis not present

## 2023-01-27 DIAGNOSIS — R41841 Cognitive communication deficit: Secondary | ICD-10-CM | POA: Diagnosis not present

## 2023-01-27 DIAGNOSIS — E0840 Diabetes mellitus due to underlying condition with diabetic neuropathy, unspecified: Secondary | ICD-10-CM | POA: Diagnosis not present

## 2023-01-27 DIAGNOSIS — I5040 Unspecified combined systolic (congestive) and diastolic (congestive) heart failure: Secondary | ICD-10-CM | POA: Diagnosis not present

## 2023-01-28 DIAGNOSIS — E0840 Diabetes mellitus due to underlying condition with diabetic neuropathy, unspecified: Secondary | ICD-10-CM | POA: Diagnosis not present

## 2023-01-28 DIAGNOSIS — R41841 Cognitive communication deficit: Secondary | ICD-10-CM | POA: Diagnosis not present

## 2023-01-28 DIAGNOSIS — I5022 Chronic systolic (congestive) heart failure: Secondary | ICD-10-CM | POA: Diagnosis not present

## 2023-01-28 DIAGNOSIS — F411 Generalized anxiety disorder: Secondary | ICD-10-CM | POA: Diagnosis not present

## 2023-01-28 DIAGNOSIS — G894 Chronic pain syndrome: Secondary | ICD-10-CM | POA: Diagnosis not present

## 2023-01-28 DIAGNOSIS — I5040 Unspecified combined systolic (congestive) and diastolic (congestive) heart failure: Secondary | ICD-10-CM | POA: Diagnosis not present

## 2023-01-28 DIAGNOSIS — I739 Peripheral vascular disease, unspecified: Secondary | ICD-10-CM | POA: Diagnosis not present
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
## 2023-01-29 ENCOUNTER — Ambulatory Visit (INDEPENDENT_AMBULATORY_CARE_PROVIDER_SITE_OTHER): Payer: Medicare Other | Admitting: Vascular Surgery

## 2023-01-29 ENCOUNTER — Ambulatory Visit (HOSPITAL_COMMUNITY)
Admission: RE | Admit: 2023-01-29 | Discharge: 2023-01-29 | Disposition: A | Discharge status: Home / Self Care | Payer: Medicare Other | Source: Ambulatory Visit | Attending: Vascular Surgery

## 2023-01-29 ENCOUNTER — Encounter: Payer: Self-pay | Admitting: Vascular Surgery

## 2023-01-29 VITALS — BP 152/80 | HR 96 | Temp 97.8°F | Resp 20 | Ht 61.0 in | Wt 302.5 lb

## 2023-01-29 DIAGNOSIS — I739 Peripheral vascular disease, unspecified: Secondary | ICD-10-CM | POA: Insufficient documentation

## 2023-01-29 DIAGNOSIS — I890 Lymphedema, not elsewhere classified: Secondary | ICD-10-CM | POA: Diagnosis not present

## 2023-01-29 DIAGNOSIS — J101 Influenza due to other identified influenza virus with other respiratory manifestations: Secondary | ICD-10-CM | POA: Diagnosis not present

## 2023-01-29 DIAGNOSIS — D649 Anemia, unspecified: Secondary | ICD-10-CM | POA: Diagnosis not present

## 2023-01-29 DIAGNOSIS — I5040 Unspecified combined systolic (congestive) and diastolic (congestive) heart failure: Secondary | ICD-10-CM | POA: Diagnosis not present

## 2023-01-29 DIAGNOSIS — R41841 Cognitive communication deficit: Secondary | ICD-10-CM | POA: Diagnosis not present

## 2023-01-29 DIAGNOSIS — E0840 Diabetes mellitus due to underlying condition with diabetic neuropathy, unspecified: Secondary | ICD-10-CM | POA: Diagnosis not present

## 2023-01-29 LAB — VAS US ABI WITH/WO TBI
Left ABI: 1.36
Right ABI: 1.17

## 2023-02-01 DIAGNOSIS — R41841 Cognitive communication deficit: Secondary | ICD-10-CM | POA: Diagnosis not present

## 2023-02-01 DIAGNOSIS — I5040 Unspecified combined systolic (congestive) and diastolic (congestive) heart failure: Secondary | ICD-10-CM | POA: Diagnosis not present

## 2023-02-01 DIAGNOSIS — E0840 Diabetes mellitus due to underlying condition with diabetic neuropathy, unspecified: Secondary | ICD-10-CM | POA: Diagnosis not present

## 2023-02-03 DIAGNOSIS — I10 Essential (primary) hypertension: Secondary | ICD-10-CM | POA: Diagnosis not present

## 2023-02-03 DIAGNOSIS — I480 Paroxysmal atrial fibrillation: Secondary | ICD-10-CM | POA: Diagnosis not present

## 2023-02-03 DIAGNOSIS — R229 Localized swelling, mass and lump, unspecified: Secondary | ICD-10-CM | POA: Diagnosis not present

## 2023-02-03 DIAGNOSIS — F329 Major depressive disorder, single episode, unspecified: Secondary | ICD-10-CM | POA: Diagnosis not present

## 2023-02-03 DIAGNOSIS — E119 Type 2 diabetes mellitus without complications: Secondary | ICD-10-CM | POA: Diagnosis not present

## 2023-02-04 DIAGNOSIS — I5040 Unspecified combined systolic (congestive) and diastolic (congestive) heart failure: Secondary | ICD-10-CM | POA: Diagnosis not present

## 2023-02-04 DIAGNOSIS — E0840 Diabetes mellitus due to underlying condition with diabetic neuropathy, unspecified: Secondary | ICD-10-CM | POA: Diagnosis not present

## 2023-02-04 DIAGNOSIS — R41841 Cognitive communication deficit: Secondary | ICD-10-CM | POA: Diagnosis not present

## 2023-02-05 DIAGNOSIS — F411 Generalized anxiety disorder: Secondary | ICD-10-CM | POA: Diagnosis not present

## 2023-02-05 DIAGNOSIS — E1142 Type 2 diabetes mellitus with diabetic polyneuropathy: Secondary | ICD-10-CM | POA: Diagnosis not present

## 2023-02-05 DIAGNOSIS — G894 Chronic pain syndrome: Secondary | ICD-10-CM | POA: Diagnosis not present

## 2023-02-05 DIAGNOSIS — G63 Polyneuropathy in diseases classified elsewhere: Secondary | ICD-10-CM | POA: Diagnosis not present

## 2023-02-06 DIAGNOSIS — E119 Type 2 diabetes mellitus without complications: Secondary | ICD-10-CM | POA: Diagnosis not present

## 2023-02-08 DIAGNOSIS — R41841 Cognitive communication deficit: Secondary | ICD-10-CM | POA: Diagnosis not present

## 2023-02-08 DIAGNOSIS — I5040 Unspecified combined systolic (congestive) and diastolic (congestive) heart failure: Secondary | ICD-10-CM | POA: Diagnosis not present

## 2023-02-08 DIAGNOSIS — E0840 Diabetes mellitus due to underlying condition with diabetic neuropathy, unspecified: Secondary | ICD-10-CM | POA: Diagnosis not present

## 2023-02-10 DIAGNOSIS — R41841 Cognitive communication deficit: Secondary | ICD-10-CM | POA: Diagnosis not present

## 2023-02-10 DIAGNOSIS — E0840 Diabetes mellitus due to underlying condition with diabetic neuropathy, unspecified: Secondary | ICD-10-CM | POA: Diagnosis not present

## 2023-02-10 DIAGNOSIS — I5040 Unspecified combined systolic (congestive) and diastolic (congestive) heart failure: Secondary | ICD-10-CM | POA: Diagnosis not present

## 2023-02-11 DIAGNOSIS — I5040 Unspecified combined systolic (congestive) and diastolic (congestive) heart failure: Secondary | ICD-10-CM | POA: Diagnosis not present

## 2023-02-11 DIAGNOSIS — F319 Bipolar disorder, unspecified: Secondary | ICD-10-CM | POA: Diagnosis not present

## 2023-02-11 DIAGNOSIS — E119 Type 2 diabetes mellitus without complications: Secondary | ICD-10-CM | POA: Diagnosis not present

## 2023-02-11 DIAGNOSIS — E0840 Diabetes mellitus due to underlying condition with diabetic neuropathy, unspecified: Secondary | ICD-10-CM | POA: Diagnosis not present

## 2023-02-11 DIAGNOSIS — I5043 Acute on chronic combined systolic (congestive) and diastolic (congestive) heart failure: Secondary | ICD-10-CM | POA: Diagnosis not present

## 2023-02-11 DIAGNOSIS — R41841 Cognitive communication deficit: Secondary | ICD-10-CM | POA: Diagnosis not present

## 2023-02-11 DIAGNOSIS — J449 Chronic obstructive pulmonary disease, unspecified: Secondary | ICD-10-CM | POA: Diagnosis not present

## 2023-02-12 DIAGNOSIS — R41841 Cognitive communication deficit: Secondary | ICD-10-CM | POA: Diagnosis not present

## 2023-02-12 DIAGNOSIS — I110 Hypertensive heart disease with heart failure: Secondary | ICD-10-CM | POA: Diagnosis not present

## 2023-02-12 DIAGNOSIS — F411 Generalized anxiety disorder: Secondary | ICD-10-CM | POA: Diagnosis not present

## 2023-02-12 DIAGNOSIS — G894 Chronic pain syndrome: Secondary | ICD-10-CM | POA: Diagnosis not present

## 2023-02-12 DIAGNOSIS — I5040 Unspecified combined systolic (congestive) and diastolic (congestive) heart failure: Secondary | ICD-10-CM | POA: Diagnosis not present

## 2023-02-12 DIAGNOSIS — I5022 Chronic systolic (congestive) heart failure: Secondary | ICD-10-CM | POA: Diagnosis not present

## 2023-02-12 DIAGNOSIS — E0840 Diabetes mellitus due to underlying condition with diabetic neuropathy, unspecified: Secondary | ICD-10-CM | POA: Diagnosis not present

## 2023-02-15 DIAGNOSIS — E0840 Diabetes mellitus due to underlying condition with diabetic neuropathy, unspecified: Secondary | ICD-10-CM | POA: Diagnosis not present

## 2023-02-15 DIAGNOSIS — I5040 Unspecified combined systolic (congestive) and diastolic (congestive) heart failure: Secondary | ICD-10-CM | POA: Diagnosis not present

## 2023-02-15 DIAGNOSIS — R41841 Cognitive communication deficit: Secondary | ICD-10-CM | POA: Diagnosis not present

## 2023-02-17 DIAGNOSIS — J449 Chronic obstructive pulmonary disease, unspecified: Secondary | ICD-10-CM | POA: Diagnosis not present

## 2023-02-17 DIAGNOSIS — G894 Chronic pain syndrome: Secondary | ICD-10-CM | POA: Diagnosis not present

## 2023-02-17 DIAGNOSIS — I5040 Unspecified combined systolic (congestive) and diastolic (congestive) heart failure: Secondary | ICD-10-CM | POA: Diagnosis not present

## 2023-02-17 DIAGNOSIS — R41841 Cognitive communication deficit: Secondary | ICD-10-CM | POA: Diagnosis not present

## 2023-02-17 DIAGNOSIS — E0840 Diabetes mellitus due to underlying condition with diabetic neuropathy, unspecified: Secondary | ICD-10-CM | POA: Diagnosis not present

## 2023-02-17 DIAGNOSIS — F411 Generalized anxiety disorder: Secondary | ICD-10-CM | POA: Diagnosis not present

## 2023-02-17 DIAGNOSIS — I5022 Chronic systolic (congestive) heart failure: Secondary | ICD-10-CM | POA: Diagnosis not present

## 2023-02-23 DIAGNOSIS — I5040 Unspecified combined systolic (congestive) and diastolic (congestive) heart failure: Secondary | ICD-10-CM | POA: Diagnosis not present

## 2023-02-23 DIAGNOSIS — E0840 Diabetes mellitus due to underlying condition with diabetic neuropathy, unspecified: Secondary | ICD-10-CM | POA: Diagnosis not present

## 2023-02-23 DIAGNOSIS — R41841 Cognitive communication deficit: Secondary | ICD-10-CM | POA: Diagnosis not present

## 2023-02-24 DIAGNOSIS — E0840 Diabetes mellitus due to underlying condition with diabetic neuropathy, unspecified: Secondary | ICD-10-CM | POA: Diagnosis not present

## 2023-02-24 DIAGNOSIS — I5040 Unspecified combined systolic (congestive) and diastolic (congestive) heart failure: Secondary | ICD-10-CM | POA: Diagnosis not present

## 2023-02-24 DIAGNOSIS — R41841 Cognitive communication deficit: Secondary | ICD-10-CM | POA: Diagnosis not present

## 2023-02-25 DIAGNOSIS — G894 Chronic pain syndrome: Secondary | ICD-10-CM | POA: Diagnosis not present

## 2023-02-25 DIAGNOSIS — E1142 Type 2 diabetes mellitus with diabetic polyneuropathy: Secondary | ICD-10-CM | POA: Diagnosis not present

## 2023-02-25 DIAGNOSIS — I5040 Unspecified combined systolic (congestive) and diastolic (congestive) heart failure: Secondary | ICD-10-CM | POA: Diagnosis not present

## 2023-02-25 DIAGNOSIS — I5022 Chronic systolic (congestive) heart failure: Secondary | ICD-10-CM | POA: Diagnosis not present

## 2023-02-25 DIAGNOSIS — E0840 Diabetes mellitus due to underlying condition with diabetic neuropathy, unspecified: Secondary | ICD-10-CM | POA: Diagnosis not present

## 2023-02-25 DIAGNOSIS — I110 Hypertensive heart disease with heart failure: Secondary | ICD-10-CM | POA: Diagnosis not present

## 2023-02-25 DIAGNOSIS — R41841 Cognitive communication deficit: Secondary | ICD-10-CM | POA: Diagnosis not present

## 2023-02-26 DIAGNOSIS — I5040 Unspecified combined systolic (congestive) and diastolic (congestive) heart failure: Secondary | ICD-10-CM | POA: Diagnosis not present

## 2023-02-26 DIAGNOSIS — E0840 Diabetes mellitus due to underlying condition with diabetic neuropathy, unspecified: Secondary | ICD-10-CM | POA: Diagnosis not present

## 2023-02-26 DIAGNOSIS — R41841 Cognitive communication deficit: Secondary | ICD-10-CM | POA: Diagnosis not present

## 2023-02-27 DIAGNOSIS — R059 Cough, unspecified: Secondary | ICD-10-CM | POA: Diagnosis not present

## 2023-02-27 DIAGNOSIS — R0602 Shortness of breath: Secondary | ICD-10-CM | POA: Diagnosis not present

## 2023-02-28 DIAGNOSIS — E1165 Type 2 diabetes mellitus with hyperglycemia: Secondary | ICD-10-CM | POA: Diagnosis not present

## 2023-02-28 DIAGNOSIS — Z794 Long term (current) use of insulin: Secondary | ICD-10-CM | POA: Diagnosis not present

## 2023-03-01 DIAGNOSIS — E0840 Diabetes mellitus due to underlying condition with diabetic neuropathy, unspecified: Secondary | ICD-10-CM | POA: Diagnosis not present

## 2023-03-01 DIAGNOSIS — I5040 Unspecified combined systolic (congestive) and diastolic (congestive) heart failure: Secondary | ICD-10-CM | POA: Diagnosis not present

## 2023-03-01 DIAGNOSIS — R41841 Cognitive communication deficit: Secondary | ICD-10-CM | POA: Diagnosis not present

## 2023-03-02 DIAGNOSIS — E0840 Diabetes mellitus due to underlying condition with diabetic neuropathy, unspecified: Secondary | ICD-10-CM | POA: Diagnosis not present

## 2023-03-02 DIAGNOSIS — R41841 Cognitive communication deficit: Secondary | ICD-10-CM | POA: Diagnosis not present

## 2023-03-02 DIAGNOSIS — I5040 Unspecified combined systolic (congestive) and diastolic (congestive) heart failure: Secondary | ICD-10-CM | POA: Diagnosis not present

## 2023-03-02 DIAGNOSIS — E119 Type 2 diabetes mellitus without complications: Secondary | ICD-10-CM | POA: Diagnosis not present

## 2023-03-02 DIAGNOSIS — I10 Essential (primary) hypertension: Secondary | ICD-10-CM | POA: Diagnosis not present

## 2023-03-02 DIAGNOSIS — F329 Major depressive disorder, single episode, unspecified: Secondary | ICD-10-CM | POA: Diagnosis not present

## 2023-03-02 DIAGNOSIS — I480 Paroxysmal atrial fibrillation: Secondary | ICD-10-CM | POA: Diagnosis not present

## 2023-03-05 DIAGNOSIS — J449 Chronic obstructive pulmonary disease, unspecified: Secondary | ICD-10-CM | POA: Diagnosis not present

## 2023-03-05 DIAGNOSIS — I5022 Chronic systolic (congestive) heart failure: Secondary | ICD-10-CM | POA: Diagnosis not present

## 2023-03-05 DIAGNOSIS — E1142 Type 2 diabetes mellitus with diabetic polyneuropathy: Secondary | ICD-10-CM | POA: Diagnosis not present

## 2023-03-05 DIAGNOSIS — G894 Chronic pain syndrome: Secondary | ICD-10-CM | POA: Diagnosis not present

## 2023-03-06 DIAGNOSIS — E119 Type 2 diabetes mellitus without complications: Secondary | ICD-10-CM | POA: Diagnosis not present

## 2023-03-08 DIAGNOSIS — I5040 Unspecified combined systolic (congestive) and diastolic (congestive) heart failure: Secondary | ICD-10-CM | POA: Diagnosis not present

## 2023-03-08 DIAGNOSIS — E0840 Diabetes mellitus due to underlying condition with diabetic neuropathy, unspecified: Secondary | ICD-10-CM | POA: Diagnosis not present

## 2023-03-08 DIAGNOSIS — R41841 Cognitive communication deficit: Secondary | ICD-10-CM | POA: Diagnosis not present

## 2023-03-09 DIAGNOSIS — F411 Generalized anxiety disorder: Secondary | ICD-10-CM | POA: Diagnosis not present

## 2023-03-09 DIAGNOSIS — F331 Major depressive disorder, recurrent, moderate: Secondary | ICD-10-CM | POA: Diagnosis not present

## 2023-03-09 DIAGNOSIS — E0840 Diabetes mellitus due to underlying condition with diabetic neuropathy, unspecified: Secondary | ICD-10-CM | POA: Diagnosis not present

## 2023-03-09 DIAGNOSIS — R41841 Cognitive communication deficit: Secondary | ICD-10-CM | POA: Diagnosis not present

## 2023-03-09 DIAGNOSIS — I5040 Unspecified combined systolic (congestive) and diastolic (congestive) heart failure: Secondary | ICD-10-CM | POA: Diagnosis not present

## 2023-03-11 DIAGNOSIS — E118 Type 2 diabetes mellitus with unspecified complications: Secondary | ICD-10-CM | POA: Diagnosis not present

## 2023-03-11 DIAGNOSIS — I10 Essential (primary) hypertension: Secondary | ICD-10-CM | POA: Diagnosis not present

## 2023-03-11 DIAGNOSIS — K5909 Other constipation: Secondary | ICD-10-CM | POA: Diagnosis not present

## 2023-03-11 DIAGNOSIS — J449 Chronic obstructive pulmonary disease, unspecified: Secondary | ICD-10-CM | POA: Diagnosis not present

## 2023-03-11 DIAGNOSIS — E1165 Type 2 diabetes mellitus with hyperglycemia: Secondary | ICD-10-CM | POA: Diagnosis not present

## 2023-03-12 DIAGNOSIS — G894 Chronic pain syndrome: Secondary | ICD-10-CM | POA: Diagnosis not present

## 2023-03-12 DIAGNOSIS — E1142 Type 2 diabetes mellitus with diabetic polyneuropathy: Secondary | ICD-10-CM | POA: Diagnosis not present

## 2023-03-12 DIAGNOSIS — F250 Schizoaffective disorder, bipolar type: Secondary | ICD-10-CM | POA: Diagnosis not present

## 2023-03-12 DIAGNOSIS — I110 Hypertensive heart disease with heart failure: Secondary | ICD-10-CM | POA: Diagnosis not present

## 2023-03-16 DIAGNOSIS — E1165 Type 2 diabetes mellitus with hyperglycemia: Secondary | ICD-10-CM | POA: Diagnosis not present

## 2023-03-18 DIAGNOSIS — I110 Hypertensive heart disease with heart failure: Secondary | ICD-10-CM | POA: Diagnosis not present

## 2023-03-18 DIAGNOSIS — G43009 Migraine without aura, not intractable, without status migrainosus: Secondary | ICD-10-CM | POA: Diagnosis not present

## 2023-03-18 DIAGNOSIS — E1142 Type 2 diabetes mellitus with diabetic polyneuropathy: Secondary | ICD-10-CM | POA: Diagnosis not present

## 2023-03-18 DIAGNOSIS — G894 Chronic pain syndrome: Secondary | ICD-10-CM | POA: Diagnosis not present

## 2023-03-23 DIAGNOSIS — J9611 Chronic respiratory failure with hypoxia: Secondary | ICD-10-CM | POA: Diagnosis not present

## 2023-03-23 DIAGNOSIS — I509 Heart failure, unspecified: Secondary | ICD-10-CM | POA: Diagnosis not present

## 2023-03-23 DIAGNOSIS — J449 Chronic obstructive pulmonary disease, unspecified: Secondary | ICD-10-CM | POA: Diagnosis not present

## 2023-03-23 DIAGNOSIS — I5022 Chronic systolic (congestive) heart failure: Secondary | ICD-10-CM | POA: Diagnosis not present

## 2023-03-23 DIAGNOSIS — I10 Essential (primary) hypertension: Secondary | ICD-10-CM | POA: Diagnosis not present

## 2023-03-23 DIAGNOSIS — E1142 Type 2 diabetes mellitus with diabetic polyneuropathy: Secondary | ICD-10-CM | POA: Diagnosis not present

## 2023-03-26 DIAGNOSIS — I10 Essential (primary) hypertension: Secondary | ICD-10-CM | POA: Diagnosis not present

## 2023-03-26 DIAGNOSIS — E118 Type 2 diabetes mellitus with unspecified complications: Secondary | ICD-10-CM | POA: Diagnosis not present

## 2023-03-26 DIAGNOSIS — F411 Generalized anxiety disorder: Secondary | ICD-10-CM | POA: Diagnosis not present

## 2023-03-26 DIAGNOSIS — G894 Chronic pain syndrome: Secondary | ICD-10-CM | POA: Diagnosis not present

## 2023-03-26 DIAGNOSIS — I480 Paroxysmal atrial fibrillation: Secondary | ICD-10-CM | POA: Diagnosis not present

## 2023-03-26 DIAGNOSIS — E119 Type 2 diabetes mellitus without complications: Secondary | ICD-10-CM | POA: Diagnosis not present

## 2023-03-26 DIAGNOSIS — E785 Hyperlipidemia, unspecified: Secondary | ICD-10-CM | POA: Diagnosis not present

## 2023-03-26 DIAGNOSIS — I110 Hypertensive heart disease with heart failure: Secondary | ICD-10-CM | POA: Diagnosis not present

## 2023-03-26 DIAGNOSIS — E1142 Type 2 diabetes mellitus with diabetic polyneuropathy: Secondary | ICD-10-CM | POA: Diagnosis not present

## 2023-04-05 DIAGNOSIS — G894 Chronic pain syndrome: Secondary | ICD-10-CM | POA: Diagnosis not present

## 2023-04-05 DIAGNOSIS — R0989 Other specified symptoms and signs involving the circulatory and respiratory systems: Secondary | ICD-10-CM | POA: Diagnosis not present

## 2023-04-05 DIAGNOSIS — I110 Hypertensive heart disease with heart failure: Secondary | ICD-10-CM | POA: Diagnosis not present

## 2023-04-05 DIAGNOSIS — I5022 Chronic systolic (congestive) heart failure: Secondary | ICD-10-CM | POA: Diagnosis not present

## 2023-04-05 DIAGNOSIS — E1142 Type 2 diabetes mellitus with diabetic polyneuropathy: Secondary | ICD-10-CM | POA: Diagnosis not present

## 2023-04-05 DIAGNOSIS — R0602 Shortness of breath: Secondary | ICD-10-CM | POA: Diagnosis not present

## 2023-04-06 DIAGNOSIS — G894 Chronic pain syndrome: Secondary | ICD-10-CM | POA: Diagnosis not present

## 2023-04-06 DIAGNOSIS — E119 Type 2 diabetes mellitus without complications: Secondary | ICD-10-CM | POA: Diagnosis not present

## 2023-04-06 DIAGNOSIS — I5022 Chronic systolic (congestive) heart failure: Secondary | ICD-10-CM | POA: Diagnosis not present

## 2023-04-06 DIAGNOSIS — R0602 Shortness of breath: Secondary | ICD-10-CM | POA: Diagnosis not present

## 2023-04-06 DIAGNOSIS — R059 Cough, unspecified: Secondary | ICD-10-CM | POA: Diagnosis not present

## 2023-04-06 DIAGNOSIS — E1142 Type 2 diabetes mellitus with diabetic polyneuropathy: Secondary | ICD-10-CM | POA: Diagnosis not present

## 2023-04-07 DIAGNOSIS — J42 Unspecified chronic bronchitis: Secondary | ICD-10-CM | POA: Diagnosis not present

## 2023-04-07 DIAGNOSIS — J9611 Chronic respiratory failure with hypoxia: Secondary | ICD-10-CM | POA: Diagnosis not present

## 2023-04-07 DIAGNOSIS — J449 Chronic obstructive pulmonary disease, unspecified: Secondary | ICD-10-CM | POA: Diagnosis not present

## 2023-04-07 DIAGNOSIS — N649 Disorder of breast, unspecified: Secondary | ICD-10-CM | POA: Diagnosis not present

## 2023-04-13 DIAGNOSIS — N390 Urinary tract infection, site not specified: Secondary | ICD-10-CM | POA: Diagnosis not present

## 2023-04-13 DIAGNOSIS — R338 Other retention of urine: Secondary | ICD-10-CM | POA: Diagnosis not present

## 2023-04-15 DIAGNOSIS — L603 Nail dystrophy: Secondary | ICD-10-CM | POA: Diagnosis not present

## 2023-04-15 DIAGNOSIS — E1151 Type 2 diabetes mellitus with diabetic peripheral angiopathy without gangrene: Secondary | ICD-10-CM | POA: Diagnosis not present

## 2023-04-15 DIAGNOSIS — L602 Onychogryphosis: Secondary | ICD-10-CM | POA: Diagnosis not present

## 2023-04-15 DIAGNOSIS — Z794 Long term (current) use of insulin: Secondary | ICD-10-CM | POA: Diagnosis not present

## 2023-04-21 DIAGNOSIS — I480 Paroxysmal atrial fibrillation: Secondary | ICD-10-CM | POA: Diagnosis not present

## 2023-04-21 DIAGNOSIS — E785 Hyperlipidemia, unspecified: Secondary | ICD-10-CM | POA: Diagnosis not present

## 2023-04-21 DIAGNOSIS — E119 Type 2 diabetes mellitus without complications: Secondary | ICD-10-CM | POA: Diagnosis not present

## 2023-04-21 DIAGNOSIS — I10 Essential (primary) hypertension: Secondary | ICD-10-CM | POA: Diagnosis not present

## 2023-04-27 DIAGNOSIS — F319 Bipolar disorder, unspecified: Secondary | ICD-10-CM | POA: Diagnosis not present

## 2023-04-27 DIAGNOSIS — F4323 Adjustment disorder with mixed anxiety and depressed mood: Secondary | ICD-10-CM | POA: Diagnosis not present

## 2023-04-27 DIAGNOSIS — Z720 Tobacco use: Secondary | ICD-10-CM | POA: Diagnosis not present

## 2023-04-27 DIAGNOSIS — F329 Major depressive disorder, single episode, unspecified: Secondary | ICD-10-CM | POA: Diagnosis not present

## 2023-04-27 DIAGNOSIS — F419 Anxiety disorder, unspecified: Secondary | ICD-10-CM | POA: Diagnosis not present

## 2023-04-27 DIAGNOSIS — F250 Schizoaffective disorder, bipolar type: Secondary | ICD-10-CM | POA: Diagnosis not present

## 2023-04-29 ENCOUNTER — Telehealth (HOSPITAL_COMMUNITY): Payer: Self-pay | Admitting: Vascular Surgery

## 2023-04-29 DIAGNOSIS — I5043 Acute on chronic combined systolic (congestive) and diastolic (congestive) heart failure: Secondary | ICD-10-CM | POA: Diagnosis not present

## 2023-04-29 DIAGNOSIS — E1165 Type 2 diabetes mellitus with hyperglycemia: Secondary | ICD-10-CM | POA: Diagnosis not present

## 2023-04-29 DIAGNOSIS — J449 Chronic obstructive pulmonary disease, unspecified: Secondary | ICD-10-CM | POA: Diagnosis not present

## 2023-04-29 DIAGNOSIS — I10 Essential (primary) hypertension: Secondary | ICD-10-CM | POA: Diagnosis not present

## 2023-04-29 NOTE — Telephone Encounter (Signed)
 Lvm @ [facility] pt is not appropriate for the advance hf clinic

## 2023-04-30 DIAGNOSIS — E1142 Type 2 diabetes mellitus with diabetic polyneuropathy: Secondary | ICD-10-CM | POA: Diagnosis not present

## 2023-04-30 DIAGNOSIS — I110 Hypertensive heart disease with heart failure: Secondary | ICD-10-CM | POA: Diagnosis not present

## 2023-04-30 DIAGNOSIS — G894 Chronic pain syndrome: Secondary | ICD-10-CM | POA: Diagnosis not present

## 2023-04-30 DIAGNOSIS — I5022 Chronic systolic (congestive) heart failure: Secondary | ICD-10-CM | POA: Diagnosis not present

## 2023-05-04 DIAGNOSIS — R2689 Other abnormalities of gait and mobility: Secondary | ICD-10-CM | POA: Diagnosis not present

## 2023-05-04 DIAGNOSIS — I5040 Unspecified combined systolic (congestive) and diastolic (congestive) heart failure: Secondary | ICD-10-CM | POA: Diagnosis not present

## 2023-05-04 DIAGNOSIS — M6281 Muscle weakness (generalized): Secondary | ICD-10-CM | POA: Diagnosis not present

## 2023-05-04 DIAGNOSIS — M25561 Pain in right knee: Secondary | ICD-10-CM | POA: Diagnosis not present

## 2023-05-04 DIAGNOSIS — E0840 Diabetes mellitus due to underlying condition with diabetic neuropathy, unspecified: Secondary | ICD-10-CM | POA: Diagnosis not present

## 2023-05-05 DIAGNOSIS — M25561 Pain in right knee: Secondary | ICD-10-CM | POA: Diagnosis not present

## 2023-05-05 DIAGNOSIS — M6281 Muscle weakness (generalized): Secondary | ICD-10-CM | POA: Diagnosis not present

## 2023-05-05 DIAGNOSIS — E0840 Diabetes mellitus due to underlying condition with diabetic neuropathy, unspecified: Secondary | ICD-10-CM | POA: Diagnosis not present

## 2023-05-05 DIAGNOSIS — I5040 Unspecified combined systolic (congestive) and diastolic (congestive) heart failure: Secondary | ICD-10-CM | POA: Diagnosis not present

## 2023-05-05 DIAGNOSIS — R2689 Other abnormalities of gait and mobility: Secondary | ICD-10-CM | POA: Diagnosis not present

## 2023-05-06 DIAGNOSIS — M25561 Pain in right knee: Secondary | ICD-10-CM | POA: Diagnosis not present

## 2023-05-06 DIAGNOSIS — R2689 Other abnormalities of gait and mobility: Secondary | ICD-10-CM | POA: Diagnosis not present

## 2023-05-06 DIAGNOSIS — E0840 Diabetes mellitus due to underlying condition with diabetic neuropathy, unspecified: Secondary | ICD-10-CM | POA: Diagnosis not present

## 2023-05-06 DIAGNOSIS — E119 Type 2 diabetes mellitus without complications: Secondary | ICD-10-CM | POA: Diagnosis not present

## 2023-05-06 DIAGNOSIS — M6281 Muscle weakness (generalized): Secondary | ICD-10-CM | POA: Diagnosis not present

## 2023-05-06 DIAGNOSIS — I5040 Unspecified combined systolic (congestive) and diastolic (congestive) heart failure: Secondary | ICD-10-CM | POA: Diagnosis not present

## 2023-05-07 DIAGNOSIS — E1142 Type 2 diabetes mellitus with diabetic polyneuropathy: Secondary | ICD-10-CM | POA: Diagnosis not present

## 2023-05-07 DIAGNOSIS — I5022 Chronic systolic (congestive) heart failure: Secondary | ICD-10-CM | POA: Diagnosis not present

## 2023-05-07 DIAGNOSIS — G894 Chronic pain syndrome: Secondary | ICD-10-CM | POA: Diagnosis not present

## 2023-05-07 DIAGNOSIS — I110 Hypertensive heart disease with heart failure: Secondary | ICD-10-CM | POA: Diagnosis not present

## 2023-05-08 DIAGNOSIS — R2689 Other abnormalities of gait and mobility: Secondary | ICD-10-CM | POA: Diagnosis not present

## 2023-05-08 DIAGNOSIS — M6281 Muscle weakness (generalized): Secondary | ICD-10-CM | POA: Diagnosis not present

## 2023-05-08 DIAGNOSIS — E0840 Diabetes mellitus due to underlying condition with diabetic neuropathy, unspecified: Secondary | ICD-10-CM | POA: Diagnosis not present

## 2023-05-08 DIAGNOSIS — M25561 Pain in right knee: Secondary | ICD-10-CM | POA: Diagnosis not present

## 2023-05-08 DIAGNOSIS — I5040 Unspecified combined systolic (congestive) and diastolic (congestive) heart failure: Secondary | ICD-10-CM | POA: Diagnosis not present

## 2023-05-09 DIAGNOSIS — E0840 Diabetes mellitus due to underlying condition with diabetic neuropathy, unspecified: Secondary | ICD-10-CM | POA: Diagnosis not present

## 2023-05-09 DIAGNOSIS — I5040 Unspecified combined systolic (congestive) and diastolic (congestive) heart failure: Secondary | ICD-10-CM | POA: Diagnosis not present

## 2023-05-09 DIAGNOSIS — R2689 Other abnormalities of gait and mobility: Secondary | ICD-10-CM | POA: Diagnosis not present

## 2023-05-09 DIAGNOSIS — M25561 Pain in right knee: Secondary | ICD-10-CM | POA: Diagnosis not present

## 2023-05-09 DIAGNOSIS — M6281 Muscle weakness (generalized): Secondary | ICD-10-CM | POA: Diagnosis not present

## 2023-05-14 DIAGNOSIS — I5022 Chronic systolic (congestive) heart failure: Secondary | ICD-10-CM | POA: Diagnosis not present

## 2023-05-14 DIAGNOSIS — R2689 Other abnormalities of gait and mobility: Secondary | ICD-10-CM | POA: Diagnosis not present

## 2023-05-14 DIAGNOSIS — E0840 Diabetes mellitus due to underlying condition with diabetic neuropathy, unspecified: Secondary | ICD-10-CM | POA: Diagnosis not present

## 2023-05-14 DIAGNOSIS — I110 Hypertensive heart disease with heart failure: Secondary | ICD-10-CM | POA: Diagnosis not present

## 2023-05-14 DIAGNOSIS — I5040 Unspecified combined systolic (congestive) and diastolic (congestive) heart failure: Secondary | ICD-10-CM | POA: Diagnosis not present

## 2023-05-14 DIAGNOSIS — M6281 Muscle weakness (generalized): Secondary | ICD-10-CM | POA: Diagnosis not present

## 2023-05-14 DIAGNOSIS — E1142 Type 2 diabetes mellitus with diabetic polyneuropathy: Secondary | ICD-10-CM | POA: Diagnosis not present

## 2023-05-14 DIAGNOSIS — G894 Chronic pain syndrome: Secondary | ICD-10-CM | POA: Diagnosis not present

## 2023-05-14 DIAGNOSIS — M25561 Pain in right knee: Secondary | ICD-10-CM | POA: Diagnosis not present

## 2023-05-15 DIAGNOSIS — R2689 Other abnormalities of gait and mobility: Secondary | ICD-10-CM | POA: Diagnosis not present

## 2023-05-15 DIAGNOSIS — I5040 Unspecified combined systolic (congestive) and diastolic (congestive) heart failure: Secondary | ICD-10-CM | POA: Diagnosis not present

## 2023-05-15 DIAGNOSIS — E0840 Diabetes mellitus due to underlying condition with diabetic neuropathy, unspecified: Secondary | ICD-10-CM | POA: Diagnosis not present

## 2023-05-15 DIAGNOSIS — M25561 Pain in right knee: Secondary | ICD-10-CM | POA: Diagnosis not present

## 2023-05-15 DIAGNOSIS — M6281 Muscle weakness (generalized): Secondary | ICD-10-CM | POA: Diagnosis not present

## 2023-05-16 DIAGNOSIS — I5040 Unspecified combined systolic (congestive) and diastolic (congestive) heart failure: Secondary | ICD-10-CM | POA: Diagnosis not present

## 2023-05-16 DIAGNOSIS — R2689 Other abnormalities of gait and mobility: Secondary | ICD-10-CM | POA: Diagnosis not present

## 2023-05-16 DIAGNOSIS — E0840 Diabetes mellitus due to underlying condition with diabetic neuropathy, unspecified: Secondary | ICD-10-CM | POA: Diagnosis not present

## 2023-05-16 DIAGNOSIS — M6281 Muscle weakness (generalized): Secondary | ICD-10-CM | POA: Diagnosis not present

## 2023-05-16 DIAGNOSIS — M25561 Pain in right knee: Secondary | ICD-10-CM | POA: Diagnosis not present

## 2023-05-17 ENCOUNTER — Other Ambulatory Visit: Payer: Self-pay | Admitting: Internal Medicine

## 2023-05-17 DIAGNOSIS — I480 Paroxysmal atrial fibrillation: Secondary | ICD-10-CM | POA: Diagnosis not present

## 2023-05-17 DIAGNOSIS — N6452 Nipple discharge: Secondary | ICD-10-CM

## 2023-05-17 DIAGNOSIS — I5040 Unspecified combined systolic (congestive) and diastolic (congestive) heart failure: Secondary | ICD-10-CM | POA: Diagnosis not present

## 2023-05-17 DIAGNOSIS — R2689 Other abnormalities of gait and mobility: Secondary | ICD-10-CM | POA: Diagnosis not present

## 2023-05-17 DIAGNOSIS — I10 Essential (primary) hypertension: Secondary | ICD-10-CM | POA: Diagnosis not present

## 2023-05-17 DIAGNOSIS — E119 Type 2 diabetes mellitus without complications: Secondary | ICD-10-CM | POA: Diagnosis not present

## 2023-05-17 DIAGNOSIS — E0840 Diabetes mellitus due to underlying condition with diabetic neuropathy, unspecified: Secondary | ICD-10-CM | POA: Diagnosis not present

## 2023-05-17 DIAGNOSIS — M25561 Pain in right knee: Secondary | ICD-10-CM | POA: Diagnosis not present

## 2023-05-17 DIAGNOSIS — M6281 Muscle weakness (generalized): Secondary | ICD-10-CM | POA: Diagnosis not present

## 2023-05-17 DIAGNOSIS — E785 Hyperlipidemia, unspecified: Secondary | ICD-10-CM | POA: Diagnosis not present

## 2023-05-18 DIAGNOSIS — M6281 Muscle weakness (generalized): Secondary | ICD-10-CM | POA: Diagnosis not present

## 2023-05-18 DIAGNOSIS — I5040 Unspecified combined systolic (congestive) and diastolic (congestive) heart failure: Secondary | ICD-10-CM | POA: Diagnosis not present

## 2023-05-18 DIAGNOSIS — M25561 Pain in right knee: Secondary | ICD-10-CM | POA: Diagnosis not present

## 2023-05-18 DIAGNOSIS — E0840 Diabetes mellitus due to underlying condition with diabetic neuropathy, unspecified: Secondary | ICD-10-CM | POA: Diagnosis not present

## 2023-05-18 DIAGNOSIS — R2689 Other abnormalities of gait and mobility: Secondary | ICD-10-CM | POA: Diagnosis not present

## 2023-05-20 DIAGNOSIS — R2689 Other abnormalities of gait and mobility: Secondary | ICD-10-CM | POA: Diagnosis not present

## 2023-05-20 DIAGNOSIS — I5040 Unspecified combined systolic (congestive) and diastolic (congestive) heart failure: Secondary | ICD-10-CM | POA: Diagnosis not present

## 2023-05-20 DIAGNOSIS — M25561 Pain in right knee: Secondary | ICD-10-CM | POA: Diagnosis not present

## 2023-05-20 DIAGNOSIS — E0840 Diabetes mellitus due to underlying condition with diabetic neuropathy, unspecified: Secondary | ICD-10-CM | POA: Diagnosis not present

## 2023-05-20 DIAGNOSIS — M6281 Muscle weakness (generalized): Secondary | ICD-10-CM | POA: Diagnosis not present

## 2023-05-21 DIAGNOSIS — M6281 Muscle weakness (generalized): Secondary | ICD-10-CM | POA: Diagnosis not present

## 2023-05-21 DIAGNOSIS — E0840 Diabetes mellitus due to underlying condition with diabetic neuropathy, unspecified: Secondary | ICD-10-CM | POA: Diagnosis not present

## 2023-05-21 DIAGNOSIS — I5040 Unspecified combined systolic (congestive) and diastolic (congestive) heart failure: Secondary | ICD-10-CM | POA: Diagnosis not present

## 2023-05-21 DIAGNOSIS — R2689 Other abnormalities of gait and mobility: Secondary | ICD-10-CM | POA: Diagnosis not present

## 2023-05-21 DIAGNOSIS — M25561 Pain in right knee: Secondary | ICD-10-CM | POA: Diagnosis not present

## 2023-05-24 DIAGNOSIS — M6281 Muscle weakness (generalized): Secondary | ICD-10-CM | POA: Diagnosis not present

## 2023-05-24 DIAGNOSIS — R2689 Other abnormalities of gait and mobility: Secondary | ICD-10-CM | POA: Diagnosis not present

## 2023-05-24 DIAGNOSIS — M25561 Pain in right knee: Secondary | ICD-10-CM | POA: Diagnosis not present

## 2023-05-24 DIAGNOSIS — I5040 Unspecified combined systolic (congestive) and diastolic (congestive) heart failure: Secondary | ICD-10-CM | POA: Diagnosis not present

## 2023-05-24 DIAGNOSIS — E0840 Diabetes mellitus due to underlying condition with diabetic neuropathy, unspecified: Secondary | ICD-10-CM | POA: Diagnosis not present

## 2023-05-25 DIAGNOSIS — R2689 Other abnormalities of gait and mobility: Secondary | ICD-10-CM | POA: Diagnosis not present

## 2023-05-25 DIAGNOSIS — M25561 Pain in right knee: Secondary | ICD-10-CM | POA: Diagnosis not present

## 2023-05-25 DIAGNOSIS — M6281 Muscle weakness (generalized): Secondary | ICD-10-CM | POA: Diagnosis not present

## 2023-05-25 DIAGNOSIS — I5040 Unspecified combined systolic (congestive) and diastolic (congestive) heart failure: Secondary | ICD-10-CM | POA: Diagnosis not present

## 2023-05-25 DIAGNOSIS — E0840 Diabetes mellitus due to underlying condition with diabetic neuropathy, unspecified: Secondary | ICD-10-CM | POA: Diagnosis not present

## 2023-05-26 DIAGNOSIS — M6281 Muscle weakness (generalized): Secondary | ICD-10-CM | POA: Diagnosis not present

## 2023-05-26 DIAGNOSIS — I5040 Unspecified combined systolic (congestive) and diastolic (congestive) heart failure: Secondary | ICD-10-CM | POA: Diagnosis not present

## 2023-05-26 DIAGNOSIS — E1142 Type 2 diabetes mellitus with diabetic polyneuropathy: Secondary | ICD-10-CM | POA: Diagnosis not present

## 2023-05-26 DIAGNOSIS — I110 Hypertensive heart disease with heart failure: Secondary | ICD-10-CM | POA: Diagnosis not present

## 2023-05-26 DIAGNOSIS — M25561 Pain in right knee: Secondary | ICD-10-CM | POA: Diagnosis not present

## 2023-05-26 DIAGNOSIS — J9611 Chronic respiratory failure with hypoxia: Secondary | ICD-10-CM | POA: Diagnosis not present

## 2023-05-26 DIAGNOSIS — I5022 Chronic systolic (congestive) heart failure: Secondary | ICD-10-CM | POA: Diagnosis not present

## 2023-05-26 DIAGNOSIS — E0840 Diabetes mellitus due to underlying condition with diabetic neuropathy, unspecified: Secondary | ICD-10-CM | POA: Diagnosis not present

## 2023-05-26 DIAGNOSIS — R2689 Other abnormalities of gait and mobility: Secondary | ICD-10-CM | POA: Diagnosis not present

## 2023-05-27 DIAGNOSIS — R2689 Other abnormalities of gait and mobility: Secondary | ICD-10-CM | POA: Diagnosis not present

## 2023-05-27 DIAGNOSIS — M25561 Pain in right knee: Secondary | ICD-10-CM | POA: Diagnosis not present

## 2023-05-27 DIAGNOSIS — M6281 Muscle weakness (generalized): Secondary | ICD-10-CM | POA: Diagnosis not present

## 2023-05-27 DIAGNOSIS — E0840 Diabetes mellitus due to underlying condition with diabetic neuropathy, unspecified: Secondary | ICD-10-CM | POA: Diagnosis not present

## 2023-05-27 DIAGNOSIS — I5040 Unspecified combined systolic (congestive) and diastolic (congestive) heart failure: Secondary | ICD-10-CM | POA: Diagnosis not present

## 2023-05-28 DIAGNOSIS — I5040 Unspecified combined systolic (congestive) and diastolic (congestive) heart failure: Secondary | ICD-10-CM | POA: Diagnosis not present

## 2023-05-28 DIAGNOSIS — E0840 Diabetes mellitus due to underlying condition with diabetic neuropathy, unspecified: Secondary | ICD-10-CM | POA: Diagnosis not present

## 2023-05-28 DIAGNOSIS — M6281 Muscle weakness (generalized): Secondary | ICD-10-CM | POA: Diagnosis not present

## 2023-05-28 DIAGNOSIS — R2689 Other abnormalities of gait and mobility: Secondary | ICD-10-CM | POA: Diagnosis not present

## 2023-05-28 DIAGNOSIS — M25561 Pain in right knee: Secondary | ICD-10-CM | POA: Diagnosis not present

## 2023-05-31 DIAGNOSIS — R2689 Other abnormalities of gait and mobility: Secondary | ICD-10-CM | POA: Diagnosis not present

## 2023-05-31 DIAGNOSIS — M6281 Muscle weakness (generalized): Secondary | ICD-10-CM | POA: Diagnosis not present

## 2023-05-31 DIAGNOSIS — I5040 Unspecified combined systolic (congestive) and diastolic (congestive) heart failure: Secondary | ICD-10-CM | POA: Diagnosis not present

## 2023-05-31 DIAGNOSIS — E0840 Diabetes mellitus due to underlying condition with diabetic neuropathy, unspecified: Secondary | ICD-10-CM | POA: Diagnosis not present

## 2023-05-31 DIAGNOSIS — M25561 Pain in right knee: Secondary | ICD-10-CM | POA: Diagnosis not present

## 2023-06-01 DIAGNOSIS — Z720 Tobacco use: Secondary | ICD-10-CM | POA: Diagnosis not present

## 2023-06-01 DIAGNOSIS — F329 Major depressive disorder, single episode, unspecified: Secondary | ICD-10-CM | POA: Diagnosis not present

## 2023-06-01 DIAGNOSIS — G4700 Insomnia, unspecified: Secondary | ICD-10-CM | POA: Diagnosis not present

## 2023-06-01 DIAGNOSIS — M6281 Muscle weakness (generalized): Secondary | ICD-10-CM | POA: Diagnosis not present

## 2023-06-01 DIAGNOSIS — I5040 Unspecified combined systolic (congestive) and diastolic (congestive) heart failure: Secondary | ICD-10-CM | POA: Diagnosis not present

## 2023-06-01 DIAGNOSIS — R2689 Other abnormalities of gait and mobility: Secondary | ICD-10-CM | POA: Diagnosis not present

## 2023-06-01 DIAGNOSIS — M25561 Pain in right knee: Secondary | ICD-10-CM | POA: Diagnosis not present

## 2023-06-01 DIAGNOSIS — F419 Anxiety disorder, unspecified: Secondary | ICD-10-CM | POA: Diagnosis not present

## 2023-06-01 DIAGNOSIS — E0840 Diabetes mellitus due to underlying condition with diabetic neuropathy, unspecified: Secondary | ICD-10-CM | POA: Diagnosis not present

## 2023-06-02 DIAGNOSIS — I5040 Unspecified combined systolic (congestive) and diastolic (congestive) heart failure: Secondary | ICD-10-CM | POA: Diagnosis not present

## 2023-06-02 DIAGNOSIS — M6281 Muscle weakness (generalized): Secondary | ICD-10-CM | POA: Diagnosis not present

## 2023-06-02 DIAGNOSIS — E0840 Diabetes mellitus due to underlying condition with diabetic neuropathy, unspecified: Secondary | ICD-10-CM | POA: Diagnosis not present

## 2023-06-02 DIAGNOSIS — M25561 Pain in right knee: Secondary | ICD-10-CM | POA: Diagnosis not present

## 2023-06-02 DIAGNOSIS — R2689 Other abnormalities of gait and mobility: Secondary | ICD-10-CM | POA: Diagnosis not present

## 2023-06-03 ENCOUNTER — Ambulatory Visit
Admission: RE | Admit: 2023-06-03 | Discharge: 2023-06-03 | Disposition: A | Discharge status: Home / Self Care | Source: Ambulatory Visit | Attending: Internal Medicine | Admitting: Internal Medicine

## 2023-06-03 DIAGNOSIS — M6281 Muscle weakness (generalized): Secondary | ICD-10-CM | POA: Diagnosis not present

## 2023-06-03 DIAGNOSIS — I5043 Acute on chronic combined systolic (congestive) and diastolic (congestive) heart failure: Secondary | ICD-10-CM | POA: Diagnosis not present

## 2023-06-03 DIAGNOSIS — N6452 Nipple discharge: Secondary | ICD-10-CM

## 2023-06-03 DIAGNOSIS — M25561 Pain in right knee: Secondary | ICD-10-CM | POA: Diagnosis not present

## 2023-06-03 DIAGNOSIS — I5040 Unspecified combined systolic (congestive) and diastolic (congestive) heart failure: Secondary | ICD-10-CM | POA: Diagnosis not present

## 2023-06-03 DIAGNOSIS — J449 Chronic obstructive pulmonary disease, unspecified: Secondary | ICD-10-CM | POA: Diagnosis not present

## 2023-06-03 DIAGNOSIS — E0840 Diabetes mellitus due to underlying condition with diabetic neuropathy, unspecified: Secondary | ICD-10-CM | POA: Diagnosis not present

## 2023-06-03 DIAGNOSIS — R2689 Other abnormalities of gait and mobility: Secondary | ICD-10-CM | POA: Diagnosis not present

## 2023-06-03 DIAGNOSIS — I10 Essential (primary) hypertension: Secondary | ICD-10-CM | POA: Diagnosis not present

## 2023-06-03 DIAGNOSIS — E1165 Type 2 diabetes mellitus with hyperglycemia: Secondary | ICD-10-CM | POA: Diagnosis not present

## 2023-06-04 DIAGNOSIS — E0840 Diabetes mellitus due to underlying condition with diabetic neuropathy, unspecified: Secondary | ICD-10-CM | POA: Diagnosis not present

## 2023-06-04 DIAGNOSIS — M6281 Muscle weakness (generalized): Secondary | ICD-10-CM | POA: Diagnosis not present

## 2023-06-04 DIAGNOSIS — I5040 Unspecified combined systolic (congestive) and diastolic (congestive) heart failure: Secondary | ICD-10-CM | POA: Diagnosis not present

## 2023-06-04 DIAGNOSIS — M25561 Pain in right knee: Secondary | ICD-10-CM | POA: Diagnosis not present

## 2023-06-04 DIAGNOSIS — R2689 Other abnormalities of gait and mobility: Secondary | ICD-10-CM | POA: Diagnosis not present

## 2023-06-05 DIAGNOSIS — I5040 Unspecified combined systolic (congestive) and diastolic (congestive) heart failure: Secondary | ICD-10-CM | POA: Diagnosis not present

## 2023-06-05 DIAGNOSIS — E0840 Diabetes mellitus due to underlying condition with diabetic neuropathy, unspecified: Secondary | ICD-10-CM | POA: Diagnosis not present

## 2023-06-05 DIAGNOSIS — M6281 Muscle weakness (generalized): Secondary | ICD-10-CM | POA: Diagnosis not present

## 2023-06-05 DIAGNOSIS — R2689 Other abnormalities of gait and mobility: Secondary | ICD-10-CM | POA: Diagnosis not present

## 2023-06-05 DIAGNOSIS — M25561 Pain in right knee: Secondary | ICD-10-CM | POA: Diagnosis not present

## 2023-06-06 DIAGNOSIS — E119 Type 2 diabetes mellitus without complications: Secondary | ICD-10-CM | POA: Diagnosis not present

## 2023-06-07 DIAGNOSIS — E0840 Diabetes mellitus due to underlying condition with diabetic neuropathy, unspecified: Secondary | ICD-10-CM | POA: Diagnosis not present

## 2023-06-07 DIAGNOSIS — K5900 Constipation, unspecified: Secondary | ICD-10-CM | POA: Diagnosis not present

## 2023-06-07 DIAGNOSIS — I10 Essential (primary) hypertension: Secondary | ICD-10-CM | POA: Diagnosis not present

## 2023-06-07 DIAGNOSIS — J449 Chronic obstructive pulmonary disease, unspecified: Secondary | ICD-10-CM | POA: Diagnosis not present

## 2023-06-08 DIAGNOSIS — M25561 Pain in right knee: Secondary | ICD-10-CM | POA: Diagnosis not present

## 2023-06-08 DIAGNOSIS — E0840 Diabetes mellitus due to underlying condition with diabetic neuropathy, unspecified: Secondary | ICD-10-CM | POA: Diagnosis not present

## 2023-06-08 DIAGNOSIS — I5040 Unspecified combined systolic (congestive) and diastolic (congestive) heart failure: Secondary | ICD-10-CM | POA: Diagnosis not present

## 2023-06-08 DIAGNOSIS — R2689 Other abnormalities of gait and mobility: Secondary | ICD-10-CM | POA: Diagnosis not present

## 2023-06-08 DIAGNOSIS — M6281 Muscle weakness (generalized): Secondary | ICD-10-CM | POA: Diagnosis not present

## 2023-06-09 DIAGNOSIS — E0840 Diabetes mellitus due to underlying condition with diabetic neuropathy, unspecified: Secondary | ICD-10-CM | POA: Diagnosis not present

## 2023-06-09 DIAGNOSIS — R2689 Other abnormalities of gait and mobility: Secondary | ICD-10-CM | POA: Diagnosis not present

## 2023-06-09 DIAGNOSIS — M25561 Pain in right knee: Secondary | ICD-10-CM | POA: Diagnosis not present

## 2023-06-09 DIAGNOSIS — E785 Hyperlipidemia, unspecified: Secondary | ICD-10-CM | POA: Diagnosis not present

## 2023-06-09 DIAGNOSIS — E119 Type 2 diabetes mellitus without complications: Secondary | ICD-10-CM | POA: Diagnosis not present

## 2023-06-09 DIAGNOSIS — I10 Essential (primary) hypertension: Secondary | ICD-10-CM | POA: Diagnosis not present

## 2023-06-09 DIAGNOSIS — I5040 Unspecified combined systolic (congestive) and diastolic (congestive) heart failure: Secondary | ICD-10-CM | POA: Diagnosis not present

## 2023-06-09 DIAGNOSIS — I480 Paroxysmal atrial fibrillation: Secondary | ICD-10-CM | POA: Diagnosis not present

## 2023-06-09 DIAGNOSIS — M6281 Muscle weakness (generalized): Secondary | ICD-10-CM | POA: Diagnosis not present

## 2023-06-10 DIAGNOSIS — M6281 Muscle weakness (generalized): Secondary | ICD-10-CM | POA: Diagnosis not present

## 2023-06-10 DIAGNOSIS — E0840 Diabetes mellitus due to underlying condition with diabetic neuropathy, unspecified: Secondary | ICD-10-CM | POA: Diagnosis not present

## 2023-06-10 DIAGNOSIS — M25561 Pain in right knee: Secondary | ICD-10-CM | POA: Diagnosis not present

## 2023-06-10 DIAGNOSIS — I5040 Unspecified combined systolic (congestive) and diastolic (congestive) heart failure: Secondary | ICD-10-CM | POA: Diagnosis not present

## 2023-06-10 DIAGNOSIS — R2689 Other abnormalities of gait and mobility: Secondary | ICD-10-CM | POA: Diagnosis not present

## 2023-06-11 DIAGNOSIS — E0840 Diabetes mellitus due to underlying condition with diabetic neuropathy, unspecified: Secondary | ICD-10-CM | POA: Diagnosis not present

## 2023-06-11 DIAGNOSIS — R2689 Other abnormalities of gait and mobility: Secondary | ICD-10-CM | POA: Diagnosis not present

## 2023-06-11 DIAGNOSIS — I5040 Unspecified combined systolic (congestive) and diastolic (congestive) heart failure: Secondary | ICD-10-CM | POA: Diagnosis not present

## 2023-06-11 DIAGNOSIS — M6281 Muscle weakness (generalized): Secondary | ICD-10-CM | POA: Diagnosis not present

## 2023-06-11 DIAGNOSIS — M25561 Pain in right knee: Secondary | ICD-10-CM | POA: Diagnosis not present

## 2023-06-14 DIAGNOSIS — F329 Major depressive disorder, single episode, unspecified: Secondary | ICD-10-CM | POA: Diagnosis not present

## 2023-06-14 DIAGNOSIS — G4700 Insomnia, unspecified: Secondary | ICD-10-CM | POA: Diagnosis not present

## 2023-06-14 DIAGNOSIS — F419 Anxiety disorder, unspecified: Secondary | ICD-10-CM | POA: Diagnosis not present

## 2023-06-14 DIAGNOSIS — Z720 Tobacco use: Secondary | ICD-10-CM | POA: Diagnosis not present

## 2023-06-14 DIAGNOSIS — R2689 Other abnormalities of gait and mobility: Secondary | ICD-10-CM | POA: Diagnosis not present

## 2023-06-14 DIAGNOSIS — M25561 Pain in right knee: Secondary | ICD-10-CM | POA: Diagnosis not present

## 2023-06-14 DIAGNOSIS — M6281 Muscle weakness (generalized): Secondary | ICD-10-CM | POA: Diagnosis not present

## 2023-06-14 DIAGNOSIS — I5040 Unspecified combined systolic (congestive) and diastolic (congestive) heart failure: Secondary | ICD-10-CM | POA: Diagnosis not present

## 2023-06-14 DIAGNOSIS — E0840 Diabetes mellitus due to underlying condition with diabetic neuropathy, unspecified: Secondary | ICD-10-CM | POA: Diagnosis not present

## 2023-06-15 DIAGNOSIS — R2689 Other abnormalities of gait and mobility: Secondary | ICD-10-CM | POA: Diagnosis not present

## 2023-06-15 DIAGNOSIS — E0840 Diabetes mellitus due to underlying condition with diabetic neuropathy, unspecified: Secondary | ICD-10-CM | POA: Diagnosis not present

## 2023-06-15 DIAGNOSIS — M25561 Pain in right knee: Secondary | ICD-10-CM | POA: Diagnosis not present

## 2023-06-15 DIAGNOSIS — M6281 Muscle weakness (generalized): Secondary | ICD-10-CM | POA: Diagnosis not present

## 2023-06-15 DIAGNOSIS — I5040 Unspecified combined systolic (congestive) and diastolic (congestive) heart failure: Secondary | ICD-10-CM | POA: Diagnosis not present

## 2023-06-16 DIAGNOSIS — R2689 Other abnormalities of gait and mobility: Secondary | ICD-10-CM | POA: Diagnosis not present

## 2023-06-16 DIAGNOSIS — I5040 Unspecified combined systolic (congestive) and diastolic (congestive) heart failure: Secondary | ICD-10-CM | POA: Diagnosis not present

## 2023-06-16 DIAGNOSIS — M25561 Pain in right knee: Secondary | ICD-10-CM | POA: Diagnosis not present

## 2023-06-16 DIAGNOSIS — M6281 Muscle weakness (generalized): Secondary | ICD-10-CM | POA: Diagnosis not present

## 2023-06-16 DIAGNOSIS — E0840 Diabetes mellitus due to underlying condition with diabetic neuropathy, unspecified: Secondary | ICD-10-CM | POA: Diagnosis not present

## 2023-06-17 DIAGNOSIS — R2689 Other abnormalities of gait and mobility: Secondary | ICD-10-CM | POA: Diagnosis not present

## 2023-06-17 DIAGNOSIS — M6281 Muscle weakness (generalized): Secondary | ICD-10-CM | POA: Diagnosis not present

## 2023-06-17 DIAGNOSIS — M25561 Pain in right knee: Secondary | ICD-10-CM | POA: Diagnosis not present

## 2023-06-17 DIAGNOSIS — E0840 Diabetes mellitus due to underlying condition with diabetic neuropathy, unspecified: Secondary | ICD-10-CM | POA: Diagnosis not present

## 2023-06-17 DIAGNOSIS — I5040 Unspecified combined systolic (congestive) and diastolic (congestive) heart failure: Secondary | ICD-10-CM | POA: Diagnosis not present

## 2023-06-18 DIAGNOSIS — J449 Chronic obstructive pulmonary disease, unspecified: Secondary | ICD-10-CM | POA: Diagnosis not present

## 2023-06-18 DIAGNOSIS — E0840 Diabetes mellitus due to underlying condition with diabetic neuropathy, unspecified: Secondary | ICD-10-CM | POA: Diagnosis not present

## 2023-06-18 DIAGNOSIS — I10 Essential (primary) hypertension: Secondary | ICD-10-CM | POA: Diagnosis not present

## 2023-06-18 DIAGNOSIS — G629 Polyneuropathy, unspecified: Secondary | ICD-10-CM | POA: Diagnosis not present

## 2023-06-19 DIAGNOSIS — R2689 Other abnormalities of gait and mobility: Secondary | ICD-10-CM | POA: Diagnosis not present

## 2023-06-19 DIAGNOSIS — E0840 Diabetes mellitus due to underlying condition with diabetic neuropathy, unspecified: Secondary | ICD-10-CM | POA: Diagnosis not present

## 2023-06-19 DIAGNOSIS — M25561 Pain in right knee: Secondary | ICD-10-CM | POA: Diagnosis not present

## 2023-06-19 DIAGNOSIS — I5040 Unspecified combined systolic (congestive) and diastolic (congestive) heart failure: Secondary | ICD-10-CM | POA: Diagnosis not present

## 2023-06-19 DIAGNOSIS — M6281 Muscle weakness (generalized): Secondary | ICD-10-CM | POA: Diagnosis not present

## 2023-06-20 DIAGNOSIS — E0840 Diabetes mellitus due to underlying condition with diabetic neuropathy, unspecified: Secondary | ICD-10-CM | POA: Diagnosis not present

## 2023-06-20 DIAGNOSIS — I5040 Unspecified combined systolic (congestive) and diastolic (congestive) heart failure: Secondary | ICD-10-CM | POA: Diagnosis not present

## 2023-06-20 DIAGNOSIS — R2689 Other abnormalities of gait and mobility: Secondary | ICD-10-CM | POA: Diagnosis not present

## 2023-06-20 DIAGNOSIS — M25561 Pain in right knee: Secondary | ICD-10-CM | POA: Diagnosis not present

## 2023-06-20 DIAGNOSIS — M6281 Muscle weakness (generalized): Secondary | ICD-10-CM | POA: Diagnosis not present

## 2023-06-21 DIAGNOSIS — I890 Lymphedema, not elsewhere classified: Secondary | ICD-10-CM | POA: Diagnosis not present

## 2023-06-22 DIAGNOSIS — M6281 Muscle weakness (generalized): Secondary | ICD-10-CM | POA: Diagnosis not present

## 2023-06-22 DIAGNOSIS — I5040 Unspecified combined systolic (congestive) and diastolic (congestive) heart failure: Secondary | ICD-10-CM | POA: Diagnosis not present

## 2023-06-22 DIAGNOSIS — R2689 Other abnormalities of gait and mobility: Secondary | ICD-10-CM | POA: Diagnosis not present

## 2023-06-22 DIAGNOSIS — E0840 Diabetes mellitus due to underlying condition with diabetic neuropathy, unspecified: Secondary | ICD-10-CM | POA: Diagnosis not present

## 2023-06-22 DIAGNOSIS — M25561 Pain in right knee: Secondary | ICD-10-CM | POA: Diagnosis not present

## 2023-06-23 DIAGNOSIS — E0840 Diabetes mellitus due to underlying condition with diabetic neuropathy, unspecified: Secondary | ICD-10-CM | POA: Diagnosis not present

## 2023-06-23 DIAGNOSIS — I5040 Unspecified combined systolic (congestive) and diastolic (congestive) heart failure: Secondary | ICD-10-CM | POA: Diagnosis not present

## 2023-06-23 DIAGNOSIS — M25561 Pain in right knee: Secondary | ICD-10-CM | POA: Diagnosis not present

## 2023-06-23 DIAGNOSIS — M6281 Muscle weakness (generalized): Secondary | ICD-10-CM | POA: Diagnosis not present

## 2023-06-23 DIAGNOSIS — R2689 Other abnormalities of gait and mobility: Secondary | ICD-10-CM | POA: Diagnosis not present

## 2023-06-23 DIAGNOSIS — R609 Edema, unspecified: Secondary | ICD-10-CM | POA: Diagnosis not present

## 2023-06-24 DIAGNOSIS — M6281 Muscle weakness (generalized): Secondary | ICD-10-CM | POA: Diagnosis not present

## 2023-06-24 DIAGNOSIS — I5040 Unspecified combined systolic (congestive) and diastolic (congestive) heart failure: Secondary | ICD-10-CM | POA: Diagnosis not present

## 2023-06-24 DIAGNOSIS — M25561 Pain in right knee: Secondary | ICD-10-CM | POA: Diagnosis not present

## 2023-06-24 DIAGNOSIS — E0840 Diabetes mellitus due to underlying condition with diabetic neuropathy, unspecified: Secondary | ICD-10-CM | POA: Diagnosis not present

## 2023-06-24 DIAGNOSIS — R2689 Other abnormalities of gait and mobility: Secondary | ICD-10-CM | POA: Diagnosis not present

## 2023-06-26 DIAGNOSIS — E0840 Diabetes mellitus due to underlying condition with diabetic neuropathy, unspecified: Secondary | ICD-10-CM | POA: Diagnosis not present

## 2023-06-26 DIAGNOSIS — M25561 Pain in right knee: Secondary | ICD-10-CM | POA: Diagnosis not present

## 2023-06-26 DIAGNOSIS — M6281 Muscle weakness (generalized): Secondary | ICD-10-CM | POA: Diagnosis not present

## 2023-06-26 DIAGNOSIS — R2689 Other abnormalities of gait and mobility: Secondary | ICD-10-CM | POA: Diagnosis not present

## 2023-06-26 DIAGNOSIS — I5040 Unspecified combined systolic (congestive) and diastolic (congestive) heart failure: Secondary | ICD-10-CM | POA: Diagnosis not present

## 2023-06-27 DIAGNOSIS — M25561 Pain in right knee: Secondary | ICD-10-CM | POA: Diagnosis not present

## 2023-06-27 DIAGNOSIS — E0840 Diabetes mellitus due to underlying condition with diabetic neuropathy, unspecified: Secondary | ICD-10-CM | POA: Diagnosis not present

## 2023-06-27 DIAGNOSIS — I5040 Unspecified combined systolic (congestive) and diastolic (congestive) heart failure: Secondary | ICD-10-CM | POA: Diagnosis not present

## 2023-06-27 DIAGNOSIS — M6281 Muscle weakness (generalized): Secondary | ICD-10-CM | POA: Diagnosis not present

## 2023-06-27 DIAGNOSIS — R2689 Other abnormalities of gait and mobility: Secondary | ICD-10-CM | POA: Diagnosis not present

## 2023-06-29 DIAGNOSIS — R2689 Other abnormalities of gait and mobility: Secondary | ICD-10-CM | POA: Diagnosis not present

## 2023-06-29 DIAGNOSIS — M25561 Pain in right knee: Secondary | ICD-10-CM | POA: Diagnosis not present

## 2023-06-29 DIAGNOSIS — M6281 Muscle weakness (generalized): Secondary | ICD-10-CM | POA: Diagnosis not present

## 2023-06-29 DIAGNOSIS — I5040 Unspecified combined systolic (congestive) and diastolic (congestive) heart failure: Secondary | ICD-10-CM | POA: Diagnosis not present

## 2023-06-29 DIAGNOSIS — E0840 Diabetes mellitus due to underlying condition with diabetic neuropathy, unspecified: Secondary | ICD-10-CM | POA: Diagnosis not present

## 2023-06-30 DIAGNOSIS — J449 Chronic obstructive pulmonary disease, unspecified: Secondary | ICD-10-CM | POA: Diagnosis not present

## 2023-06-30 DIAGNOSIS — R2689 Other abnormalities of gait and mobility: Secondary | ICD-10-CM | POA: Diagnosis not present

## 2023-06-30 DIAGNOSIS — I10 Essential (primary) hypertension: Secondary | ICD-10-CM | POA: Diagnosis not present

## 2023-06-30 DIAGNOSIS — M25561 Pain in right knee: Secondary | ICD-10-CM | POA: Diagnosis not present

## 2023-06-30 DIAGNOSIS — G629 Polyneuropathy, unspecified: Secondary | ICD-10-CM | POA: Diagnosis not present

## 2023-06-30 DIAGNOSIS — I5040 Unspecified combined systolic (congestive) and diastolic (congestive) heart failure: Secondary | ICD-10-CM | POA: Diagnosis not present

## 2023-06-30 DIAGNOSIS — M6281 Muscle weakness (generalized): Secondary | ICD-10-CM | POA: Diagnosis not present

## 2023-06-30 DIAGNOSIS — E0840 Diabetes mellitus due to underlying condition with diabetic neuropathy, unspecified: Secondary | ICD-10-CM | POA: Diagnosis not present

## 2023-07-01 DIAGNOSIS — R609 Edema, unspecified: Secondary | ICD-10-CM | POA: Diagnosis not present

## 2023-07-01 DIAGNOSIS — R109 Unspecified abdominal pain: Secondary | ICD-10-CM | POA: Diagnosis not present

## 2023-07-01 DIAGNOSIS — R635 Abnormal weight gain: Secondary | ICD-10-CM | POA: Diagnosis not present

## 2023-07-02 DIAGNOSIS — J449 Chronic obstructive pulmonary disease, unspecified: Secondary | ICD-10-CM | POA: Diagnosis not present

## 2023-07-02 DIAGNOSIS — M6281 Muscle weakness (generalized): Secondary | ICD-10-CM | POA: Diagnosis not present

## 2023-07-02 DIAGNOSIS — I5040 Unspecified combined systolic (congestive) and diastolic (congestive) heart failure: Secondary | ICD-10-CM | POA: Diagnosis not present

## 2023-07-02 DIAGNOSIS — R2689 Other abnormalities of gait and mobility: Secondary | ICD-10-CM | POA: Diagnosis not present

## 2023-07-02 DIAGNOSIS — E0840 Diabetes mellitus due to underlying condition with diabetic neuropathy, unspecified: Secondary | ICD-10-CM | POA: Diagnosis not present

## 2023-07-02 DIAGNOSIS — M25561 Pain in right knee: Secondary | ICD-10-CM | POA: Diagnosis not present

## 2023-07-02 DIAGNOSIS — I10 Essential (primary) hypertension: Secondary | ICD-10-CM | POA: Diagnosis not present

## 2023-07-03 DIAGNOSIS — M6281 Muscle weakness (generalized): Secondary | ICD-10-CM | POA: Diagnosis not present

## 2023-07-03 DIAGNOSIS — R2689 Other abnormalities of gait and mobility: Secondary | ICD-10-CM | POA: Diagnosis not present

## 2023-07-03 DIAGNOSIS — I5040 Unspecified combined systolic (congestive) and diastolic (congestive) heart failure: Secondary | ICD-10-CM | POA: Diagnosis not present

## 2023-07-03 DIAGNOSIS — M25561 Pain in right knee: Secondary | ICD-10-CM | POA: Diagnosis not present

## 2023-07-03 DIAGNOSIS — E0840 Diabetes mellitus due to underlying condition with diabetic neuropathy, unspecified: Secondary | ICD-10-CM | POA: Diagnosis not present

## 2023-07-05 DIAGNOSIS — M6281 Muscle weakness (generalized): Secondary | ICD-10-CM | POA: Diagnosis not present

## 2023-07-05 DIAGNOSIS — R2689 Other abnormalities of gait and mobility: Secondary | ICD-10-CM | POA: Diagnosis not present

## 2023-07-05 DIAGNOSIS — I5040 Unspecified combined systolic (congestive) and diastolic (congestive) heart failure: Secondary | ICD-10-CM | POA: Diagnosis not present

## 2023-07-05 DIAGNOSIS — E0840 Diabetes mellitus due to underlying condition with diabetic neuropathy, unspecified: Secondary | ICD-10-CM | POA: Diagnosis not present

## 2023-07-05 DIAGNOSIS — M25561 Pain in right knee: Secondary | ICD-10-CM | POA: Diagnosis not present

## 2023-07-06 DIAGNOSIS — E119 Type 2 diabetes mellitus without complications: Secondary | ICD-10-CM | POA: Diagnosis not present

## 2023-07-06 DIAGNOSIS — I5040 Unspecified combined systolic (congestive) and diastolic (congestive) heart failure: Secondary | ICD-10-CM | POA: Diagnosis not present

## 2023-07-06 DIAGNOSIS — R2689 Other abnormalities of gait and mobility: Secondary | ICD-10-CM | POA: Diagnosis not present

## 2023-07-06 DIAGNOSIS — M6281 Muscle weakness (generalized): Secondary | ICD-10-CM | POA: Diagnosis not present

## 2023-07-06 DIAGNOSIS — E0840 Diabetes mellitus due to underlying condition with diabetic neuropathy, unspecified: Secondary | ICD-10-CM | POA: Diagnosis not present

## 2023-07-06 DIAGNOSIS — M25561 Pain in right knee: Secondary | ICD-10-CM | POA: Diagnosis not present

## 2023-07-07 DIAGNOSIS — E0840 Diabetes mellitus due to underlying condition with diabetic neuropathy, unspecified: Secondary | ICD-10-CM | POA: Diagnosis not present

## 2023-07-07 DIAGNOSIS — M25561 Pain in right knee: Secondary | ICD-10-CM | POA: Diagnosis not present

## 2023-07-07 DIAGNOSIS — M6281 Muscle weakness (generalized): Secondary | ICD-10-CM | POA: Diagnosis not present

## 2023-07-07 DIAGNOSIS — I5040 Unspecified combined systolic (congestive) and diastolic (congestive) heart failure: Secondary | ICD-10-CM | POA: Diagnosis not present

## 2023-07-07 DIAGNOSIS — R2689 Other abnormalities of gait and mobility: Secondary | ICD-10-CM | POA: Diagnosis not present

## 2023-07-08 DIAGNOSIS — M25561 Pain in right knee: Secondary | ICD-10-CM | POA: Diagnosis not present

## 2023-07-08 DIAGNOSIS — E0840 Diabetes mellitus due to underlying condition with diabetic neuropathy, unspecified: Secondary | ICD-10-CM | POA: Diagnosis not present

## 2023-07-08 DIAGNOSIS — I5040 Unspecified combined systolic (congestive) and diastolic (congestive) heart failure: Secondary | ICD-10-CM | POA: Diagnosis not present

## 2023-07-08 DIAGNOSIS — R2689 Other abnormalities of gait and mobility: Secondary | ICD-10-CM | POA: Diagnosis not present

## 2023-07-08 DIAGNOSIS — M6281 Muscle weakness (generalized): Secondary | ICD-10-CM | POA: Diagnosis not present

## 2023-07-09 DIAGNOSIS — R2689 Other abnormalities of gait and mobility: Secondary | ICD-10-CM | POA: Diagnosis not present

## 2023-07-09 DIAGNOSIS — E0840 Diabetes mellitus due to underlying condition with diabetic neuropathy, unspecified: Secondary | ICD-10-CM | POA: Diagnosis not present

## 2023-07-09 DIAGNOSIS — M25561 Pain in right knee: Secondary | ICD-10-CM | POA: Diagnosis not present

## 2023-07-09 DIAGNOSIS — M6281 Muscle weakness (generalized): Secondary | ICD-10-CM | POA: Diagnosis not present

## 2023-07-09 DIAGNOSIS — I5040 Unspecified combined systolic (congestive) and diastolic (congestive) heart failure: Secondary | ICD-10-CM | POA: Diagnosis not present

## 2023-07-10 DIAGNOSIS — M6281 Muscle weakness (generalized): Secondary | ICD-10-CM | POA: Diagnosis not present

## 2023-07-10 DIAGNOSIS — I5040 Unspecified combined systolic (congestive) and diastolic (congestive) heart failure: Secondary | ICD-10-CM | POA: Diagnosis not present

## 2023-07-10 DIAGNOSIS — M25561 Pain in right knee: Secondary | ICD-10-CM | POA: Diagnosis not present

## 2023-07-10 DIAGNOSIS — R2689 Other abnormalities of gait and mobility: Secondary | ICD-10-CM | POA: Diagnosis not present

## 2023-07-10 DIAGNOSIS — E0840 Diabetes mellitus due to underlying condition with diabetic neuropathy, unspecified: Secondary | ICD-10-CM | POA: Diagnosis not present

## 2023-07-13 DIAGNOSIS — M25561 Pain in right knee: Secondary | ICD-10-CM | POA: Diagnosis not present

## 2023-07-13 DIAGNOSIS — M6281 Muscle weakness (generalized): Secondary | ICD-10-CM | POA: Diagnosis not present

## 2023-07-13 DIAGNOSIS — I5040 Unspecified combined systolic (congestive) and diastolic (congestive) heart failure: Secondary | ICD-10-CM | POA: Diagnosis not present

## 2023-07-13 DIAGNOSIS — E0840 Diabetes mellitus due to underlying condition with diabetic neuropathy, unspecified: Secondary | ICD-10-CM | POA: Diagnosis not present

## 2023-07-13 DIAGNOSIS — R2689 Other abnormalities of gait and mobility: Secondary | ICD-10-CM | POA: Diagnosis not present

## 2023-07-14 DIAGNOSIS — M25561 Pain in right knee: Secondary | ICD-10-CM | POA: Diagnosis not present

## 2023-07-14 DIAGNOSIS — R2689 Other abnormalities of gait and mobility: Secondary | ICD-10-CM | POA: Diagnosis not present

## 2023-07-14 DIAGNOSIS — I5040 Unspecified combined systolic (congestive) and diastolic (congestive) heart failure: Secondary | ICD-10-CM | POA: Diagnosis not present

## 2023-07-14 DIAGNOSIS — J449 Chronic obstructive pulmonary disease, unspecified: Secondary | ICD-10-CM | POA: Diagnosis not present

## 2023-07-14 DIAGNOSIS — I110 Hypertensive heart disease with heart failure: Secondary | ICD-10-CM | POA: Diagnosis not present

## 2023-07-14 DIAGNOSIS — E0840 Diabetes mellitus due to underlying condition with diabetic neuropathy, unspecified: Secondary | ICD-10-CM | POA: Diagnosis not present

## 2023-07-14 DIAGNOSIS — M6281 Muscle weakness (generalized): Secondary | ICD-10-CM | POA: Diagnosis not present

## 2023-07-15 DIAGNOSIS — E119 Type 2 diabetes mellitus without complications: Secondary | ICD-10-CM | POA: Diagnosis not present

## 2023-07-15 DIAGNOSIS — M25561 Pain in right knee: Secondary | ICD-10-CM | POA: Diagnosis not present

## 2023-07-15 DIAGNOSIS — I5040 Unspecified combined systolic (congestive) and diastolic (congestive) heart failure: Secondary | ICD-10-CM | POA: Diagnosis not present

## 2023-07-15 DIAGNOSIS — I10 Essential (primary) hypertension: Secondary | ICD-10-CM | POA: Diagnosis not present

## 2023-07-15 DIAGNOSIS — E0840 Diabetes mellitus due to underlying condition with diabetic neuropathy, unspecified: Secondary | ICD-10-CM | POA: Diagnosis not present

## 2023-07-15 DIAGNOSIS — R2689 Other abnormalities of gait and mobility: Secondary | ICD-10-CM | POA: Diagnosis not present

## 2023-07-15 DIAGNOSIS — M6281 Muscle weakness (generalized): Secondary | ICD-10-CM | POA: Diagnosis not present

## 2023-07-17 DIAGNOSIS — K5900 Constipation, unspecified: Secondary | ICD-10-CM | POA: Diagnosis not present

## 2023-07-17 DIAGNOSIS — D6869 Other thrombophilia: Secondary | ICD-10-CM | POA: Diagnosis not present

## 2023-07-17 DIAGNOSIS — E1169 Type 2 diabetes mellitus with other specified complication: Secondary | ICD-10-CM | POA: Diagnosis not present

## 2023-07-18 DIAGNOSIS — I5040 Unspecified combined systolic (congestive) and diastolic (congestive) heart failure: Secondary | ICD-10-CM | POA: Diagnosis not present

## 2023-07-18 DIAGNOSIS — R2689 Other abnormalities of gait and mobility: Secondary | ICD-10-CM | POA: Diagnosis not present

## 2023-07-18 DIAGNOSIS — E0840 Diabetes mellitus due to underlying condition with diabetic neuropathy, unspecified: Secondary | ICD-10-CM | POA: Diagnosis not present

## 2023-07-18 DIAGNOSIS — M25561 Pain in right knee: Secondary | ICD-10-CM | POA: Diagnosis not present

## 2023-07-18 DIAGNOSIS — M6281 Muscle weakness (generalized): Secondary | ICD-10-CM | POA: Diagnosis not present

## 2023-07-21 DIAGNOSIS — F419 Anxiety disorder, unspecified: Secondary | ICD-10-CM | POA: Diagnosis not present

## 2023-07-21 DIAGNOSIS — R2689 Other abnormalities of gait and mobility: Secondary | ICD-10-CM | POA: Diagnosis not present

## 2023-07-21 DIAGNOSIS — F329 Major depressive disorder, single episode, unspecified: Secondary | ICD-10-CM | POA: Diagnosis not present

## 2023-07-21 DIAGNOSIS — M6281 Muscle weakness (generalized): Secondary | ICD-10-CM | POA: Diagnosis not present

## 2023-07-21 DIAGNOSIS — G4700 Insomnia, unspecified: Secondary | ICD-10-CM | POA: Diagnosis not present

## 2023-07-21 DIAGNOSIS — E0840 Diabetes mellitus due to underlying condition with diabetic neuropathy, unspecified: Secondary | ICD-10-CM | POA: Diagnosis not present

## 2023-07-21 DIAGNOSIS — Z720 Tobacco use: Secondary | ICD-10-CM | POA: Diagnosis not present

## 2023-07-21 DIAGNOSIS — M25561 Pain in right knee: Secondary | ICD-10-CM | POA: Diagnosis not present

## 2023-07-21 DIAGNOSIS — I5040 Unspecified combined systolic (congestive) and diastolic (congestive) heart failure: Secondary | ICD-10-CM | POA: Diagnosis not present

## 2023-07-22 DIAGNOSIS — R2689 Other abnormalities of gait and mobility: Secondary | ICD-10-CM | POA: Diagnosis not present

## 2023-07-22 DIAGNOSIS — I5040 Unspecified combined systolic (congestive) and diastolic (congestive) heart failure: Secondary | ICD-10-CM | POA: Diagnosis not present

## 2023-07-22 DIAGNOSIS — M6281 Muscle weakness (generalized): Secondary | ICD-10-CM | POA: Diagnosis not present

## 2023-07-22 DIAGNOSIS — E0840 Diabetes mellitus due to underlying condition with diabetic neuropathy, unspecified: Secondary | ICD-10-CM | POA: Diagnosis not present

## 2023-07-22 DIAGNOSIS — M25561 Pain in right knee: Secondary | ICD-10-CM | POA: Diagnosis not present

## 2023-07-23 DIAGNOSIS — E119 Type 2 diabetes mellitus without complications: Secondary | ICD-10-CM | POA: Diagnosis not present

## 2023-07-23 DIAGNOSIS — I10 Essential (primary) hypertension: Secondary | ICD-10-CM | POA: Diagnosis not present

## 2023-07-23 DIAGNOSIS — E785 Hyperlipidemia, unspecified: Secondary | ICD-10-CM | POA: Diagnosis not present

## 2023-07-23 DIAGNOSIS — I480 Paroxysmal atrial fibrillation: Secondary | ICD-10-CM | POA: Diagnosis not present

## 2023-07-24 DIAGNOSIS — R2689 Other abnormalities of gait and mobility: Secondary | ICD-10-CM | POA: Diagnosis not present

## 2023-07-24 DIAGNOSIS — E0840 Diabetes mellitus due to underlying condition with diabetic neuropathy, unspecified: Secondary | ICD-10-CM | POA: Diagnosis not present

## 2023-07-24 DIAGNOSIS — M25561 Pain in right knee: Secondary | ICD-10-CM | POA: Diagnosis not present

## 2023-07-24 DIAGNOSIS — I5040 Unspecified combined systolic (congestive) and diastolic (congestive) heart failure: Secondary | ICD-10-CM | POA: Diagnosis not present

## 2023-07-24 DIAGNOSIS — M6281 Muscle weakness (generalized): Secondary | ICD-10-CM | POA: Diagnosis not present

## 2023-07-25 DIAGNOSIS — M6281 Muscle weakness (generalized): Secondary | ICD-10-CM | POA: Diagnosis not present

## 2023-07-25 DIAGNOSIS — I5040 Unspecified combined systolic (congestive) and diastolic (congestive) heart failure: Secondary | ICD-10-CM | POA: Diagnosis not present

## 2023-07-25 DIAGNOSIS — R2689 Other abnormalities of gait and mobility: Secondary | ICD-10-CM | POA: Diagnosis not present

## 2023-07-25 DIAGNOSIS — E0840 Diabetes mellitus due to underlying condition with diabetic neuropathy, unspecified: Secondary | ICD-10-CM | POA: Diagnosis not present

## 2023-07-25 DIAGNOSIS — M25561 Pain in right knee: Secondary | ICD-10-CM | POA: Diagnosis not present

## 2023-07-26 DIAGNOSIS — M6281 Muscle weakness (generalized): Secondary | ICD-10-CM | POA: Diagnosis not present

## 2023-07-26 DIAGNOSIS — E0840 Diabetes mellitus due to underlying condition with diabetic neuropathy, unspecified: Secondary | ICD-10-CM | POA: Diagnosis not present

## 2023-07-26 DIAGNOSIS — M25561 Pain in right knee: Secondary | ICD-10-CM | POA: Diagnosis not present

## 2023-07-26 DIAGNOSIS — I5040 Unspecified combined systolic (congestive) and diastolic (congestive) heart failure: Secondary | ICD-10-CM | POA: Diagnosis not present

## 2023-07-26 DIAGNOSIS — R2689 Other abnormalities of gait and mobility: Secondary | ICD-10-CM | POA: Diagnosis not present

## 2023-07-27 DIAGNOSIS — R2689 Other abnormalities of gait and mobility: Secondary | ICD-10-CM | POA: Diagnosis not present

## 2023-07-27 DIAGNOSIS — I110 Hypertensive heart disease with heart failure: Secondary | ICD-10-CM | POA: Diagnosis not present

## 2023-07-27 DIAGNOSIS — E0840 Diabetes mellitus due to underlying condition with diabetic neuropathy, unspecified: Secondary | ICD-10-CM | POA: Diagnosis not present

## 2023-07-27 DIAGNOSIS — M6281 Muscle weakness (generalized): Secondary | ICD-10-CM | POA: Diagnosis not present

## 2023-07-27 DIAGNOSIS — M25561 Pain in right knee: Secondary | ICD-10-CM | POA: Diagnosis not present

## 2023-07-27 DIAGNOSIS — J449 Chronic obstructive pulmonary disease, unspecified: Secondary | ICD-10-CM | POA: Diagnosis not present

## 2023-07-27 DIAGNOSIS — I5040 Unspecified combined systolic (congestive) and diastolic (congestive) heart failure: Secondary | ICD-10-CM | POA: Diagnosis not present

## 2023-07-28 DIAGNOSIS — M6281 Muscle weakness (generalized): Secondary | ICD-10-CM | POA: Diagnosis not present

## 2023-07-28 DIAGNOSIS — M25561 Pain in right knee: Secondary | ICD-10-CM | POA: Diagnosis not present

## 2023-07-28 DIAGNOSIS — I5040 Unspecified combined systolic (congestive) and diastolic (congestive) heart failure: Secondary | ICD-10-CM | POA: Diagnosis not present

## 2023-07-28 DIAGNOSIS — R2689 Other abnormalities of gait and mobility: Secondary | ICD-10-CM | POA: Diagnosis not present

## 2023-07-28 DIAGNOSIS — E0840 Diabetes mellitus due to underlying condition with diabetic neuropathy, unspecified: Secondary | ICD-10-CM | POA: Diagnosis not present

## 2023-07-29 DIAGNOSIS — R609 Edema, unspecified: Secondary | ICD-10-CM | POA: Diagnosis not present

## 2023-07-29 DIAGNOSIS — E1165 Type 2 diabetes mellitus with hyperglycemia: Secondary | ICD-10-CM | POA: Diagnosis not present

## 2023-07-29 DIAGNOSIS — J449 Chronic obstructive pulmonary disease, unspecified: Secondary | ICD-10-CM | POA: Diagnosis not present

## 2023-07-29 DIAGNOSIS — I10 Essential (primary) hypertension: Secondary | ICD-10-CM | POA: Diagnosis not present

## 2023-08-06 DIAGNOSIS — E119 Type 2 diabetes mellitus without complications: Secondary | ICD-10-CM | POA: Diagnosis not present

## 2023-08-06 DIAGNOSIS — R062 Wheezing: Secondary | ICD-10-CM | POA: Diagnosis not present

## 2023-08-06 DIAGNOSIS — R0602 Shortness of breath: Secondary | ICD-10-CM | POA: Diagnosis not present

## 2023-08-07 DIAGNOSIS — I10 Essential (primary) hypertension: Secondary | ICD-10-CM | POA: Diagnosis not present

## 2023-08-07 DIAGNOSIS — E785 Hyperlipidemia, unspecified: Secondary | ICD-10-CM | POA: Diagnosis not present

## 2023-08-08 DIAGNOSIS — I110 Hypertensive heart disease with heart failure: Secondary | ICD-10-CM | POA: Diagnosis not present

## 2023-08-08 DIAGNOSIS — J449 Chronic obstructive pulmonary disease, unspecified: Secondary | ICD-10-CM | POA: Diagnosis not present

## 2023-08-08 DIAGNOSIS — E0840 Diabetes mellitus due to underlying condition with diabetic neuropathy, unspecified: Secondary | ICD-10-CM | POA: Diagnosis not present

## 2023-08-08 DIAGNOSIS — I5040 Unspecified combined systolic (congestive) and diastolic (congestive) heart failure: Secondary | ICD-10-CM | POA: Diagnosis not present

## 2023-08-10 DIAGNOSIS — E0840 Diabetes mellitus due to underlying condition with diabetic neuropathy, unspecified: Secondary | ICD-10-CM | POA: Diagnosis not present

## 2023-08-10 DIAGNOSIS — I5040 Unspecified combined systolic (congestive) and diastolic (congestive) heart failure: Secondary | ICD-10-CM | POA: Diagnosis not present

## 2023-08-10 DIAGNOSIS — I110 Hypertensive heart disease with heart failure: Secondary | ICD-10-CM | POA: Diagnosis not present

## 2023-08-10 DIAGNOSIS — J449 Chronic obstructive pulmonary disease, unspecified: Secondary | ICD-10-CM | POA: Diagnosis not present

## 2023-08-11 DIAGNOSIS — R5383 Other fatigue: Secondary | ICD-10-CM | POA: Diagnosis not present

## 2023-08-16 DIAGNOSIS — J449 Chronic obstructive pulmonary disease, unspecified: Secondary | ICD-10-CM | POA: Diagnosis not present

## 2023-08-16 DIAGNOSIS — E0840 Diabetes mellitus due to underlying condition with diabetic neuropathy, unspecified: Secondary | ICD-10-CM | POA: Diagnosis not present

## 2023-08-16 DIAGNOSIS — M25511 Pain in right shoulder: Secondary | ICD-10-CM | POA: Diagnosis not present

## 2023-08-16 DIAGNOSIS — I5040 Unspecified combined systolic (congestive) and diastolic (congestive) heart failure: Secondary | ICD-10-CM | POA: Diagnosis not present

## 2023-08-16 DIAGNOSIS — I110 Hypertensive heart disease with heart failure: Secondary | ICD-10-CM | POA: Diagnosis not present

## 2023-08-19 DIAGNOSIS — I5040 Unspecified combined systolic (congestive) and diastolic (congestive) heart failure: Secondary | ICD-10-CM | POA: Diagnosis not present

## 2023-08-19 DIAGNOSIS — J449 Chronic obstructive pulmonary disease, unspecified: Secondary | ICD-10-CM | POA: Diagnosis not present

## 2023-08-19 DIAGNOSIS — E0840 Diabetes mellitus due to underlying condition with diabetic neuropathy, unspecified: Secondary | ICD-10-CM | POA: Diagnosis not present

## 2023-08-19 DIAGNOSIS — I10 Essential (primary) hypertension: Secondary | ICD-10-CM | POA: Diagnosis not present

## 2023-08-19 DIAGNOSIS — I110 Hypertensive heart disease with heart failure: Secondary | ICD-10-CM | POA: Diagnosis not present

## 2023-08-26 DIAGNOSIS — G4700 Insomnia, unspecified: Secondary | ICD-10-CM | POA: Diagnosis not present

## 2023-08-26 DIAGNOSIS — F329 Major depressive disorder, single episode, unspecified: Secondary | ICD-10-CM | POA: Diagnosis not present

## 2023-08-26 DIAGNOSIS — Z720 Tobacco use: Secondary | ICD-10-CM | POA: Diagnosis not present

## 2023-08-26 DIAGNOSIS — F419 Anxiety disorder, unspecified: Secondary | ICD-10-CM | POA: Diagnosis not present

## 2023-08-27 DIAGNOSIS — I5040 Unspecified combined systolic (congestive) and diastolic (congestive) heart failure: Secondary | ICD-10-CM | POA: Diagnosis not present

## 2023-08-27 DIAGNOSIS — J449 Chronic obstructive pulmonary disease, unspecified: Secondary | ICD-10-CM | POA: Diagnosis not present

## 2023-08-27 DIAGNOSIS — I110 Hypertensive heart disease with heart failure: Secondary | ICD-10-CM | POA: Diagnosis not present

## 2023-08-27 DIAGNOSIS — E0840 Diabetes mellitus due to underlying condition with diabetic neuropathy, unspecified: Secondary | ICD-10-CM | POA: Diagnosis not present

## 2023-09-01 DIAGNOSIS — M6281 Muscle weakness (generalized): Secondary | ICD-10-CM | POA: Diagnosis not present

## 2023-09-01 DIAGNOSIS — J449 Chronic obstructive pulmonary disease, unspecified: Secondary | ICD-10-CM | POA: Diagnosis not present

## 2023-09-01 DIAGNOSIS — I5040 Unspecified combined systolic (congestive) and diastolic (congestive) heart failure: Secondary | ICD-10-CM | POA: Diagnosis not present

## 2023-09-01 DIAGNOSIS — E0840 Diabetes mellitus due to underlying condition with diabetic neuropathy, unspecified: Secondary | ICD-10-CM | POA: Diagnosis not present

## 2023-09-01 DIAGNOSIS — Z7409 Other reduced mobility: Secondary | ICD-10-CM | POA: Diagnosis not present

## 2023-09-02 DIAGNOSIS — M6281 Muscle weakness (generalized): Secondary | ICD-10-CM | POA: Diagnosis not present

## 2023-09-02 DIAGNOSIS — E1165 Type 2 diabetes mellitus with hyperglycemia: Secondary | ICD-10-CM | POA: Diagnosis not present

## 2023-09-02 DIAGNOSIS — Z7409 Other reduced mobility: Secondary | ICD-10-CM | POA: Diagnosis not present

## 2023-09-02 DIAGNOSIS — I10 Essential (primary) hypertension: Secondary | ICD-10-CM | POA: Diagnosis not present

## 2023-09-02 DIAGNOSIS — E785 Hyperlipidemia, unspecified: Secondary | ICD-10-CM | POA: Diagnosis not present

## 2023-09-02 DIAGNOSIS — I5040 Unspecified combined systolic (congestive) and diastolic (congestive) heart failure: Secondary | ICD-10-CM | POA: Diagnosis not present

## 2023-09-02 DIAGNOSIS — J449 Chronic obstructive pulmonary disease, unspecified: Secondary | ICD-10-CM | POA: Diagnosis not present

## 2023-09-02 DIAGNOSIS — I480 Paroxysmal atrial fibrillation: Secondary | ICD-10-CM | POA: Diagnosis not present

## 2023-09-02 DIAGNOSIS — R609 Edema, unspecified: Secondary | ICD-10-CM | POA: Diagnosis not present

## 2023-09-02 DIAGNOSIS — E0840 Diabetes mellitus due to underlying condition with diabetic neuropathy, unspecified: Secondary | ICD-10-CM | POA: Diagnosis not present

## 2023-09-02 DIAGNOSIS — E119 Type 2 diabetes mellitus without complications: Secondary | ICD-10-CM | POA: Diagnosis not present

## 2023-09-05 DIAGNOSIS — Z7409 Other reduced mobility: Secondary | ICD-10-CM | POA: Diagnosis not present

## 2023-09-05 DIAGNOSIS — M6281 Muscle weakness (generalized): Secondary | ICD-10-CM | POA: Diagnosis not present

## 2023-09-05 DIAGNOSIS — E0840 Diabetes mellitus due to underlying condition with diabetic neuropathy, unspecified: Secondary | ICD-10-CM | POA: Diagnosis not present

## 2023-09-05 DIAGNOSIS — I5040 Unspecified combined systolic (congestive) and diastolic (congestive) heart failure: Secondary | ICD-10-CM | POA: Diagnosis not present

## 2023-09-05 DIAGNOSIS — J449 Chronic obstructive pulmonary disease, unspecified: Secondary | ICD-10-CM | POA: Diagnosis not present

## 2023-09-06 DIAGNOSIS — E119 Type 2 diabetes mellitus without complications: Secondary | ICD-10-CM | POA: Diagnosis not present

## 2023-09-07 DIAGNOSIS — E0840 Diabetes mellitus due to underlying condition with diabetic neuropathy, unspecified: Secondary | ICD-10-CM | POA: Diagnosis not present

## 2023-09-07 DIAGNOSIS — I5040 Unspecified combined systolic (congestive) and diastolic (congestive) heart failure: Secondary | ICD-10-CM | POA: Diagnosis not present

## 2023-09-07 DIAGNOSIS — J449 Chronic obstructive pulmonary disease, unspecified: Secondary | ICD-10-CM | POA: Diagnosis not present

## 2023-09-07 DIAGNOSIS — M6281 Muscle weakness (generalized): Secondary | ICD-10-CM | POA: Diagnosis not present

## 2023-09-08 ENCOUNTER — Encounter (HOSPITAL_BASED_OUTPATIENT_CLINIC_OR_DEPARTMENT_OTHER): Payer: Self-pay | Admitting: Cardiology

## 2023-09-08 ENCOUNTER — Ambulatory Visit (HOSPITAL_BASED_OUTPATIENT_CLINIC_OR_DEPARTMENT_OTHER): Admitting: Cardiology

## 2023-09-08 VITALS — BP 92/60 | HR 97 | Resp 16 | Ht 61.0 in | Wt 292.0 lb

## 2023-09-08 DIAGNOSIS — M6281 Muscle weakness (generalized): Secondary | ICD-10-CM | POA: Diagnosis not present

## 2023-09-08 DIAGNOSIS — E669 Obesity, unspecified: Secondary | ICD-10-CM

## 2023-09-08 DIAGNOSIS — I890 Lymphedema, not elsewhere classified: Secondary | ICD-10-CM

## 2023-09-08 DIAGNOSIS — G4733 Obstructive sleep apnea (adult) (pediatric): Secondary | ICD-10-CM

## 2023-09-08 DIAGNOSIS — E0840 Diabetes mellitus due to underlying condition with diabetic neuropathy, unspecified: Secondary | ICD-10-CM | POA: Diagnosis not present

## 2023-09-08 DIAGNOSIS — Z7409 Other reduced mobility: Secondary | ICD-10-CM | POA: Diagnosis not present

## 2023-09-08 DIAGNOSIS — J9611 Chronic respiratory failure with hypoxia: Secondary | ICD-10-CM

## 2023-09-08 DIAGNOSIS — I700 Atherosclerosis of aorta: Secondary | ICD-10-CM

## 2023-09-08 DIAGNOSIS — I5040 Unspecified combined systolic (congestive) and diastolic (congestive) heart failure: Secondary | ICD-10-CM | POA: Diagnosis not present

## 2023-09-08 DIAGNOSIS — I5032 Chronic diastolic (congestive) heart failure: Secondary | ICD-10-CM

## 2023-09-08 DIAGNOSIS — J449 Chronic obstructive pulmonary disease, unspecified: Secondary | ICD-10-CM | POA: Diagnosis not present

## 2023-09-08 NOTE — Progress Notes (Signed)
 Cardiology Office Note:  .   Date:  09/08/2023  ID:  Faith Flores, DOB 1955-04-10, MRN [id number] PCP: Albina GORMAN Dine, MD  [facility] Providers Cardiologist:  Faith Bruckner, MD {  History of Present Illness: .   Faith Flores is a 68 y.o. female with PMH paroxysmal SVT, hypertension, LBBB, type II diabetes, chronic diastolic heart failure, morbid obesity, chronic hypoxic respiratory failure.  Today: Referral from 04/29/23 from Ezella Creek, NP reviewed. No notes included in referral to review. Patient was remotely seen by Dr. Sheena, last in 2021. She had an echo which showed EF 55-60^, grade 2 diastolic dysfunction with mild LVH, normal right sided pressure/function, mild MR but no significant valve disease, RAP 3.  Zio showed 10 brief SVT episodes, longest 26 seconds.  Patient feels she is doing well. Her shortness of breath has gotten better over the last few months. She is able to tolerate more activity. She still has chronic LE edema, which has been stable. She saw vein and vascular 01/29/23, felt to be lymphedema. Went to lymphedema clinic, was being wrapped for a while but stopped when this was causing wounds. Was told she would get a custom wrapping sleeve but has not had this arrive yet.   Has some chest heaviness/shortness of breath if she pushes herself too hard, but this gets better with rest. Has been working with PT/OT at her facility.  On chronic home O2 constantly, 2L/min. OSA, does not have CPAP at her facility. Started Ozempic, has lost 17.5 lbs without major side effects.   ROS: Denies PND, orthopnea. No syncope or palpitations. ROS otherwise negative except as noted.   Studies Reviewed: SABRA    EKG:  EKG Interpretation Date/Time:  Wednesday September 08 2023 11:13:33 EDT Ventricular Rate:  78 PR Interval:  214 QRS Duration:  152 QT Interval:  420 QTC Calculation: 478 R Axis:   -31  Text Interpretation: Sinus rhythm with 1st degree A-V block Left  axis deviation Left bundle branch block Confirmed by Flores Faith [phone]) on 09/08/2023 11:29:24 AM    Physical Exam:   VS:  BP 92/60 (BP Location: Left Arm, Patient Position: Sitting, Cuff Size: Large)   Pulse 97   Resp 16   Ht 5' 1 (1.549 m)   Wt 292 lb (132.5 kg)   SpO2 93%   BMI 55.17 kg/m    Wt Readings from Last 3 Encounters:  09/08/23 292 lb (132.5 kg)  01/29/23 (!) 302 lb 8 oz (137.2 kg)  10/01/22 299 lb (135.6 kg)    GEN: Well nourished, well developed in no acute distress. On O2 by nasal cannula HEENT: Normal, moist mucous membranes NECK: JVD not well visualized 2/2 body habitus CARDIAC: regular rhythm, normal S1 and S2, no rubs or gallops. No murmur. VASCULAR: Radial and DP pulses 2+ bilaterally. No carotid bruits RESPIRATORY:  Clear to auscultation without rales, wheezing or rhonchi  ABDOMEN: Soft, non-tender, non-distended MUSCULOSKELETAL:  Ambulates independently with rollator SKIN: Warm and dry, chronic severe bilateral LE edema without significant pitting NEUROLOGIC:  Alert and oriented x 3. No focal neuro deficits noted. PSYCHIATRIC:  Normal affect    ASSESSMENT AND PLAN: .    Chronic diastolic heart failure -does not appear significantly volume overloaded on exam -no blood pressure room to make adjustments today -continue spironolactone , torsemide , jardiance   Chronic hypoxic respiratory failure Morbid obesity, with likely obesity hypoventilation syndrome OSA, not currently on CPAP -continue home O2  Chronic lymphedema -has been seen by  vein and vascular, also following with lymphedema clinic  Type II diabetes Aortic atherosclerosis -on SGLT2i and GLP1RA -on aspirin  and statin for prevention  Dispo: 1 year or sooner as needed   Signed, Faith Bruckner, MD   Faith Bruckner, MD, PhD, [facility]  [facility]  [place]  [facility] at [facility] at [facility] [address], [address] [place], [place] [address] [phone]

## 2023-09-08 NOTE — Patient Instructions (Signed)
 Medication Instructions:   Your physician recommends that you continue on your current medications as directed. Please refer to the Current Medication list given to you today.  *If you need a refill on your cardiac medications before your next appointment, please call your pharmacy*    Follow-Up: At [facility], you and your health needs are our priority.  As part of our continuing mission to provide you with exceptional heart care, our providers are all part of one team.  This team includes your primary Cardiologist (physician) and Advanced Practice Providers or APPs (Physician Assistants and Nurse Practitioners) who all work together to provide you with the care you need, when you need it.  Your next appointment:   1 year(s)  Provider:   Shelda Bruckner, MD

## 2023-09-09 DIAGNOSIS — I5040 Unspecified combined systolic (congestive) and diastolic (congestive) heart failure: Secondary | ICD-10-CM | POA: Diagnosis not present

## 2023-09-09 DIAGNOSIS — E0840 Diabetes mellitus due to underlying condition with diabetic neuropathy, unspecified: Secondary | ICD-10-CM | POA: Diagnosis not present

## 2023-09-09 DIAGNOSIS — J449 Chronic obstructive pulmonary disease, unspecified: Secondary | ICD-10-CM | POA: Diagnosis not present

## 2023-09-09 DIAGNOSIS — M6281 Muscle weakness (generalized): Secondary | ICD-10-CM | POA: Diagnosis not present

## 2023-09-10 DIAGNOSIS — M6281 Muscle weakness (generalized): Secondary | ICD-10-CM | POA: Diagnosis not present

## 2023-09-10 DIAGNOSIS — I110 Hypertensive heart disease with heart failure: Secondary | ICD-10-CM | POA: Diagnosis not present

## 2023-09-10 DIAGNOSIS — I5040 Unspecified combined systolic (congestive) and diastolic (congestive) heart failure: Secondary | ICD-10-CM | POA: Diagnosis not present

## 2023-09-10 DIAGNOSIS — J449 Chronic obstructive pulmonary disease, unspecified: Secondary | ICD-10-CM | POA: Diagnosis not present

## 2023-09-10 DIAGNOSIS — E0840 Diabetes mellitus due to underlying condition with diabetic neuropathy, unspecified: Secondary | ICD-10-CM | POA: Diagnosis not present

## 2023-09-11 DIAGNOSIS — M6281 Muscle weakness (generalized): Secondary | ICD-10-CM | POA: Diagnosis not present

## 2023-09-11 DIAGNOSIS — Z7409 Other reduced mobility: Secondary | ICD-10-CM | POA: Diagnosis not present

## 2023-09-11 DIAGNOSIS — E0840 Diabetes mellitus due to underlying condition with diabetic neuropathy, unspecified: Secondary | ICD-10-CM | POA: Diagnosis not present

## 2023-09-11 DIAGNOSIS — I5040 Unspecified combined systolic (congestive) and diastolic (congestive) heart failure: Secondary | ICD-10-CM | POA: Diagnosis not present

## 2023-09-11 DIAGNOSIS — J449 Chronic obstructive pulmonary disease, unspecified: Secondary | ICD-10-CM | POA: Diagnosis not present

## 2023-09-14 DIAGNOSIS — M6281 Muscle weakness (generalized): Secondary | ICD-10-CM | POA: Diagnosis not present

## 2023-09-14 DIAGNOSIS — E0840 Diabetes mellitus due to underlying condition with diabetic neuropathy, unspecified: Secondary | ICD-10-CM | POA: Diagnosis not present

## 2023-09-14 DIAGNOSIS — Z7409 Other reduced mobility: Secondary | ICD-10-CM | POA: Diagnosis not present

## 2023-09-14 DIAGNOSIS — I5040 Unspecified combined systolic (congestive) and diastolic (congestive) heart failure: Secondary | ICD-10-CM | POA: Diagnosis not present

## 2023-09-14 DIAGNOSIS — J449 Chronic obstructive pulmonary disease, unspecified: Secondary | ICD-10-CM | POA: Diagnosis not present

## 2023-09-15 DIAGNOSIS — M6281 Muscle weakness (generalized): Secondary | ICD-10-CM | POA: Diagnosis not present

## 2023-09-15 DIAGNOSIS — J449 Chronic obstructive pulmonary disease, unspecified: Secondary | ICD-10-CM | POA: Diagnosis not present

## 2023-09-15 DIAGNOSIS — I5040 Unspecified combined systolic (congestive) and diastolic (congestive) heart failure: Secondary | ICD-10-CM | POA: Diagnosis not present

## 2023-09-15 DIAGNOSIS — E0840 Diabetes mellitus due to underlying condition with diabetic neuropathy, unspecified: Secondary | ICD-10-CM | POA: Diagnosis not present

## 2023-09-16 DIAGNOSIS — J449 Chronic obstructive pulmonary disease, unspecified: Secondary | ICD-10-CM | POA: Diagnosis not present

## 2023-09-16 DIAGNOSIS — E0840 Diabetes mellitus due to underlying condition with diabetic neuropathy, unspecified: Secondary | ICD-10-CM | POA: Diagnosis not present

## 2023-09-16 DIAGNOSIS — Z7409 Other reduced mobility: Secondary | ICD-10-CM | POA: Diagnosis not present

## 2023-09-16 DIAGNOSIS — I5040 Unspecified combined systolic (congestive) and diastolic (congestive) heart failure: Secondary | ICD-10-CM | POA: Diagnosis not present

## 2023-09-16 DIAGNOSIS — M6281 Muscle weakness (generalized): Secondary | ICD-10-CM | POA: Diagnosis not present

## 2023-09-17 DIAGNOSIS — E0840 Diabetes mellitus due to underlying condition with diabetic neuropathy, unspecified: Secondary | ICD-10-CM | POA: Diagnosis not present

## 2023-09-17 DIAGNOSIS — J449 Chronic obstructive pulmonary disease, unspecified: Secondary | ICD-10-CM | POA: Diagnosis not present

## 2023-09-17 DIAGNOSIS — I5040 Unspecified combined systolic (congestive) and diastolic (congestive) heart failure: Secondary | ICD-10-CM | POA: Diagnosis not present

## 2023-09-17 DIAGNOSIS — M6281 Muscle weakness (generalized): Secondary | ICD-10-CM | POA: Diagnosis not present

## 2023-09-18 DIAGNOSIS — J449 Chronic obstructive pulmonary disease, unspecified: Secondary | ICD-10-CM | POA: Diagnosis not present

## 2023-09-18 DIAGNOSIS — Z7409 Other reduced mobility: Secondary | ICD-10-CM | POA: Diagnosis not present

## 2023-09-18 DIAGNOSIS — I5040 Unspecified combined systolic (congestive) and diastolic (congestive) heart failure: Secondary | ICD-10-CM | POA: Diagnosis not present

## 2023-09-18 DIAGNOSIS — M6281 Muscle weakness (generalized): Secondary | ICD-10-CM | POA: Diagnosis not present

## 2023-09-18 DIAGNOSIS — E0840 Diabetes mellitus due to underlying condition with diabetic neuropathy, unspecified: Secondary | ICD-10-CM | POA: Diagnosis not present

## 2023-09-19 DIAGNOSIS — E0840 Diabetes mellitus due to underlying condition with diabetic neuropathy, unspecified: Secondary | ICD-10-CM | POA: Diagnosis not present

## 2023-09-19 DIAGNOSIS — M6281 Muscle weakness (generalized): Secondary | ICD-10-CM | POA: Diagnosis not present

## 2023-09-19 DIAGNOSIS — I5040 Unspecified combined systolic (congestive) and diastolic (congestive) heart failure: Secondary | ICD-10-CM | POA: Diagnosis not present

## 2023-09-19 DIAGNOSIS — J449 Chronic obstructive pulmonary disease, unspecified: Secondary | ICD-10-CM | POA: Diagnosis not present

## 2023-09-22 DIAGNOSIS — M6281 Muscle weakness (generalized): Secondary | ICD-10-CM | POA: Diagnosis not present

## 2023-09-22 DIAGNOSIS — E0840 Diabetes mellitus due to underlying condition with diabetic neuropathy, unspecified: Secondary | ICD-10-CM | POA: Diagnosis not present

## 2023-09-22 DIAGNOSIS — I5040 Unspecified combined systolic (congestive) and diastolic (congestive) heart failure: Secondary | ICD-10-CM | POA: Diagnosis not present

## 2023-09-22 DIAGNOSIS — J449 Chronic obstructive pulmonary disease, unspecified: Secondary | ICD-10-CM | POA: Diagnosis not present

## 2023-09-23 DIAGNOSIS — I110 Hypertensive heart disease with heart failure: Secondary | ICD-10-CM | POA: Diagnosis not present

## 2023-09-23 DIAGNOSIS — J449 Chronic obstructive pulmonary disease, unspecified: Secondary | ICD-10-CM | POA: Diagnosis not present

## 2023-09-23 DIAGNOSIS — M6281 Muscle weakness (generalized): Secondary | ICD-10-CM | POA: Diagnosis not present

## 2023-09-23 DIAGNOSIS — E0840 Diabetes mellitus due to underlying condition with diabetic neuropathy, unspecified: Secondary | ICD-10-CM | POA: Diagnosis not present

## 2023-09-23 DIAGNOSIS — I5040 Unspecified combined systolic (congestive) and diastolic (congestive) heart failure: Secondary | ICD-10-CM | POA: Diagnosis not present

## 2023-09-23 DIAGNOSIS — Z7409 Other reduced mobility: Secondary | ICD-10-CM | POA: Diagnosis not present

## 2023-09-24 DIAGNOSIS — E0840 Diabetes mellitus due to underlying condition with diabetic neuropathy, unspecified: Secondary | ICD-10-CM | POA: Diagnosis not present

## 2023-09-24 DIAGNOSIS — I5040 Unspecified combined systolic (congestive) and diastolic (congestive) heart failure: Secondary | ICD-10-CM | POA: Diagnosis not present

## 2023-09-24 DIAGNOSIS — J449 Chronic obstructive pulmonary disease, unspecified: Secondary | ICD-10-CM | POA: Diagnosis not present

## 2023-09-24 DIAGNOSIS — M6281 Muscle weakness (generalized): Secondary | ICD-10-CM | POA: Diagnosis not present

## 2023-09-27 DIAGNOSIS — M6281 Muscle weakness (generalized): Secondary | ICD-10-CM | POA: Diagnosis not present

## 2023-09-27 DIAGNOSIS — J449 Chronic obstructive pulmonary disease, unspecified: Secondary | ICD-10-CM | POA: Diagnosis not present

## 2023-09-27 DIAGNOSIS — E0840 Diabetes mellitus due to underlying condition with diabetic neuropathy, unspecified: Secondary | ICD-10-CM | POA: Diagnosis not present

## 2023-09-27 DIAGNOSIS — I5040 Unspecified combined systolic (congestive) and diastolic (congestive) heart failure: Secondary | ICD-10-CM | POA: Diagnosis not present

## 2023-09-28 DIAGNOSIS — I480 Paroxysmal atrial fibrillation: Secondary | ICD-10-CM | POA: Diagnosis not present

## 2023-09-28 DIAGNOSIS — I5040 Unspecified combined systolic (congestive) and diastolic (congestive) heart failure: Secondary | ICD-10-CM | POA: Diagnosis not present

## 2023-09-28 DIAGNOSIS — E119 Type 2 diabetes mellitus without complications: Secondary | ICD-10-CM | POA: Diagnosis not present

## 2023-09-28 DIAGNOSIS — J449 Chronic obstructive pulmonary disease, unspecified: Secondary | ICD-10-CM | POA: Diagnosis not present

## 2023-09-28 DIAGNOSIS — I10 Essential (primary) hypertension: Secondary | ICD-10-CM | POA: Diagnosis not present

## 2023-09-28 DIAGNOSIS — M6281 Muscle weakness (generalized): Secondary | ICD-10-CM | POA: Diagnosis not present

## 2023-09-28 DIAGNOSIS — E785 Hyperlipidemia, unspecified: Secondary | ICD-10-CM | POA: Diagnosis not present

## 2023-09-28 DIAGNOSIS — E0840 Diabetes mellitus due to underlying condition with diabetic neuropathy, unspecified: Secondary | ICD-10-CM | POA: Diagnosis not present

## 2023-09-29 DIAGNOSIS — Z720 Tobacco use: Secondary | ICD-10-CM | POA: Diagnosis not present

## 2023-09-29 DIAGNOSIS — F419 Anxiety disorder, unspecified: Secondary | ICD-10-CM | POA: Diagnosis not present

## 2023-09-29 DIAGNOSIS — G4700 Insomnia, unspecified: Secondary | ICD-10-CM | POA: Diagnosis not present

## 2023-09-29 DIAGNOSIS — F329 Major depressive disorder, single episode, unspecified: Secondary | ICD-10-CM | POA: Diagnosis not present

## 2023-09-30 DIAGNOSIS — E0840 Diabetes mellitus due to underlying condition with diabetic neuropathy, unspecified: Secondary | ICD-10-CM | POA: Diagnosis not present

## 2023-09-30 DIAGNOSIS — I110 Hypertensive heart disease with heart failure: Secondary | ICD-10-CM | POA: Diagnosis not present

## 2023-09-30 DIAGNOSIS — Z7409 Other reduced mobility: Secondary | ICD-10-CM | POA: Diagnosis not present

## 2023-09-30 DIAGNOSIS — I5040 Unspecified combined systolic (congestive) and diastolic (congestive) heart failure: Secondary | ICD-10-CM | POA: Diagnosis not present

## 2023-09-30 DIAGNOSIS — J449 Chronic obstructive pulmonary disease, unspecified: Secondary | ICD-10-CM | POA: Diagnosis not present

## 2023-09-30 DIAGNOSIS — M6281 Muscle weakness (generalized): Secondary | ICD-10-CM | POA: Diagnosis not present

## 2023-10-02 DIAGNOSIS — M6281 Muscle weakness (generalized): Secondary | ICD-10-CM | POA: Diagnosis not present

## 2023-10-02 DIAGNOSIS — J449 Chronic obstructive pulmonary disease, unspecified: Secondary | ICD-10-CM | POA: Diagnosis not present

## 2023-10-02 DIAGNOSIS — E0840 Diabetes mellitus due to underlying condition with diabetic neuropathy, unspecified: Secondary | ICD-10-CM | POA: Diagnosis not present

## 2023-10-02 DIAGNOSIS — I5040 Unspecified combined systolic (congestive) and diastolic (congestive) heart failure: Secondary | ICD-10-CM | POA: Diagnosis not present

## 2023-10-03 DIAGNOSIS — I5040 Unspecified combined systolic (congestive) and diastolic (congestive) heart failure: Secondary | ICD-10-CM | POA: Diagnosis not present

## 2023-10-03 DIAGNOSIS — Z7409 Other reduced mobility: Secondary | ICD-10-CM | POA: Diagnosis not present

## 2023-10-03 DIAGNOSIS — E0840 Diabetes mellitus due to underlying condition with diabetic neuropathy, unspecified: Secondary | ICD-10-CM | POA: Diagnosis not present

## 2023-10-03 DIAGNOSIS — M6281 Muscle weakness (generalized): Secondary | ICD-10-CM | POA: Diagnosis not present

## 2023-10-03 DIAGNOSIS — J449 Chronic obstructive pulmonary disease, unspecified: Secondary | ICD-10-CM | POA: Diagnosis not present

## 2023-10-04 DIAGNOSIS — E0840 Diabetes mellitus due to underlying condition with diabetic neuropathy, unspecified: Secondary | ICD-10-CM | POA: Diagnosis not present

## 2023-10-04 DIAGNOSIS — J449 Chronic obstructive pulmonary disease, unspecified: Secondary | ICD-10-CM | POA: Diagnosis not present

## 2023-10-04 DIAGNOSIS — I5040 Unspecified combined systolic (congestive) and diastolic (congestive) heart failure: Secondary | ICD-10-CM | POA: Diagnosis not present

## 2023-10-04 DIAGNOSIS — Z7409 Other reduced mobility: Secondary | ICD-10-CM | POA: Diagnosis not present

## 2023-10-05 DIAGNOSIS — M6281 Muscle weakness (generalized): Secondary | ICD-10-CM | POA: Diagnosis not present

## 2023-10-05 DIAGNOSIS — E0840 Diabetes mellitus due to underlying condition with diabetic neuropathy, unspecified: Secondary | ICD-10-CM | POA: Diagnosis not present

## 2023-10-05 DIAGNOSIS — J449 Chronic obstructive pulmonary disease, unspecified: Secondary | ICD-10-CM | POA: Diagnosis not present

## 2023-10-05 DIAGNOSIS — I5040 Unspecified combined systolic (congestive) and diastolic (congestive) heart failure: Secondary | ICD-10-CM | POA: Diagnosis not present

## 2023-10-05 DIAGNOSIS — Z7409 Other reduced mobility: Secondary | ICD-10-CM | POA: Diagnosis not present

## 2023-10-06 DIAGNOSIS — R1310 Dysphagia, unspecified: Secondary | ICD-10-CM | POA: Diagnosis not present

## 2023-10-06 DIAGNOSIS — E0840 Diabetes mellitus due to underlying condition with diabetic neuropathy, unspecified: Secondary | ICD-10-CM | POA: Diagnosis not present

## 2023-10-06 DIAGNOSIS — M6281 Muscle weakness (generalized): Secondary | ICD-10-CM | POA: Diagnosis not present

## 2023-10-06 DIAGNOSIS — E119 Type 2 diabetes mellitus without complications: Secondary | ICD-10-CM | POA: Diagnosis not present

## 2023-10-06 DIAGNOSIS — I5040 Unspecified combined systolic (congestive) and diastolic (congestive) heart failure: Secondary | ICD-10-CM | POA: Diagnosis not present

## 2023-10-06 DIAGNOSIS — J449 Chronic obstructive pulmonary disease, unspecified: Secondary | ICD-10-CM | POA: Diagnosis not present

## 2023-10-06 DIAGNOSIS — R1311 Dysphagia, oral phase: Secondary | ICD-10-CM | POA: Diagnosis not present

## 2023-10-06 DIAGNOSIS — Z7409 Other reduced mobility: Secondary | ICD-10-CM | POA: Diagnosis not present

## 2023-10-07 DIAGNOSIS — R1311 Dysphagia, oral phase: Secondary | ICD-10-CM | POA: Diagnosis not present

## 2023-10-07 DIAGNOSIS — M6281 Muscle weakness (generalized): Secondary | ICD-10-CM | POA: Diagnosis not present

## 2023-10-07 DIAGNOSIS — R1310 Dysphagia, unspecified: Secondary | ICD-10-CM | POA: Diagnosis not present

## 2023-10-07 DIAGNOSIS — R600 Localized edema: Secondary | ICD-10-CM | POA: Diagnosis not present

## 2023-10-07 DIAGNOSIS — E0840 Diabetes mellitus due to underlying condition with diabetic neuropathy, unspecified: Secondary | ICD-10-CM | POA: Diagnosis not present

## 2023-10-07 DIAGNOSIS — E1165 Type 2 diabetes mellitus with hyperglycemia: Secondary | ICD-10-CM | POA: Diagnosis not present

## 2023-10-07 DIAGNOSIS — I5040 Unspecified combined systolic (congestive) and diastolic (congestive) heart failure: Secondary | ICD-10-CM | POA: Diagnosis not present

## 2023-10-07 DIAGNOSIS — I10 Essential (primary) hypertension: Secondary | ICD-10-CM | POA: Diagnosis not present

## 2023-10-07 DIAGNOSIS — J449 Chronic obstructive pulmonary disease, unspecified: Secondary | ICD-10-CM | POA: Diagnosis not present

## 2023-10-07 DIAGNOSIS — Z7409 Other reduced mobility: Secondary | ICD-10-CM | POA: Diagnosis not present

## 2023-10-08 DIAGNOSIS — Z7409 Other reduced mobility: Secondary | ICD-10-CM | POA: Diagnosis not present

## 2023-10-08 DIAGNOSIS — E0840 Diabetes mellitus due to underlying condition with diabetic neuropathy, unspecified: Secondary | ICD-10-CM | POA: Diagnosis not present

## 2023-10-08 DIAGNOSIS — I5040 Unspecified combined systolic (congestive) and diastolic (congestive) heart failure: Secondary | ICD-10-CM | POA: Diagnosis not present

## 2023-10-08 DIAGNOSIS — R1311 Dysphagia, oral phase: Secondary | ICD-10-CM | POA: Diagnosis not present

## 2023-10-08 DIAGNOSIS — J449 Chronic obstructive pulmonary disease, unspecified: Secondary | ICD-10-CM | POA: Diagnosis not present

## 2023-10-08 DIAGNOSIS — R1310 Dysphagia, unspecified: Secondary | ICD-10-CM | POA: Diagnosis not present

## 2023-10-11 DIAGNOSIS — M6281 Muscle weakness (generalized): Secondary | ICD-10-CM | POA: Diagnosis not present

## 2023-10-11 DIAGNOSIS — I5040 Unspecified combined systolic (congestive) and diastolic (congestive) heart failure: Secondary | ICD-10-CM | POA: Diagnosis not present

## 2023-10-11 DIAGNOSIS — R1310 Dysphagia, unspecified: Secondary | ICD-10-CM | POA: Diagnosis not present

## 2023-10-11 DIAGNOSIS — J449 Chronic obstructive pulmonary disease, unspecified: Secondary | ICD-10-CM | POA: Diagnosis not present

## 2023-10-11 DIAGNOSIS — E0840 Diabetes mellitus due to underlying condition with diabetic neuropathy, unspecified: Secondary | ICD-10-CM | POA: Diagnosis not present

## 2023-10-11 DIAGNOSIS — Z7409 Other reduced mobility: Secondary | ICD-10-CM | POA: Diagnosis not present

## 2023-10-11 DIAGNOSIS — R1311 Dysphagia, oral phase: Secondary | ICD-10-CM | POA: Diagnosis not present

## 2023-10-12 DIAGNOSIS — E0840 Diabetes mellitus due to underlying condition with diabetic neuropathy, unspecified: Secondary | ICD-10-CM | POA: Diagnosis not present

## 2023-10-12 DIAGNOSIS — Z7409 Other reduced mobility: Secondary | ICD-10-CM | POA: Diagnosis not present

## 2023-10-12 DIAGNOSIS — R1311 Dysphagia, oral phase: Secondary | ICD-10-CM | POA: Diagnosis not present

## 2023-10-12 DIAGNOSIS — I5040 Unspecified combined systolic (congestive) and diastolic (congestive) heart failure: Secondary | ICD-10-CM | POA: Diagnosis not present

## 2023-10-12 DIAGNOSIS — R1310 Dysphagia, unspecified: Secondary | ICD-10-CM | POA: Diagnosis not present

## 2023-10-12 DIAGNOSIS — M6281 Muscle weakness (generalized): Secondary | ICD-10-CM | POA: Diagnosis not present

## 2023-10-12 DIAGNOSIS — J449 Chronic obstructive pulmonary disease, unspecified: Secondary | ICD-10-CM | POA: Diagnosis not present

## 2023-10-13 DIAGNOSIS — R1310 Dysphagia, unspecified: Secondary | ICD-10-CM | POA: Diagnosis not present

## 2023-10-13 DIAGNOSIS — J449 Chronic obstructive pulmonary disease, unspecified: Secondary | ICD-10-CM | POA: Diagnosis not present

## 2023-10-13 DIAGNOSIS — Z7409 Other reduced mobility: Secondary | ICD-10-CM | POA: Diagnosis not present

## 2023-10-13 DIAGNOSIS — R1311 Dysphagia, oral phase: Secondary | ICD-10-CM | POA: Diagnosis not present

## 2023-10-13 DIAGNOSIS — M6281 Muscle weakness (generalized): Secondary | ICD-10-CM | POA: Diagnosis not present

## 2023-10-13 DIAGNOSIS — E0840 Diabetes mellitus due to underlying condition with diabetic neuropathy, unspecified: Secondary | ICD-10-CM | POA: Diagnosis not present

## 2023-10-13 DIAGNOSIS — I5040 Unspecified combined systolic (congestive) and diastolic (congestive) heart failure: Secondary | ICD-10-CM | POA: Diagnosis not present

## 2023-10-14 DIAGNOSIS — I5040 Unspecified combined systolic (congestive) and diastolic (congestive) heart failure: Secondary | ICD-10-CM | POA: Diagnosis not present

## 2023-10-14 DIAGNOSIS — E0840 Diabetes mellitus due to underlying condition with diabetic neuropathy, unspecified: Secondary | ICD-10-CM | POA: Diagnosis not present

## 2023-10-14 DIAGNOSIS — R1310 Dysphagia, unspecified: Secondary | ICD-10-CM | POA: Diagnosis not present

## 2023-10-14 DIAGNOSIS — M6281 Muscle weakness (generalized): Secondary | ICD-10-CM | POA: Diagnosis not present

## 2023-10-14 DIAGNOSIS — Z7409 Other reduced mobility: Secondary | ICD-10-CM | POA: Diagnosis not present

## 2023-10-14 DIAGNOSIS — J449 Chronic obstructive pulmonary disease, unspecified: Secondary | ICD-10-CM | POA: Diagnosis not present

## 2023-10-14 DIAGNOSIS — R1311 Dysphagia, oral phase: Secondary | ICD-10-CM | POA: Diagnosis not present

## 2023-10-15 DIAGNOSIS — M6281 Muscle weakness (generalized): Secondary | ICD-10-CM | POA: Diagnosis not present

## 2023-10-15 DIAGNOSIS — I5040 Unspecified combined systolic (congestive) and diastolic (congestive) heart failure: Secondary | ICD-10-CM | POA: Diagnosis not present

## 2023-10-15 DIAGNOSIS — R1310 Dysphagia, unspecified: Secondary | ICD-10-CM | POA: Diagnosis not present

## 2023-10-15 DIAGNOSIS — E0840 Diabetes mellitus due to underlying condition with diabetic neuropathy, unspecified: Secondary | ICD-10-CM | POA: Diagnosis not present

## 2023-10-15 DIAGNOSIS — J449 Chronic obstructive pulmonary disease, unspecified: Secondary | ICD-10-CM | POA: Diagnosis not present

## 2023-10-15 DIAGNOSIS — R1311 Dysphagia, oral phase: Secondary | ICD-10-CM | POA: Diagnosis not present

## 2023-10-15 DIAGNOSIS — Z7409 Other reduced mobility: Secondary | ICD-10-CM | POA: Diagnosis not present

## 2023-10-16 DIAGNOSIS — E0840 Diabetes mellitus due to underlying condition with diabetic neuropathy, unspecified: Secondary | ICD-10-CM | POA: Diagnosis not present

## 2023-10-16 DIAGNOSIS — R1310 Dysphagia, unspecified: Secondary | ICD-10-CM | POA: Diagnosis not present

## 2023-10-16 DIAGNOSIS — Z7409 Other reduced mobility: Secondary | ICD-10-CM | POA: Diagnosis not present

## 2023-10-16 DIAGNOSIS — J449 Chronic obstructive pulmonary disease, unspecified: Secondary | ICD-10-CM | POA: Diagnosis not present

## 2023-10-16 DIAGNOSIS — I5040 Unspecified combined systolic (congestive) and diastolic (congestive) heart failure: Secondary | ICD-10-CM | POA: Diagnosis not present

## 2023-10-17 DIAGNOSIS — Z7409 Other reduced mobility: Secondary | ICD-10-CM | POA: Diagnosis not present

## 2023-10-17 DIAGNOSIS — J449 Chronic obstructive pulmonary disease, unspecified: Secondary | ICD-10-CM | POA: Diagnosis not present

## 2023-10-17 DIAGNOSIS — R1310 Dysphagia, unspecified: Secondary | ICD-10-CM | POA: Diagnosis not present

## 2023-10-17 DIAGNOSIS — I5040 Unspecified combined systolic (congestive) and diastolic (congestive) heart failure: Secondary | ICD-10-CM | POA: Diagnosis not present

## 2023-10-17 DIAGNOSIS — E0840 Diabetes mellitus due to underlying condition with diabetic neuropathy, unspecified: Secondary | ICD-10-CM | POA: Diagnosis not present

## 2023-10-18 DIAGNOSIS — E0840 Diabetes mellitus due to underlying condition with diabetic neuropathy, unspecified: Secondary | ICD-10-CM | POA: Diagnosis not present

## 2023-10-18 DIAGNOSIS — R1311 Dysphagia, oral phase: Secondary | ICD-10-CM | POA: Diagnosis not present

## 2023-10-18 DIAGNOSIS — M6281 Muscle weakness (generalized): Secondary | ICD-10-CM | POA: Diagnosis not present

## 2023-10-18 DIAGNOSIS — J449 Chronic obstructive pulmonary disease, unspecified: Secondary | ICD-10-CM | POA: Diagnosis not present

## 2023-10-18 DIAGNOSIS — Z7409 Other reduced mobility: Secondary | ICD-10-CM | POA: Diagnosis not present

## 2023-10-18 DIAGNOSIS — I5040 Unspecified combined systolic (congestive) and diastolic (congestive) heart failure: Secondary | ICD-10-CM | POA: Diagnosis not present

## 2023-10-18 DIAGNOSIS — R1310 Dysphagia, unspecified: Secondary | ICD-10-CM | POA: Diagnosis not present

## 2023-10-19 DIAGNOSIS — M6281 Muscle weakness (generalized): Secondary | ICD-10-CM | POA: Diagnosis not present

## 2023-10-19 DIAGNOSIS — I5040 Unspecified combined systolic (congestive) and diastolic (congestive) heart failure: Secondary | ICD-10-CM | POA: Diagnosis not present

## 2023-10-19 DIAGNOSIS — R1310 Dysphagia, unspecified: Secondary | ICD-10-CM | POA: Diagnosis not present

## 2023-10-19 DIAGNOSIS — J449 Chronic obstructive pulmonary disease, unspecified: Secondary | ICD-10-CM | POA: Diagnosis not present

## 2023-10-19 DIAGNOSIS — Z7409 Other reduced mobility: Secondary | ICD-10-CM | POA: Diagnosis not present

## 2023-10-19 DIAGNOSIS — R1311 Dysphagia, oral phase: Secondary | ICD-10-CM | POA: Diagnosis not present

## 2023-10-19 DIAGNOSIS — E0840 Diabetes mellitus due to underlying condition with diabetic neuropathy, unspecified: Secondary | ICD-10-CM | POA: Diagnosis not present

## 2023-10-20 DIAGNOSIS — M6281 Muscle weakness (generalized): Secondary | ICD-10-CM | POA: Diagnosis not present

## 2023-10-20 DIAGNOSIS — I5040 Unspecified combined systolic (congestive) and diastolic (congestive) heart failure: Secondary | ICD-10-CM | POA: Diagnosis not present

## 2023-10-20 DIAGNOSIS — E0840 Diabetes mellitus due to underlying condition with diabetic neuropathy, unspecified: Secondary | ICD-10-CM | POA: Diagnosis not present

## 2023-10-20 DIAGNOSIS — J449 Chronic obstructive pulmonary disease, unspecified: Secondary | ICD-10-CM | POA: Diagnosis not present

## 2023-10-20 DIAGNOSIS — R1310 Dysphagia, unspecified: Secondary | ICD-10-CM | POA: Diagnosis not present

## 2023-10-20 DIAGNOSIS — R1311 Dysphagia, oral phase: Secondary | ICD-10-CM | POA: Diagnosis not present

## 2023-10-20 DIAGNOSIS — Z7409 Other reduced mobility: Secondary | ICD-10-CM | POA: Diagnosis not present

## 2023-10-21 DIAGNOSIS — Z7409 Other reduced mobility: Secondary | ICD-10-CM | POA: Diagnosis not present

## 2023-10-21 DIAGNOSIS — R1310 Dysphagia, unspecified: Secondary | ICD-10-CM | POA: Diagnosis not present

## 2023-10-21 DIAGNOSIS — I5040 Unspecified combined systolic (congestive) and diastolic (congestive) heart failure: Secondary | ICD-10-CM | POA: Diagnosis not present

## 2023-10-21 DIAGNOSIS — R1311 Dysphagia, oral phase: Secondary | ICD-10-CM | POA: Diagnosis not present

## 2023-10-21 DIAGNOSIS — J449 Chronic obstructive pulmonary disease, unspecified: Secondary | ICD-10-CM | POA: Diagnosis not present

## 2023-10-21 DIAGNOSIS — M6281 Muscle weakness (generalized): Secondary | ICD-10-CM | POA: Diagnosis not present

## 2023-10-21 DIAGNOSIS — E0840 Diabetes mellitus due to underlying condition with diabetic neuropathy, unspecified: Secondary | ICD-10-CM | POA: Diagnosis not present

## 2023-10-22 DIAGNOSIS — J449 Chronic obstructive pulmonary disease, unspecified: Secondary | ICD-10-CM | POA: Diagnosis not present

## 2023-10-22 DIAGNOSIS — I110 Hypertensive heart disease with heart failure: Secondary | ICD-10-CM | POA: Diagnosis not present

## 2023-10-22 DIAGNOSIS — I5040 Unspecified combined systolic (congestive) and diastolic (congestive) heart failure: Secondary | ICD-10-CM | POA: Diagnosis not present

## 2023-10-22 DIAGNOSIS — Z7409 Other reduced mobility: Secondary | ICD-10-CM | POA: Diagnosis not present

## 2023-10-22 DIAGNOSIS — M6281 Muscle weakness (generalized): Secondary | ICD-10-CM | POA: Diagnosis not present

## 2023-10-22 DIAGNOSIS — R1310 Dysphagia, unspecified: Secondary | ICD-10-CM | POA: Diagnosis not present

## 2023-10-22 DIAGNOSIS — E0840 Diabetes mellitus due to underlying condition with diabetic neuropathy, unspecified: Secondary | ICD-10-CM | POA: Diagnosis not present

## 2023-10-24 DIAGNOSIS — J811 Chronic pulmonary edema: Secondary | ICD-10-CM | POA: Diagnosis not present

## 2023-10-25 DIAGNOSIS — R1311 Dysphagia, oral phase: Secondary | ICD-10-CM | POA: Diagnosis not present

## 2023-10-25 DIAGNOSIS — Z7409 Other reduced mobility: Secondary | ICD-10-CM | POA: Diagnosis not present

## 2023-10-25 DIAGNOSIS — E0840 Diabetes mellitus due to underlying condition with diabetic neuropathy, unspecified: Secondary | ICD-10-CM | POA: Diagnosis not present

## 2023-10-25 DIAGNOSIS — M6281 Muscle weakness (generalized): Secondary | ICD-10-CM | POA: Diagnosis not present

## 2023-10-25 DIAGNOSIS — I5040 Unspecified combined systolic (congestive) and diastolic (congestive) heart failure: Secondary | ICD-10-CM | POA: Diagnosis not present

## 2023-10-25 DIAGNOSIS — R1310 Dysphagia, unspecified: Secondary | ICD-10-CM | POA: Diagnosis not present

## 2023-10-25 DIAGNOSIS — J449 Chronic obstructive pulmonary disease, unspecified: Secondary | ICD-10-CM | POA: Diagnosis not present

## 2023-10-26 DIAGNOSIS — E0840 Diabetes mellitus due to underlying condition with diabetic neuropathy, unspecified: Secondary | ICD-10-CM | POA: Diagnosis not present

## 2023-10-26 DIAGNOSIS — I5040 Unspecified combined systolic (congestive) and diastolic (congestive) heart failure: Secondary | ICD-10-CM | POA: Diagnosis not present

## 2023-10-26 DIAGNOSIS — R1311 Dysphagia, oral phase: Secondary | ICD-10-CM | POA: Diagnosis not present

## 2023-10-26 DIAGNOSIS — M6281 Muscle weakness (generalized): Secondary | ICD-10-CM | POA: Diagnosis not present

## 2023-10-26 DIAGNOSIS — I10 Essential (primary) hypertension: Secondary | ICD-10-CM | POA: Diagnosis not present

## 2023-10-26 DIAGNOSIS — J449 Chronic obstructive pulmonary disease, unspecified: Secondary | ICD-10-CM | POA: Diagnosis not present

## 2023-10-26 DIAGNOSIS — R1310 Dysphagia, unspecified: Secondary | ICD-10-CM | POA: Diagnosis not present

## 2023-10-26 DIAGNOSIS — Z7409 Other reduced mobility: Secondary | ICD-10-CM | POA: Diagnosis not present

## 2023-10-27 DIAGNOSIS — I5040 Unspecified combined systolic (congestive) and diastolic (congestive) heart failure: Secondary | ICD-10-CM | POA: Diagnosis not present

## 2023-10-27 DIAGNOSIS — R1310 Dysphagia, unspecified: Secondary | ICD-10-CM | POA: Diagnosis not present

## 2023-10-27 DIAGNOSIS — Z7409 Other reduced mobility: Secondary | ICD-10-CM | POA: Diagnosis not present

## 2023-10-27 DIAGNOSIS — R1311 Dysphagia, oral phase: Secondary | ICD-10-CM | POA: Diagnosis not present

## 2023-10-27 DIAGNOSIS — E0840 Diabetes mellitus due to underlying condition with diabetic neuropathy, unspecified: Secondary | ICD-10-CM | POA: Diagnosis not present

## 2023-10-27 DIAGNOSIS — M6281 Muscle weakness (generalized): Secondary | ICD-10-CM | POA: Diagnosis not present

## 2023-10-27 DIAGNOSIS — J449 Chronic obstructive pulmonary disease, unspecified: Secondary | ICD-10-CM | POA: Diagnosis not present

## 2023-10-28 DIAGNOSIS — R1311 Dysphagia, oral phase: Secondary | ICD-10-CM | POA: Diagnosis not present

## 2023-10-28 DIAGNOSIS — R1310 Dysphagia, unspecified: Secondary | ICD-10-CM | POA: Diagnosis not present

## 2023-10-28 DIAGNOSIS — J449 Chronic obstructive pulmonary disease, unspecified: Secondary | ICD-10-CM | POA: Diagnosis not present

## 2023-10-28 DIAGNOSIS — I5040 Unspecified combined systolic (congestive) and diastolic (congestive) heart failure: Secondary | ICD-10-CM | POA: Diagnosis not present

## 2023-10-28 DIAGNOSIS — E0840 Diabetes mellitus due to underlying condition with diabetic neuropathy, unspecified: Secondary | ICD-10-CM | POA: Diagnosis not present

## 2023-10-28 DIAGNOSIS — I10 Essential (primary) hypertension: Secondary | ICD-10-CM | POA: Diagnosis not present

## 2023-10-28 DIAGNOSIS — M6281 Muscle weakness (generalized): Secondary | ICD-10-CM | POA: Diagnosis not present

## 2023-10-28 DIAGNOSIS — Z7409 Other reduced mobility: Secondary | ICD-10-CM | POA: Diagnosis not present

## 2023-10-29 DIAGNOSIS — Z7409 Other reduced mobility: Secondary | ICD-10-CM | POA: Diagnosis not present

## 2023-10-29 DIAGNOSIS — E0840 Diabetes mellitus due to underlying condition with diabetic neuropathy, unspecified: Secondary | ICD-10-CM | POA: Diagnosis not present

## 2023-10-29 DIAGNOSIS — I5040 Unspecified combined systolic (congestive) and diastolic (congestive) heart failure: Secondary | ICD-10-CM | POA: Diagnosis not present

## 2023-10-29 DIAGNOSIS — J449 Chronic obstructive pulmonary disease, unspecified: Secondary | ICD-10-CM | POA: Diagnosis not present

## 2023-10-29 DIAGNOSIS — R1311 Dysphagia, oral phase: Secondary | ICD-10-CM | POA: Diagnosis not present

## 2023-10-29 DIAGNOSIS — R1310 Dysphagia, unspecified: Secondary | ICD-10-CM | POA: Diagnosis not present

## 2023-10-29 DIAGNOSIS — M6281 Muscle weakness (generalized): Secondary | ICD-10-CM | POA: Diagnosis not present

## 2023-10-30 DIAGNOSIS — I5040 Unspecified combined systolic (congestive) and diastolic (congestive) heart failure: Secondary | ICD-10-CM | POA: Diagnosis not present

## 2023-10-30 DIAGNOSIS — R739 Hyperglycemia, unspecified: Secondary | ICD-10-CM | POA: Diagnosis not present

## 2023-10-30 DIAGNOSIS — E0840 Diabetes mellitus due to underlying condition with diabetic neuropathy, unspecified: Secondary | ICD-10-CM | POA: Diagnosis not present

## 2023-10-30 DIAGNOSIS — M6281 Muscle weakness (generalized): Secondary | ICD-10-CM | POA: Diagnosis not present

## 2023-10-30 DIAGNOSIS — Z7409 Other reduced mobility: Secondary | ICD-10-CM | POA: Diagnosis not present

## 2023-10-30 DIAGNOSIS — J449 Chronic obstructive pulmonary disease, unspecified: Secondary | ICD-10-CM | POA: Diagnosis not present

## 2023-10-30 DIAGNOSIS — R1310 Dysphagia, unspecified: Secondary | ICD-10-CM | POA: Diagnosis not present

## 2023-11-01 DIAGNOSIS — Z7409 Other reduced mobility: Secondary | ICD-10-CM | POA: Diagnosis not present

## 2023-11-01 DIAGNOSIS — R1310 Dysphagia, unspecified: Secondary | ICD-10-CM | POA: Diagnosis not present

## 2023-11-01 DIAGNOSIS — M6281 Muscle weakness (generalized): Secondary | ICD-10-CM | POA: Diagnosis not present

## 2023-11-01 DIAGNOSIS — I10 Essential (primary) hypertension: Secondary | ICD-10-CM | POA: Diagnosis not present

## 2023-11-01 DIAGNOSIS — I5040 Unspecified combined systolic (congestive) and diastolic (congestive) heart failure: Secondary | ICD-10-CM | POA: Diagnosis not present

## 2023-11-01 DIAGNOSIS — R1311 Dysphagia, oral phase: Secondary | ICD-10-CM | POA: Diagnosis not present

## 2023-11-01 DIAGNOSIS — J449 Chronic obstructive pulmonary disease, unspecified: Secondary | ICD-10-CM | POA: Diagnosis not present

## 2023-11-01 DIAGNOSIS — E0840 Diabetes mellitus due to underlying condition with diabetic neuropathy, unspecified: Secondary | ICD-10-CM | POA: Diagnosis not present

## 2023-11-02 DIAGNOSIS — J449 Chronic obstructive pulmonary disease, unspecified: Secondary | ICD-10-CM | POA: Diagnosis not present

## 2023-11-02 DIAGNOSIS — M6281 Muscle weakness (generalized): Secondary | ICD-10-CM | POA: Diagnosis not present

## 2023-11-02 DIAGNOSIS — E0840 Diabetes mellitus due to underlying condition with diabetic neuropathy, unspecified: Secondary | ICD-10-CM | POA: Diagnosis not present

## 2023-11-02 DIAGNOSIS — Z7409 Other reduced mobility: Secondary | ICD-10-CM | POA: Diagnosis not present

## 2023-11-02 DIAGNOSIS — G4700 Insomnia, unspecified: Secondary | ICD-10-CM | POA: Diagnosis not present

## 2023-11-02 DIAGNOSIS — F419 Anxiety disorder, unspecified: Secondary | ICD-10-CM | POA: Diagnosis not present

## 2023-11-02 DIAGNOSIS — R1310 Dysphagia, unspecified: Secondary | ICD-10-CM | POA: Diagnosis not present

## 2023-11-02 DIAGNOSIS — F329 Major depressive disorder, single episode, unspecified: Secondary | ICD-10-CM | POA: Diagnosis not present

## 2023-11-02 DIAGNOSIS — I5040 Unspecified combined systolic (congestive) and diastolic (congestive) heart failure: Secondary | ICD-10-CM | POA: Diagnosis not present

## 2023-11-02 DIAGNOSIS — R1311 Dysphagia, oral phase: Secondary | ICD-10-CM | POA: Diagnosis not present

## 2023-11-02 DIAGNOSIS — Z720 Tobacco use: Secondary | ICD-10-CM | POA: Diagnosis not present

## 2023-11-03 DIAGNOSIS — Z7409 Other reduced mobility: Secondary | ICD-10-CM | POA: Diagnosis not present

## 2023-11-03 DIAGNOSIS — M6281 Muscle weakness (generalized): Secondary | ICD-10-CM | POA: Diagnosis not present

## 2023-11-03 DIAGNOSIS — J449 Chronic obstructive pulmonary disease, unspecified: Secondary | ICD-10-CM | POA: Diagnosis not present

## 2023-11-03 DIAGNOSIS — R1310 Dysphagia, unspecified: Secondary | ICD-10-CM | POA: Diagnosis not present

## 2023-11-03 DIAGNOSIS — E0840 Diabetes mellitus due to underlying condition with diabetic neuropathy, unspecified: Secondary | ICD-10-CM | POA: Diagnosis not present

## 2023-11-03 DIAGNOSIS — I5040 Unspecified combined systolic (congestive) and diastolic (congestive) heart failure: Secondary | ICD-10-CM | POA: Diagnosis not present

## 2023-11-04 DIAGNOSIS — E1100 Type 2 diabetes mellitus with hyperosmolarity without nonketotic hyperglycemic-hyperosmolar coma (NKHHC): Secondary | ICD-10-CM | POA: Diagnosis not present

## 2023-11-04 DIAGNOSIS — E1165 Type 2 diabetes mellitus with hyperglycemia: Secondary | ICD-10-CM | POA: Diagnosis not present

## 2023-11-04 DIAGNOSIS — R1310 Dysphagia, unspecified: Secondary | ICD-10-CM | POA: Diagnosis not present

## 2023-11-04 DIAGNOSIS — R1311 Dysphagia, oral phase: Secondary | ICD-10-CM | POA: Diagnosis not present

## 2023-11-04 DIAGNOSIS — E559 Vitamin D deficiency, unspecified: Secondary | ICD-10-CM | POA: Diagnosis not present

## 2023-11-04 DIAGNOSIS — Z7409 Other reduced mobility: Secondary | ICD-10-CM | POA: Diagnosis not present

## 2023-11-04 DIAGNOSIS — J449 Chronic obstructive pulmonary disease, unspecified: Secondary | ICD-10-CM | POA: Diagnosis not present

## 2023-11-04 DIAGNOSIS — E785 Hyperlipidemia, unspecified: Secondary | ICD-10-CM | POA: Diagnosis not present

## 2023-11-04 DIAGNOSIS — E0840 Diabetes mellitus due to underlying condition with diabetic neuropathy, unspecified: Secondary | ICD-10-CM | POA: Diagnosis not present

## 2023-11-04 DIAGNOSIS — M6281 Muscle weakness (generalized): Secondary | ICD-10-CM | POA: Diagnosis not present

## 2023-11-04 DIAGNOSIS — I5040 Unspecified combined systolic (congestive) and diastolic (congestive) heart failure: Secondary | ICD-10-CM | POA: Diagnosis not present

## 2023-11-05 DIAGNOSIS — I5040 Unspecified combined systolic (congestive) and diastolic (congestive) heart failure: Secondary | ICD-10-CM | POA: Diagnosis not present

## 2023-11-05 DIAGNOSIS — M6281 Muscle weakness (generalized): Secondary | ICD-10-CM | POA: Diagnosis not present

## 2023-11-05 DIAGNOSIS — R1311 Dysphagia, oral phase: Secondary | ICD-10-CM | POA: Diagnosis not present

## 2023-11-05 DIAGNOSIS — R1310 Dysphagia, unspecified: Secondary | ICD-10-CM | POA: Diagnosis not present

## 2023-11-05 DIAGNOSIS — E0840 Diabetes mellitus due to underlying condition with diabetic neuropathy, unspecified: Secondary | ICD-10-CM | POA: Diagnosis not present

## 2023-11-05 DIAGNOSIS — J449 Chronic obstructive pulmonary disease, unspecified: Secondary | ICD-10-CM | POA: Diagnosis not present

## 2023-11-05 DIAGNOSIS — Z7409 Other reduced mobility: Secondary | ICD-10-CM | POA: Diagnosis not present

## 2023-11-06 DIAGNOSIS — E119 Type 2 diabetes mellitus without complications: Secondary | ICD-10-CM | POA: Diagnosis not present

## 2023-11-09 DIAGNOSIS — J449 Chronic obstructive pulmonary disease, unspecified: Secondary | ICD-10-CM | POA: Diagnosis not present

## 2023-11-09 DIAGNOSIS — I5040 Unspecified combined systolic (congestive) and diastolic (congestive) heart failure: Secondary | ICD-10-CM | POA: Diagnosis not present

## 2023-11-09 DIAGNOSIS — E0840 Diabetes mellitus due to underlying condition with diabetic neuropathy, unspecified: Secondary | ICD-10-CM | POA: Diagnosis not present

## 2023-11-09 DIAGNOSIS — R739 Hyperglycemia, unspecified: Secondary | ICD-10-CM | POA: Diagnosis not present

## 2023-11-09 DIAGNOSIS — I110 Hypertensive heart disease with heart failure: Secondary | ICD-10-CM | POA: Diagnosis not present

## 2023-11-11 DIAGNOSIS — M6281 Muscle weakness (generalized): Secondary | ICD-10-CM | POA: Diagnosis not present

## 2023-11-11 DIAGNOSIS — E1165 Type 2 diabetes mellitus with hyperglycemia: Secondary | ICD-10-CM | POA: Diagnosis not present

## 2023-11-11 DIAGNOSIS — R1311 Dysphagia, oral phase: Secondary | ICD-10-CM | POA: Diagnosis not present

## 2023-11-11 DIAGNOSIS — I10 Essential (primary) hypertension: Secondary | ICD-10-CM | POA: Diagnosis not present

## 2023-11-11 DIAGNOSIS — Z7409 Other reduced mobility: Secondary | ICD-10-CM | POA: Diagnosis not present

## 2023-11-11 DIAGNOSIS — I5040 Unspecified combined systolic (congestive) and diastolic (congestive) heart failure: Secondary | ICD-10-CM | POA: Diagnosis not present

## 2023-11-11 DIAGNOSIS — E0840 Diabetes mellitus due to underlying condition with diabetic neuropathy, unspecified: Secondary | ICD-10-CM | POA: Diagnosis not present

## 2023-11-11 DIAGNOSIS — R1310 Dysphagia, unspecified: Secondary | ICD-10-CM | POA: Diagnosis not present

## 2023-11-11 DIAGNOSIS — R600 Localized edema: Secondary | ICD-10-CM | POA: Diagnosis not present

## 2023-11-11 DIAGNOSIS — J449 Chronic obstructive pulmonary disease, unspecified: Secondary | ICD-10-CM | POA: Diagnosis not present

## 2023-11-12 DIAGNOSIS — E0840 Diabetes mellitus due to underlying condition with diabetic neuropathy, unspecified: Secondary | ICD-10-CM | POA: Diagnosis not present

## 2023-11-12 DIAGNOSIS — Z7409 Other reduced mobility: Secondary | ICD-10-CM | POA: Diagnosis not present

## 2023-11-12 DIAGNOSIS — I5040 Unspecified combined systolic (congestive) and diastolic (congestive) heart failure: Secondary | ICD-10-CM | POA: Diagnosis not present

## 2023-11-12 DIAGNOSIS — J449 Chronic obstructive pulmonary disease, unspecified: Secondary | ICD-10-CM | POA: Diagnosis not present

## 2023-11-12 DIAGNOSIS — R1311 Dysphagia, oral phase: Secondary | ICD-10-CM | POA: Diagnosis not present

## 2023-11-12 DIAGNOSIS — M6281 Muscle weakness (generalized): Secondary | ICD-10-CM | POA: Diagnosis not present

## 2023-11-12 DIAGNOSIS — R1310 Dysphagia, unspecified: Secondary | ICD-10-CM | POA: Diagnosis not present

## 2023-11-14 DIAGNOSIS — E0840 Diabetes mellitus due to underlying condition with diabetic neuropathy, unspecified: Secondary | ICD-10-CM | POA: Diagnosis not present

## 2023-11-14 DIAGNOSIS — R1310 Dysphagia, unspecified: Secondary | ICD-10-CM | POA: Diagnosis not present

## 2023-11-14 DIAGNOSIS — J449 Chronic obstructive pulmonary disease, unspecified: Secondary | ICD-10-CM | POA: Diagnosis not present

## 2023-11-14 DIAGNOSIS — R1311 Dysphagia, oral phase: Secondary | ICD-10-CM | POA: Diagnosis not present

## 2023-11-14 DIAGNOSIS — M6281 Muscle weakness (generalized): Secondary | ICD-10-CM | POA: Diagnosis not present

## 2023-11-14 DIAGNOSIS — I5040 Unspecified combined systolic (congestive) and diastolic (congestive) heart failure: Secondary | ICD-10-CM | POA: Diagnosis not present

## 2023-11-14 DIAGNOSIS — Z7409 Other reduced mobility: Secondary | ICD-10-CM | POA: Diagnosis not present

## 2023-11-15 DIAGNOSIS — E1165 Type 2 diabetes mellitus with hyperglycemia: Secondary | ICD-10-CM | POA: Diagnosis not present

## 2023-11-15 DIAGNOSIS — R739 Hyperglycemia, unspecified: Secondary | ICD-10-CM | POA: Diagnosis not present

## 2023-11-17 DIAGNOSIS — I5040 Unspecified combined systolic (congestive) and diastolic (congestive) heart failure: Secondary | ICD-10-CM | POA: Diagnosis not present

## 2023-11-17 DIAGNOSIS — R1311 Dysphagia, oral phase: Secondary | ICD-10-CM | POA: Diagnosis not present

## 2023-11-17 DIAGNOSIS — M6281 Muscle weakness (generalized): Secondary | ICD-10-CM | POA: Diagnosis not present

## 2023-11-17 DIAGNOSIS — Z7409 Other reduced mobility: Secondary | ICD-10-CM | POA: Diagnosis not present

## 2023-11-17 DIAGNOSIS — J449 Chronic obstructive pulmonary disease, unspecified: Secondary | ICD-10-CM | POA: Diagnosis not present

## 2023-11-17 DIAGNOSIS — R1310 Dysphagia, unspecified: Secondary | ICD-10-CM | POA: Diagnosis not present

## 2023-11-17 DIAGNOSIS — E0840 Diabetes mellitus due to underlying condition with diabetic neuropathy, unspecified: Secondary | ICD-10-CM | POA: Diagnosis not present

## 2023-11-19 DIAGNOSIS — J449 Chronic obstructive pulmonary disease, unspecified: Secondary | ICD-10-CM | POA: Diagnosis not present

## 2023-11-19 DIAGNOSIS — R1311 Dysphagia, oral phase: Secondary | ICD-10-CM | POA: Diagnosis not present

## 2023-11-19 DIAGNOSIS — M6281 Muscle weakness (generalized): Secondary | ICD-10-CM | POA: Diagnosis not present

## 2023-11-19 DIAGNOSIS — E0840 Diabetes mellitus due to underlying condition with diabetic neuropathy, unspecified: Secondary | ICD-10-CM | POA: Diagnosis not present

## 2023-11-19 DIAGNOSIS — Z7409 Other reduced mobility: Secondary | ICD-10-CM | POA: Diagnosis not present

## 2023-11-19 DIAGNOSIS — R1310 Dysphagia, unspecified: Secondary | ICD-10-CM | POA: Diagnosis not present

## 2023-11-19 DIAGNOSIS — I5040 Unspecified combined systolic (congestive) and diastolic (congestive) heart failure: Secondary | ICD-10-CM | POA: Diagnosis not present

## 2023-11-22 DIAGNOSIS — M6281 Muscle weakness (generalized): Secondary | ICD-10-CM | POA: Diagnosis not present

## 2023-11-22 DIAGNOSIS — E0840 Diabetes mellitus due to underlying condition with diabetic neuropathy, unspecified: Secondary | ICD-10-CM | POA: Diagnosis not present

## 2023-11-22 DIAGNOSIS — Z7409 Other reduced mobility: Secondary | ICD-10-CM | POA: Diagnosis not present

## 2023-11-22 DIAGNOSIS — R1310 Dysphagia, unspecified: Secondary | ICD-10-CM | POA: Diagnosis not present

## 2023-11-22 DIAGNOSIS — I5040 Unspecified combined systolic (congestive) and diastolic (congestive) heart failure: Secondary | ICD-10-CM | POA: Diagnosis not present

## 2023-11-22 DIAGNOSIS — R1311 Dysphagia, oral phase: Secondary | ICD-10-CM | POA: Diagnosis not present

## 2023-11-22 DIAGNOSIS — J449 Chronic obstructive pulmonary disease, unspecified: Secondary | ICD-10-CM | POA: Diagnosis not present

## 2023-11-24 DIAGNOSIS — R1311 Dysphagia, oral phase: Secondary | ICD-10-CM | POA: Diagnosis not present

## 2023-11-24 DIAGNOSIS — E0840 Diabetes mellitus due to underlying condition with diabetic neuropathy, unspecified: Secondary | ICD-10-CM | POA: Diagnosis not present

## 2023-11-24 DIAGNOSIS — I5040 Unspecified combined systolic (congestive) and diastolic (congestive) heart failure: Secondary | ICD-10-CM | POA: Diagnosis not present

## 2023-11-24 DIAGNOSIS — M6281 Muscle weakness (generalized): Secondary | ICD-10-CM | POA: Diagnosis not present

## 2023-11-24 DIAGNOSIS — Z7409 Other reduced mobility: Secondary | ICD-10-CM | POA: Diagnosis not present

## 2023-11-24 DIAGNOSIS — J449 Chronic obstructive pulmonary disease, unspecified: Secondary | ICD-10-CM | POA: Diagnosis not present

## 2023-11-24 DIAGNOSIS — R1310 Dysphagia, unspecified: Secondary | ICD-10-CM | POA: Diagnosis not present

## 2023-11-26 DIAGNOSIS — I5040 Unspecified combined systolic (congestive) and diastolic (congestive) heart failure: Secondary | ICD-10-CM | POA: Diagnosis not present

## 2023-11-26 DIAGNOSIS — J449 Chronic obstructive pulmonary disease, unspecified: Secondary | ICD-10-CM | POA: Diagnosis not present

## 2023-11-26 DIAGNOSIS — M6281 Muscle weakness (generalized): Secondary | ICD-10-CM | POA: Diagnosis not present

## 2023-11-26 DIAGNOSIS — R1310 Dysphagia, unspecified: Secondary | ICD-10-CM | POA: Diagnosis not present

## 2023-11-26 DIAGNOSIS — Z7409 Other reduced mobility: Secondary | ICD-10-CM | POA: Diagnosis not present

## 2023-11-26 DIAGNOSIS — R1311 Dysphagia, oral phase: Secondary | ICD-10-CM | POA: Diagnosis not present

## 2023-11-26 DIAGNOSIS — E0840 Diabetes mellitus due to underlying condition with diabetic neuropathy, unspecified: Secondary | ICD-10-CM | POA: Diagnosis not present

## 2023-11-27 DIAGNOSIS — Z7409 Other reduced mobility: Secondary | ICD-10-CM | POA: Diagnosis not present

## 2023-11-27 DIAGNOSIS — R1311 Dysphagia, oral phase: Secondary | ICD-10-CM | POA: Diagnosis not present

## 2023-11-27 DIAGNOSIS — I5040 Unspecified combined systolic (congestive) and diastolic (congestive) heart failure: Secondary | ICD-10-CM | POA: Diagnosis not present

## 2023-11-27 DIAGNOSIS — R1310 Dysphagia, unspecified: Secondary | ICD-10-CM | POA: Diagnosis not present

## 2023-11-27 DIAGNOSIS — E0840 Diabetes mellitus due to underlying condition with diabetic neuropathy, unspecified: Secondary | ICD-10-CM | POA: Diagnosis not present

## 2023-11-27 DIAGNOSIS — M6281 Muscle weakness (generalized): Secondary | ICD-10-CM | POA: Diagnosis not present

## 2023-11-27 DIAGNOSIS — J449 Chronic obstructive pulmonary disease, unspecified: Secondary | ICD-10-CM | POA: Diagnosis not present

## 2023-12-01 DIAGNOSIS — E1165 Type 2 diabetes mellitus with hyperglycemia: Secondary | ICD-10-CM | POA: Diagnosis not present

## 2023-12-01 DIAGNOSIS — E785 Hyperlipidemia, unspecified: Secondary | ICD-10-CM | POA: Diagnosis not present

## 2023-12-01 DIAGNOSIS — E559 Vitamin D deficiency, unspecified: Secondary | ICD-10-CM | POA: Diagnosis not present

## 2023-12-01 DIAGNOSIS — E1100 Type 2 diabetes mellitus with hyperosmolarity without nonketotic hyperglycemic-hyperosmolar coma (NKHHC): Secondary | ICD-10-CM | POA: Diagnosis not present

## 2023-12-03 DIAGNOSIS — M6281 Muscle weakness (generalized): Secondary | ICD-10-CM | POA: Diagnosis not present

## 2023-12-03 DIAGNOSIS — J449 Chronic obstructive pulmonary disease, unspecified: Secondary | ICD-10-CM | POA: Diagnosis not present

## 2023-12-03 DIAGNOSIS — E0840 Diabetes mellitus due to underlying condition with diabetic neuropathy, unspecified: Secondary | ICD-10-CM | POA: Diagnosis not present

## 2023-12-03 DIAGNOSIS — Z7409 Other reduced mobility: Secondary | ICD-10-CM | POA: Diagnosis not present

## 2023-12-03 DIAGNOSIS — R1311 Dysphagia, oral phase: Secondary | ICD-10-CM | POA: Diagnosis not present

## 2023-12-03 DIAGNOSIS — R1310 Dysphagia, unspecified: Secondary | ICD-10-CM | POA: Diagnosis not present

## 2023-12-03 DIAGNOSIS — I5040 Unspecified combined systolic (congestive) and diastolic (congestive) heart failure: Secondary | ICD-10-CM | POA: Diagnosis not present

## 2023-12-05 DIAGNOSIS — Z7409 Other reduced mobility: Secondary | ICD-10-CM | POA: Diagnosis not present

## 2023-12-05 DIAGNOSIS — E0840 Diabetes mellitus due to underlying condition with diabetic neuropathy, unspecified: Secondary | ICD-10-CM | POA: Diagnosis not present

## 2023-12-05 DIAGNOSIS — J449 Chronic obstructive pulmonary disease, unspecified: Secondary | ICD-10-CM | POA: Diagnosis not present

## 2023-12-05 DIAGNOSIS — R1310 Dysphagia, unspecified: Secondary | ICD-10-CM | POA: Diagnosis not present

## 2023-12-05 DIAGNOSIS — M6281 Muscle weakness (generalized): Secondary | ICD-10-CM | POA: Diagnosis not present

## 2023-12-05 DIAGNOSIS — I5040 Unspecified combined systolic (congestive) and diastolic (congestive) heart failure: Secondary | ICD-10-CM | POA: Diagnosis not present

## 2023-12-05 DIAGNOSIS — R1311 Dysphagia, oral phase: Secondary | ICD-10-CM | POA: Diagnosis not present

## 2023-12-08 DIAGNOSIS — F419 Anxiety disorder, unspecified: Secondary | ICD-10-CM | POA: Diagnosis not present

## 2023-12-08 DIAGNOSIS — F329 Major depressive disorder, single episode, unspecified: Secondary | ICD-10-CM | POA: Diagnosis not present

## 2023-12-08 DIAGNOSIS — Z720 Tobacco use: Secondary | ICD-10-CM | POA: Diagnosis not present

## 2023-12-08 DIAGNOSIS — G4700 Insomnia, unspecified: Secondary | ICD-10-CM | POA: Diagnosis not present

## 2023-12-09 DIAGNOSIS — I10 Essential (primary) hypertension: Secondary | ICD-10-CM | POA: Diagnosis not present

## 2023-12-09 DIAGNOSIS — E1165 Type 2 diabetes mellitus with hyperglycemia: Secondary | ICD-10-CM | POA: Diagnosis not present

## 2023-12-09 DIAGNOSIS — Z794 Long term (current) use of insulin: Secondary | ICD-10-CM | POA: Diagnosis not present

## 2024-01-28 ENCOUNTER — Other Ambulatory Visit: Payer: Self-pay

## 2024-01-28 ENCOUNTER — Encounter (HOSPITAL_COMMUNITY): Payer: Self-pay | Admitting: Emergency Medicine

## 2024-01-28 ENCOUNTER — Observation Stay (HOSPITAL_COMMUNITY)
Admission: EM | Admit: 2024-01-28 | Discharge: 2024-01-31 | Disposition: A | Discharge status: Skilled Nursing Facility | Attending: Emergency Medicine | Admitting: Emergency Medicine

## 2024-01-28 ENCOUNTER — Emergency Department (HOSPITAL_COMMUNITY)

## 2024-01-28 DIAGNOSIS — E785 Hyperlipidemia, unspecified: Secondary | ICD-10-CM | POA: Diagnosis not present

## 2024-01-28 DIAGNOSIS — F419 Anxiety disorder, unspecified: Secondary | ICD-10-CM | POA: Diagnosis not present

## 2024-01-28 DIAGNOSIS — G894 Chronic pain syndrome: Secondary | ICD-10-CM | POA: Diagnosis present

## 2024-01-28 DIAGNOSIS — E162 Hypoglycemia, unspecified: Secondary | ICD-10-CM | POA: Diagnosis present

## 2024-01-28 DIAGNOSIS — Z79899 Other long term (current) drug therapy: Secondary | ICD-10-CM | POA: Diagnosis not present

## 2024-01-28 DIAGNOSIS — K861 Other chronic pancreatitis: Secondary | ICD-10-CM | POA: Diagnosis not present

## 2024-01-28 DIAGNOSIS — Z794 Long term (current) use of insulin: Secondary | ICD-10-CM | POA: Insufficient documentation

## 2024-01-28 DIAGNOSIS — J449 Chronic obstructive pulmonary disease, unspecified: Secondary | ICD-10-CM | POA: Insufficient documentation

## 2024-01-28 DIAGNOSIS — I5022 Chronic systolic (congestive) heart failure: Secondary | ICD-10-CM | POA: Diagnosis present

## 2024-01-28 DIAGNOSIS — J9611 Chronic respiratory failure with hypoxia: Secondary | ICD-10-CM | POA: Diagnosis present

## 2024-01-28 DIAGNOSIS — I130 Hypertensive heart and chronic kidney disease with heart failure and stage 1 through stage 4 chronic kidney disease, or unspecified chronic kidney disease: Secondary | ICD-10-CM | POA: Insufficient documentation

## 2024-01-28 DIAGNOSIS — G4733 Obstructive sleep apnea (adult) (pediatric): Secondary | ICD-10-CM | POA: Insufficient documentation

## 2024-01-28 DIAGNOSIS — Z9981 Dependence on supplemental oxygen: Secondary | ICD-10-CM | POA: Diagnosis not present

## 2024-01-28 DIAGNOSIS — E876 Hypokalemia: Secondary | ICD-10-CM | POA: Diagnosis not present

## 2024-01-28 DIAGNOSIS — E1142 Type 2 diabetes mellitus with diabetic polyneuropathy: Secondary | ICD-10-CM | POA: Diagnosis not present

## 2024-01-28 DIAGNOSIS — K439 Ventral hernia without obstruction or gangrene: Secondary | ICD-10-CM | POA: Insufficient documentation

## 2024-01-28 DIAGNOSIS — Z6841 Body Mass Index (BMI) 40.0 and over, adult: Secondary | ICD-10-CM | POA: Insufficient documentation

## 2024-01-28 DIAGNOSIS — L0889 Other specified local infections of the skin and subcutaneous tissue: Secondary | ICD-10-CM | POA: Insufficient documentation

## 2024-01-28 DIAGNOSIS — L8990 Pressure ulcer of unspecified site, unspecified stage: Secondary | ICD-10-CM | POA: Diagnosis not present

## 2024-01-28 DIAGNOSIS — L089 Local infection of the skin and subcutaneous tissue, unspecified: Secondary | ICD-10-CM | POA: Diagnosis present

## 2024-01-28 DIAGNOSIS — R109 Unspecified abdominal pain: Secondary | ICD-10-CM | POA: Insufficient documentation

## 2024-01-28 DIAGNOSIS — N183 Chronic kidney disease, stage 3 unspecified: Secondary | ICD-10-CM | POA: Insufficient documentation

## 2024-01-28 DIAGNOSIS — E1122 Type 2 diabetes mellitus with diabetic chronic kidney disease: Secondary | ICD-10-CM | POA: Diagnosis not present

## 2024-01-28 DIAGNOSIS — E11649 Type 2 diabetes mellitus with hypoglycemia without coma: Principal | ICD-10-CM | POA: Insufficient documentation

## 2024-01-28 DIAGNOSIS — Z7982 Long term (current) use of aspirin: Secondary | ICD-10-CM | POA: Diagnosis not present

## 2024-01-28 DIAGNOSIS — I10 Essential (primary) hypertension: Secondary | ICD-10-CM | POA: Diagnosis present

## 2024-01-28 DIAGNOSIS — F3160 Bipolar disorder, current episode mixed, unspecified: Secondary | ICD-10-CM | POA: Diagnosis not present

## 2024-01-28 LAB — CBG MONITORING, ED: Glucose-Capillary: 91 mg/dL (ref 70–99)

## 2024-01-28 NOTE — ED Triage Notes (Signed)
" °  Patient comes in after hypoglycemic event at nursing facility.  Staff went to check on patient and cbg was 41, was given juice and came up to 60.  EMS was called and given glucose gel.  CBG 91 at this moment.  Abdominal pain from existing hernia.   "

## 2024-01-29 ENCOUNTER — Encounter (HOSPITAL_COMMUNITY): Payer: Self-pay | Admitting: Internal Medicine

## 2024-01-29 DIAGNOSIS — J449 Chronic obstructive pulmonary disease, unspecified: Secondary | ICD-10-CM | POA: Insufficient documentation

## 2024-01-29 DIAGNOSIS — E162 Hypoglycemia, unspecified: Secondary | ICD-10-CM | POA: Diagnosis present

## 2024-01-29 DIAGNOSIS — E785 Hyperlipidemia, unspecified: Secondary | ICD-10-CM | POA: Insufficient documentation

## 2024-01-29 DIAGNOSIS — E876 Hypokalemia: Secondary | ICD-10-CM

## 2024-01-29 DIAGNOSIS — R109 Unspecified abdominal pain: Secondary | ICD-10-CM | POA: Insufficient documentation

## 2024-01-29 DIAGNOSIS — N1831 Chronic kidney disease, stage 3a: Secondary | ICD-10-CM

## 2024-01-29 DIAGNOSIS — N183 Chronic kidney disease, stage 3 unspecified: Secondary | ICD-10-CM | POA: Insufficient documentation

## 2024-01-29 LAB — CBC WITH DIFFERENTIAL/PLATELET
Abs Immature Granulocytes: 0.04 10*3/uL (ref 0.00–0.07)
Basophils Absolute: 0 10*3/uL (ref 0.0–0.1)
Basophils Relative: 1 %
Eosinophils Absolute: 0.1 10*3/uL (ref 0.0–0.5)
Eosinophils Relative: 2 %
HCT: 35.8 % — ABNORMAL LOW (ref 36.0–46.0)
Hemoglobin: 11.5 g/dL — ABNORMAL LOW (ref 12.0–15.0)
Immature Granulocytes: 1 %
Lymphocytes Relative: 18 %
Lymphs Abs: 1.3 10*3/uL (ref 0.7–4.0)
MCH: 27 pg (ref 26.0–34.0)
MCHC: 32.1 g/dL (ref 30.0–36.0)
MCV: 84 fL (ref 80.0–100.0)
Monocytes Absolute: 0.6 10*3/uL (ref 0.1–1.0)
Monocytes Relative: 9 %
Neutro Abs: 4.9 10*3/uL (ref 1.7–7.7)
Neutrophils Relative %: 69 %
Platelets: 204 10*3/uL (ref 150–400)
RBC: 4.26 MIL/uL (ref 3.87–5.11)
RDW: 15.1 % (ref 11.5–15.5)
WBC: 7 10*3/uL (ref 4.0–10.5)
nRBC: 0 % (ref 0.0–0.2)

## 2024-01-29 LAB — CBC
HCT: 35.2 % — ABNORMAL LOW (ref 36.0–46.0)
Hemoglobin: 11.3 g/dL — ABNORMAL LOW (ref 12.0–15.0)
MCH: 27.2 pg (ref 26.0–34.0)
MCHC: 32.1 g/dL (ref 30.0–36.0)
MCV: 84.8 fL (ref 80.0–100.0)
Platelets: 186 10*3/uL (ref 150–400)
RBC: 4.15 MIL/uL (ref 3.87–5.11)
RDW: 15.1 % (ref 11.5–15.5)
WBC: 5.7 10*3/uL (ref 4.0–10.5)
nRBC: 0 % (ref 0.0–0.2)

## 2024-01-29 LAB — URINALYSIS, ROUTINE W REFLEX MICROSCOPIC
Bilirubin Urine: NEGATIVE
Glucose, UA: 50 mg/dL — AB
Ketones, ur: NEGATIVE mg/dL
Nitrite: NEGATIVE
Protein, ur: NEGATIVE mg/dL
Specific Gravity, Urine: 1.006 (ref 1.005–1.030)
WBC, UA: >50 WBC/hpf (ref 0–5)
pH: 6 (ref 5.0–8.0)

## 2024-01-29 LAB — BASIC METABOLIC PANEL WITH GFR
Anion gap: 11 (ref 5–15)
BUN: 13 mg/dL (ref 8–23)
CO2: 27 mmol/L (ref 22–32)
Calcium: 8.9 mg/dL (ref 8.9–10.3)
Chloride: 99 mmol/L (ref 98–111)
Creatinine, Ser: 1.02 mg/dL — ABNORMAL HIGH (ref 0.44–1.00)
GFR, Estimated: >60 mL/min
Glucose, Bld: 201 mg/dL — ABNORMAL HIGH (ref 70–99)
Potassium: 3.6 mmol/L (ref 3.5–5.1)
Sodium: 137 mmol/L (ref 135–145)

## 2024-01-29 LAB — BETA-HYDROXYBUTYRIC ACID: Beta-Hydroxybutyric Acid: 1.33 mmol/L — ABNORMAL HIGH (ref 0.05–0.27)

## 2024-01-29 LAB — COMPREHENSIVE METABOLIC PANEL WITH GFR
ALT: 8 U/L (ref 0–44)
AST: 14 U/L — ABNORMAL LOW (ref 15–41)
Albumin: 3.7 g/dL (ref 3.5–5.0)
Alkaline Phosphatase: 143 U/L — ABNORMAL HIGH (ref 38–126)
Anion gap: 10 (ref 5–15)
BUN: 16 mg/dL (ref 8–23)
CO2: 29 mmol/L (ref 22–32)
Calcium: 9.4 mg/dL (ref 8.9–10.3)
Chloride: 101 mmol/L (ref 98–111)
Creatinine, Ser: 1.12 mg/dL — ABNORMAL HIGH (ref 0.44–1.00)
GFR, Estimated: 54 mL/min — ABNORMAL LOW
Glucose, Bld: 64 mg/dL — ABNORMAL LOW (ref 70–99)
Potassium: 3 mmol/L — ABNORMAL LOW (ref 3.5–5.1)
Sodium: 140 mmol/L (ref 135–145)
Total Bilirubin: 0.3 mg/dL (ref 0.0–1.2)
Total Protein: 6.8 g/dL (ref 6.5–8.1)

## 2024-01-29 LAB — GLUCOSE, CAPILLARY
Glucose-Capillary: 210 mg/dL — ABNORMAL HIGH (ref 70–99)
Glucose-Capillary: 249 mg/dL — ABNORMAL HIGH (ref 70–99)
Glucose-Capillary: 258 mg/dL — ABNORMAL HIGH (ref 70–99)
Glucose-Capillary: 294 mg/dL — ABNORMAL HIGH (ref 70–99)
Glucose-Capillary: 386 mg/dL — ABNORMAL HIGH (ref 70–99)
Glucose-Capillary: 305 mg/dL — ABNORMAL HIGH (ref 70–99)

## 2024-01-29 LAB — HEMOGLOBIN A1C
Hgb A1c MFr Bld: 8.7 % — ABNORMAL HIGH (ref 4.8–5.6)
Mean Plasma Glucose: 202.99 mg/dL

## 2024-01-29 LAB — HEPATIC FUNCTION PANEL
ALT: 6 U/L (ref 0–44)
AST: 14 U/L — ABNORMAL LOW (ref 15–41)
Albumin: 3.4 g/dL — ABNORMAL LOW (ref 3.5–5.0)
Alkaline Phosphatase: 140 U/L — ABNORMAL HIGH (ref 38–126)
Bilirubin, Direct: 0.2 mg/dL (ref 0.0–0.2)
Indirect Bilirubin: 0.3 mg/dL (ref 0.3–0.9)
Total Bilirubin: 0.5 mg/dL (ref 0.0–1.2)
Total Protein: 6.3 g/dL — ABNORMAL LOW (ref 6.5–8.1)

## 2024-01-29 LAB — CBG MONITORING, ED
Glucose-Capillary: 116 mg/dL — ABNORMAL HIGH (ref 70–99)
Glucose-Capillary: 65 mg/dL — ABNORMAL LOW (ref 70–99)

## 2024-01-29 LAB — CORTISOL: Cortisol, Plasma: 7.2 ug/dL

## 2024-01-29 MED ORDER — POLYETHYLENE GLYCOL 3350 17 G PO PACK
17.0000 g | PACK | Freq: Two times a day (BID) | ORAL | Status: DC
  Administered 2024-01-29 – 2024-01-30 (×3): 17 g via ORAL
  Filled 2024-01-29 (×4): qty 1

## 2024-01-29 MED ORDER — MAGNESIUM OXIDE -MG SUPPLEMENT 400 (240 MG) MG PO TABS
400.0000 mg | ORAL_TABLET | Freq: Every day | ORAL | Status: DC
  Administered 2024-01-29 – 2024-01-31 (×3): 400 mg via ORAL
  Filled 2024-01-29 (×3): qty 1

## 2024-01-29 MED ORDER — ATORVASTATIN CALCIUM 40 MG PO TABS
40.0000 mg | ORAL_TABLET | Freq: Every day | ORAL | Status: DC
  Administered 2024-01-29 – 2024-01-30 (×2): 40 mg via ORAL
  Filled 2024-01-29 (×2): qty 1

## 2024-01-29 MED ORDER — POTASSIUM CHLORIDE 10 MEQ/100ML IV SOLN
10.0000 meq | INTRAVENOUS | Status: AC
  Administered 2024-01-29 (×3): 10 meq via INTRAVENOUS
  Filled 2024-01-29 (×3): qty 100

## 2024-01-29 MED ORDER — FLUTICASONE FUROATE-VILANTEROL 200-25 MCG/ACT IN AEPB
1.0000 | INHALATION_SPRAY | Freq: Every day | RESPIRATORY_TRACT | Status: DC
  Administered 2024-01-29 – 2024-01-30 (×2): 1 via RESPIRATORY_TRACT
  Filled 2024-01-29: qty 28

## 2024-01-29 MED ORDER — QUETIAPINE FUMARATE 200 MG PO TABS
200.0000 mg | ORAL_TABLET | Freq: Every day | ORAL | Status: DC
  Administered 2024-01-29 – 2024-01-31 (×3): 200 mg via ORAL
  Filled 2024-01-29 (×3): qty 1

## 2024-01-29 MED ORDER — SENNA 8.6 MG PO TABS
2.0000 | ORAL_TABLET | Freq: Every day | ORAL | Status: DC
  Administered 2024-01-29 – 2024-01-31 (×3): 17.2 mg via ORAL
  Filled 2024-01-29 (×3): qty 2

## 2024-01-29 MED ORDER — GABAPENTIN 300 MG PO CAPS
600.0000 mg | ORAL_CAPSULE | Freq: Three times a day (TID) | ORAL | Status: DC
  Administered 2024-01-29 – 2024-01-31 (×8): 600 mg via ORAL
  Filled 2024-01-29 (×8): qty 2

## 2024-01-29 MED ORDER — ASPIRIN 81 MG PO TBEC
81.0000 mg | DELAYED_RELEASE_TABLET | Freq: Every day | ORAL | Status: DC
  Administered 2024-01-29 – 2024-01-31 (×3): 81 mg via ORAL
  Filled 2024-01-29 (×3): qty 1

## 2024-01-29 MED ORDER — ALPRAZOLAM 0.5 MG PO TABS
0.5000 mg | ORAL_TABLET | Freq: Every day | ORAL | Status: DC
  Administered 2024-01-29 – 2024-01-31 (×3): 0.5 mg via ORAL
  Filled 2024-01-29 (×3): qty 1

## 2024-01-29 MED ORDER — HYDRALAZINE HCL 10 MG PO TABS
10.0000 mg | ORAL_TABLET | Freq: Four times a day (QID) | ORAL | Status: DC
  Administered 2024-01-29 – 2024-01-31 (×9): 10 mg via ORAL
  Filled 2024-01-29 (×10): qty 1

## 2024-01-29 MED ORDER — DILTIAZEM HCL ER COATED BEADS 240 MG PO CP24
240.0000 mg | ORAL_CAPSULE | Freq: Every day | ORAL | Status: DC
  Administered 2024-01-29 – 2024-01-31 (×3): 240 mg via ORAL
  Filled 2024-01-29 (×3): qty 1

## 2024-01-29 MED ORDER — DICLOFENAC SODIUM 1 % EX GEL
2.0000 g | Freq: Four times a day (QID) | CUTANEOUS | Status: DC
  Administered 2024-01-29 – 2024-01-31 (×8): 2 g via TOPICAL
  Filled 2024-01-29: qty 100

## 2024-01-29 MED ORDER — VITAMIN C 500 MG PO TABS
1000.0000 mg | ORAL_TABLET | Freq: Every day | ORAL | Status: DC
  Administered 2024-01-29 – 2024-01-31 (×3): 1000 mg via ORAL
  Filled 2024-01-29 (×3): qty 2

## 2024-01-29 MED ORDER — TRAMADOL HCL 50 MG PO TABS
50.0000 mg | ORAL_TABLET | Freq: Every day | ORAL | Status: DC
  Administered 2024-01-29 – 2024-01-30 (×2): 50 mg via ORAL
  Filled 2024-01-29 (×2): qty 1

## 2024-01-29 MED ORDER — LIDOCAINE 5 % EX PTCH
1.0000 | MEDICATED_PATCH | Freq: Two times a day (BID) | CUTANEOUS | Status: DC
  Administered 2024-01-29 – 2024-01-31 (×5): 1 via TRANSDERMAL
  Filled 2024-01-29 (×5): qty 1

## 2024-01-29 MED ORDER — TORSEMIDE 20 MG PO TABS
20.0000 mg | ORAL_TABLET | Freq: Every day | ORAL | Status: DC
  Administered 2024-01-29 – 2024-01-31 (×3): 20 mg via ORAL
  Filled 2024-01-29 (×3): qty 1

## 2024-01-29 MED ORDER — MAGNESIUM SULFATE 2 GM/50ML IV SOLN
2.0000 g | Freq: Once | INTRAVENOUS | Status: AC
  Administered 2024-01-29: 2 g via INTRAVENOUS
  Filled 2024-01-29: qty 50

## 2024-01-29 MED ORDER — ARIPIPRAZOLE 2 MG PO TABS
2.0000 mg | ORAL_TABLET | Freq: Every day | ORAL | Status: DC
  Administered 2024-01-29 – 2024-01-31 (×3): 2 mg via ORAL
  Filled 2024-01-29 (×3): qty 1

## 2024-01-29 MED ORDER — ENOXAPARIN SODIUM 40 MG/0.4ML IJ SOSY
40.0000 mg | PREFILLED_SYRINGE | INTRAMUSCULAR | Status: DC
  Administered 2024-01-29 – 2024-01-31 (×3): 40 mg via SUBCUTANEOUS
  Filled 2024-01-29 (×3): qty 0.4

## 2024-01-29 MED ORDER — TRAMADOL HCL 50 MG PO TABS
50.0000 mg | ORAL_TABLET | Freq: Once | ORAL | Status: AC
  Administered 2024-01-29: 50 mg via ORAL
  Filled 2024-01-29: qty 1

## 2024-01-29 MED ORDER — ALBUTEROL SULFATE (2.5 MG/3ML) 0.083% IN NEBU: 3.0000 mL | INHALATION_SOLUTION | Freq: Four times a day (QID) | RESPIRATORY_TRACT | Status: DC | PRN

## 2024-01-29 MED ORDER — TIZANIDINE HCL 4 MG PO TABS
2.0000 mg | ORAL_TABLET | Freq: Three times a day (TID) | ORAL | Status: DC
  Administered 2024-01-29 – 2024-01-31 (×7): 2 mg via ORAL
  Filled 2024-01-29 (×7): qty 1

## 2024-01-29 MED ORDER — DEXTROSE 5 % IV SOLN: Freq: Once | INTRAVENOUS | Status: DC

## 2024-01-29 MED ORDER — LINACLOTIDE 145 MCG PO CAPS
290.0000 ug | ORAL_CAPSULE | Freq: Every day | ORAL | Status: DC
  Administered 2024-01-29 – 2024-01-31 (×3): 290 ug via ORAL
  Filled 2024-01-29 (×3): qty 2

## 2024-01-29 MED ORDER — ARIPIPRAZOLE 5 MG PO TABS
5.0000 mg | ORAL_TABLET | Freq: Every day | ORAL | Status: DC
  Administered 2024-01-29 – 2024-01-31 (×3): 5 mg via ORAL
  Filled 2024-01-29 (×3): qty 1

## 2024-01-29 MED ORDER — QUETIAPINE FUMARATE 200 MG PO TABS
500.0000 mg | ORAL_TABLET | Freq: Every day | ORAL | Status: DC
  Administered 2024-01-29 – 2024-01-30 (×2): 500 mg via ORAL
  Filled 2024-01-29 (×2): qty 1

## 2024-01-29 MED ORDER — SERTRALINE HCL 100 MG PO TABS
200.0000 mg | ORAL_TABLET | Freq: Every day | ORAL | Status: DC
  Administered 2024-01-29 – 2024-01-31 (×3): 200 mg via ORAL
  Filled 2024-01-29 (×3): qty 2

## 2024-01-29 MED ORDER — CHLORHEXIDINE GLUCONATE CLOTH 2 % EX PADS
6.0000 | MEDICATED_PAD | Freq: Every day | CUTANEOUS | Status: DC
  Administered 2024-01-29 – 2024-01-30 (×2): 6 via TOPICAL

## 2024-01-29 MED ORDER — SPIRONOLACTONE 25 MG PO TABS
25.0000 mg | ORAL_TABLET | Freq: Every day | ORAL | Status: DC
  Administered 2024-01-29 – 2024-01-31 (×3): 25 mg via ORAL
  Filled 2024-01-29 (×3): qty 1

## 2024-01-29 MED ORDER — BISACODYL 10 MG RE SUPP
10.0000 mg | Freq: Once | RECTAL | Status: AC
  Administered 2024-01-29: 10 mg via RECTAL
  Filled 2024-01-29: qty 1

## 2024-01-29 MED ORDER — KETOROLAC TROMETHAMINE 30 MG/ML IJ SOLN
15.0000 mg | Freq: Once | INTRAMUSCULAR | Status: AC
  Administered 2024-01-29: 15 mg via INTRAVENOUS
  Filled 2024-01-29: qty 1

## 2024-01-29 MED ORDER — DEXTROSE-SODIUM CHLORIDE 5-0.9 % IV SOLN: INTRAVENOUS | Status: DC

## 2024-01-29 MED ORDER — PRIMIDONE 50 MG PO TABS
50.0000 mg | ORAL_TABLET | Freq: Every day | ORAL | Status: DC
  Administered 2024-01-29 – 2024-01-31 (×3): 50 mg via ORAL
  Filled 2024-01-29 (×3): qty 1

## 2024-01-29 MED ORDER — INSULIN ASPART 100 UNIT/ML IJ SOLN
0.0000 [IU] | Freq: Three times a day (TID) | INTRAMUSCULAR | Status: DC
  Administered 2024-01-29: 9 [IU] via SUBCUTANEOUS
  Administered 2024-01-30 (×2): 5 [IU] via SUBCUTANEOUS
  Administered 2024-01-30 – 2024-01-31 (×2): 9 [IU] via SUBCUTANEOUS
  Administered 2024-01-31: 5 [IU] via SUBCUTANEOUS
  Filled 2024-01-29: qty 9
  Filled 2024-01-29 (×2): qty 5
  Filled 2024-01-29: qty 9
  Filled 2024-01-29: qty 5
  Filled 2024-01-29: qty 9

## 2024-01-29 MED ORDER — DOXEPIN HCL 10 MG PO CAPS
10.0000 mg | ORAL_CAPSULE | Freq: Every day | ORAL | Status: DC
  Administered 2024-01-29 – 2024-01-30 (×2): 10 mg via ORAL
  Filled 2024-01-29 (×2): qty 1

## 2024-01-29 MED ORDER — VITAMIN D 25 MCG (1000 UNIT) PO TABS
2000.0000 [IU] | ORAL_TABLET | Freq: Every day | ORAL | Status: DC
  Administered 2024-01-29 – 2024-01-31 (×3): 2000 [IU] via ORAL
  Filled 2024-01-29 (×3): qty 2

## 2024-01-29 NOTE — Consult Note (Signed)
 WOC Nurse Consult Note: Reason for Consult:Asked to consult on nonhealing wound to right leg.  Lymphedema, but has not been back to [facility] to retrieve removable compression garment.  WIll implement Unna boots and absorbent dressing to nonintact wound.  I have asked bedside RN to upload an image of wound and add assessment and  measurements to flowsheet per admission wound assessment protocol Wound type: Inflammatory in the setting of lymphedema.  Pressure Injury POA: NA Measurement to be added to flowsheet Periwound: edema to lower legs Dressing procedure/placement/frequency: Cleanse bilateral lower leg wound with NS and pat dry. Apply aquacel to open wound on right leg.  Unna boot to bilateral lower legs. Change twice weekly.  Will not follow at this time.  Please re-consult if needed.  Darice Cooley MSN, RN, FNP-BC CWON Wound, Ostomy, Continence Nurse Outpatient [facility] [phone] Work cell phone:  [phone]

## 2024-01-29 NOTE — Plan of Care (Signed)

## 2024-01-29 NOTE — Progress Notes (Signed)
 Subjective: Patient admitted this morning, see detailed H&P by Dr Franky 69 y.o. female with history of diabetes mellitus type 2, chronic respiratory failure with hypoxia on 2 L oxygen  secondary to COPD and sleep apnea, hypertension, hyperlipidemia, bipolar disorder, morbid obesity, chronic pain, IBS and chronic ventral hernia was brought to the ER after patient blood sugar was found to be in the 40s.  Patient is a poor historian and states that she has chronic abdominal pain and has been having poor appetite.  CT abdomen pelvis does not show anything acute. Patient's blood sugar was 64 and was given D50 following which it was again 65 was started on D5. Labs otherwise shows hemoglobin of 11.5 potassium 3 creatinine 1.1. Admitted for hypoglycemia.  Vitals:   01/29/24 0445 01/29/24 0523  BP: (!) 153/48 (!) 159/53  Pulse: 73 72  Resp: 14 12  Temp:  98.2 F (36.8 C)  SpO2: 96% 98%      A/P  Hypoglycemia in the setting of known history of diabetes mellitus type 2 last hemoglobin A1c in our system about a year ago was 7.3.  Patient is on Semglee  insulin  20 units along with semaglutide and Jardiance .  Oral hypoglycemic agents were held.  Started on D5 W.  CBG has improved.  Will discontinue D5W.   Recheck hemoglobin A1c, beta-hydroxybutyrate acid, C-peptide cortisol level.  Chronic abdominal pain with ventral hernia CT scan abdomen does not show anything acute.  Has not had BM in 7 days.  Will give 1 dose of Dulcolax suppository 10 mg  Hypertension on Cardizem , spironolactone , hydralazine , torsemide .  Chronic kidney disease stage III creatinine at around baseline.  History of chronic pancreatitis CT scan abdomen was unremarkable  History of bipolar disorder on Abilify , Seroquel , Zoloft  and Xanax .  Medications verified on nursing home [facility].  Chronic pain on gabapentin .  Right lower extremity wound dressing done.  Consult wound team.  Sleep apnea noncompliant with  CPAP.  Hyperlipidemia on statins.  Chronic respiratory failure with hypoxia on 2 L oxygen  secondary COPD on Breo and as needed albuterol .   Sabas GORMAN Brod [facility]

## 2024-01-29 NOTE — Care Management Obs Status (Signed)
 MEDICARE OBSERVATION STATUS NOTIFICATION   Patient Details  Name: ARISA CONGLETON MRN: [id number] Date of Birth: May 21, 1955   Medicare Observation Status Notification Given:  Yes    Sonda Manuella Quill, RN 01/29/2024, 3:08 PM

## 2024-01-29 NOTE — Plan of Care (Signed)
" °  Problem: Nutritional: Goal: Maintenance of adequate nutrition will improve Outcome: Progressing   Problem: Clinical Measurements: Goal: Ability to maintain clinical measurements within normal limits will improve Outcome: Progressing   Problem: Pain Managment: Goal: General experience of comfort will improve and/or be controlled Outcome: Progressing   Problem: Metabolic: Goal: Ability to maintain appropriate glucose levels will improve Outcome: Progressing   "
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
## 2024-01-29 NOTE — Progress Notes (Signed)
 Orthopedic Tech Progress Note Patient Details:  Faith Flores 1955/02/28 [id number]  Ortho Devices Type of Ortho Device: Radio broadcast assistant Ortho Device/Splint Location: Bilateral Ortho Device/Splint Interventions: Adjustment, Application, Ordered   Post Interventions Patient Tolerated: Well Instructions Provided: Care of device, Adjustment of device  [name] 01/29/2024, 1:24 PM

## 2024-01-29 NOTE — ED Provider Notes (Signed)
 " [facility] AT [facility] Provider Note   CSN: [id number] Arrival date & time: 01/28/24  2313     Patient presents with: Hypoglycemia and Abdominal Pain   Faith Flores is a 69 y.o. female.   The history is provided by the patient and the EMS personnel.  Hypoglycemia Initial blood sugar:  41 Blood sugar after intervention:  60 Severity:  Severe Onset quality:  Sudden Timing:  Constant Progression:  Unchanged Chronicity:  New Diabetic status:  Controlled with insulin  and controlled with oral medications (GLP1) Time since last antidiabetic medication: hours. Context: not new diabetes diagnosis   Relieved by:  Nothing Ineffective treatments:  None tried Associated symptoms: no altered mental status   Risk factors: no alcohol abuse   Abdominal Pain Pain location:  L flank Pain quality: cramping   Pain radiates to:  Does not radiate Pain severity:  Severe Chronicity:  New Context: not laxative use   Relieved by:  Nothing Worsened by:  Nothing Ineffective treatments:  None tried Associated symptoms: no anorexia and no fever   Risk factors: no alcohol abuse        Prior to Admission medications  Medication Sig Start Date End Date Taking? Authorizing Provider  acetaminophen  (TYLENOL ) 325 MG tablet Take 650 mg by mouth every 4 (four) hours as needed for mild pain (pain score 1-3).   Yes [provider]  albuterol  (VENTOLIN  HFA) 108 (90 Base) MCG/ACT inhaler Inhale 2 puffs into the lungs every 6 (six) hours as needed for wheezing or shortness of breath. 09/15/22  Yes Gonfa, Taye T, MD  ALPRAZolam  (XANAX ) 0.5 MG tablet Take 0.5 mg by mouth daily. 05/14/19  Yes [provider]  ARIPiprazole  (ABILIFY ) 2 MG tablet Take 2 mg by mouth daily. Give with 5mg  for a total of 7mg    Yes [provider]  ARIPiprazole  (ABILIFY ) 5 MG tablet Take 5 mg by mouth daily. Give with the 2mg  for a total of 7mg    Yes [provider]   ascorbic acid  (VITAMIN C ) 1000 MG tablet Take 1,000 mg by mouth daily.   Yes [provider]  aspirin  EC 81 MG tablet Take 81 mg by mouth daily. Swallow whole.   Yes [provider]  atorvastatin  (LIPITOR) 40 MG tablet Take 40 mg by mouth daily.   Yes [provider]  bisacodyl  (DULCOLAX) 10 MG suppository Place 1 suppository (10 mg total) rectally daily as needed for moderate constipation. 09/15/22  Yes Gonfa, Taye T, MD  Butalbital-APAP-Caffeine 50-300-40 MG CAPS Take 1 capsule by mouth every 6 (six) hours as needed (Migranes). 01/10/24  Yes [provider]  Cholecalciferol  (VITAMIN D3) 50 MCG (2000 UT) TABS Take 2,000 Units by mouth daily.   Yes [provider]  diclofenac  Sodium (VOLTAREN ) 1 % GEL Apply 2 g topically in the morning and at bedtime. Apply to right shoulder   Yes [provider]  diltiazem  (CARDIZEM  CD) 240 MG 24 hr capsule Take 1 capsule (240 mg total) by mouth daily. 09/15/22  Yes Gonfa, Taye T, MD  doxepin  (SINEQUAN ) 10 MG capsule Take 10 mg by mouth at bedtime.   Yes [provider]  empagliflozin  (JARDIANCE ) 25 MG TABS tablet Take 25 mg by mouth in the morning. 11/30/19  Yes [provider]  gabapentin  (NEURONTIN ) 600 MG tablet Take 600 mg by mouth every 8 (eight) hours. 11/11/19  Yes [provider]  Glucagon HCl (GLUCAGEN HYPOKIT) 1 MG SOLR Inject 1  mg into the muscle as needed (Hypoglycemia).   Yes [provider]  Glycerin, Laxative, (FLEET LIQUID GLYCERIN SUPP) 5.4 GM/DOSE ENEM Place 1 enema rectally daily. As needed for constipation   Yes [provider]  hydrALAZINE  (APRESOLINE ) 10 MG tablet Take 10 mg by mouth every 6 (six) hours. 01/19/24  Yes [provider]  insulin  glargine-yfgn (SEMGLEE ) 100 UNIT/ML Pen Inject 20 Units into the skin daily. Patient taking differently: Inject 35 Units into the skin daily. 09/15/22  Yes Gonfa, Taye T, MD  Insulin  Regular Human  (HUMULIN R ) 100 UNIT/ML KwikPen Inject 0-26 Units into the skin See admin instructions. Inject 5 units subcutaneously with meals for Diabetes in addition to SS . Inject as per sliding scale: Below 70 = Call MD BG 0-150 = 0 units BG 151-200 = 6 units BG 201-250 = 10 units BG 251-300 = 14 units BG 301-350 = 18 units BG 351-400 = 22 units BG 401+ = 26 units Hold if BS is less than 200   Yes [provider]  Lidocaine  HCl (LIDAFLEX) 4 % PTCH Apply 1 patch topically every 12 (twelve) hours. Remove after 12hrs Apply 2000, Remove 0800   Yes [provider]  LINZESS  290 MCG CAPS capsule Take 290 mcg by mouth See admin instructions. Give one capsule by mouth one time a day every other day for constipation 10/27/20  Yes [provider]  magnesium  oxide (MAG-OX) 400 MG tablet Take 400 mg by mouth daily.   Yes [provider]  Menthol, Topical Analgesic, (BIOFREEZE COOL THE PAIN) 4 % GEL Apply 1 Application topically every 6 (six) hours as needed (Pain).   Yes [provider]  mometasone -formoterol  (DULERA ) 200-5 MCG/ACT AERO Inhale 2 puffs into the lungs in the morning and at bedtime. 09/15/22  Yes Gonfa, Taye T, MD  nystatin powder Apply 1 Application topically 3 (three) times daily.   Yes [provider]  OXYGEN  Inhale 2 L into the lungs daily.   Yes [provider]  OZEMPIC, 0.25 OR 0.5 MG/DOSE, 2 MG/3ML SOPN  08/04/23  Yes [provider]  polyethylene glycol (MIRALAX  / GLYCOLAX ) 17 g packet Take 17 g by mouth 2 (two) times daily.   Yes [provider]  primidone  (MYSOLINE ) 50 MG tablet Take 50 mg by mouth daily. 01/24/24  Yes [provider]  QUEtiapine  (SEROQUEL ) 200 MG tablet Take 200-500 mg by mouth See admin instructions. Give one tablet by mouth in the morning and 2 and a half tablets at bed time   Yes [provider]  senna (SENOKOT) 8.6 MG TABS tablet Take 2 tablets (17.2 mg total) by mouth  daily. Patient taking differently: Take 2 tablets by mouth in the morning and at bedtime. 09/15/22  Yes Gonfa, Taye T, MD  sertraline  (ZOLOFT ) 100 MG tablet Take 200 mg by mouth daily. 07/17/19  Yes [provider]  spironolactone  (ALDACTONE ) 25 MG tablet Take 25 mg by mouth daily. 04/27/19  Yes [provider]  tiZANidine  (ZANAFLEX ) 2 MG tablet Take 2 mg by mouth every 8 (eight) hours. 09/03/22  Yes [provider]  torsemide  (DEMADEX ) 20 MG tablet Take 1 tablet (20 mg total) by mouth daily. 11/13/20  Yes Cindie Ole DASEN, MD  traMADol  (ULTRAM ) 50 MG tablet Take 50 mg by mouth at bedtime. For pain 03/01/20  Yes [provider]  melatonin 5 MG TABS Take 5 mg by mouth at bedtime.    [provider]  Allergies: Ketamine, Pregabalin, Zolpidem, Doxycycline, Erythromycin, Metoclopramide, Miconazole, Morphine, Penicillins, Pseudoephedrine hcl, Tetracaine, Tetracycline, Tuberculin tests, Clotrimazole, Dopamine, Loratadine, and Mupirocin    Review of Systems  Constitutional:  Negative for fever.  Respiratory:  Negative for wheezing and stridor.   Gastrointestinal:  Positive for abdominal pain. Negative for anorexia.  All other systems reviewed and are negative.   Updated Vital Signs BP (!) 153/48   Pulse 73   Temp 98.1 F (36.7 C) (Oral)   Resp 14   SpO2 96%   Physical Exam Vitals and nursing note reviewed.  Constitutional:      General: She is not in acute distress.    Appearance: Normal appearance. She is well-developed.  HENT:     Head: Normocephalic and atraumatic.     Nose: Nose normal.  Eyes:     Pupils: Pupils are equal, round, and reactive to light.  Cardiovascular:     Rate and Rhythm: Normal rate and regular rhythm.     Pulses: Normal pulses.     Heart sounds: Normal heart sounds.  Pulmonary:     Effort: Pulmonary effort is normal. No respiratory distress.     Breath sounds: Normal breath sounds.  Abdominal:     General: Bowel  sounds are normal. There is no distension.     Palpations: Abdomen is soft.     Tenderness: There is no abdominal tenderness. There is no guarding or rebound.     Hernia: A hernia is present.     Comments: Large L flank hernia   Musculoskeletal:        General: Normal range of motion.     Cervical back: Neck supple.  Skin:    General: Skin is warm and dry.     Capillary Refill: Capillary refill takes less than 2 seconds.     Findings: No erythema or rash.  Neurological:     General: No focal deficit present.     Mental Status: She is alert and oriented to person, place, and time.     Deep Tendon Reflexes: Reflexes normal.  Psychiatric:        Mood and Affect: Mood normal.     (all labs ordered are listed, but only abnormal results are displayed) Results for orders placed or performed during the hospital encounter of 01/28/24  CBC with Differential   Collection Time: 01/28/24 12:08 AM  Result Value Ref Range   WBC 7.0 4.0 - 10.5 K/uL   RBC 4.26 3.87 - 5.11 MIL/uL   Hemoglobin 11.5 (L) 12.0 - 15.0 g/dL   HCT 64.1 (L) 63.9 - 53.9 %   MCV 84.0 80.0 - 100.0 fL   MCH 27.0 26.0 - 34.0 pg   MCHC 32.1 30.0 - 36.0 g/dL   RDW 84.8 88.4 - 84.4 %   Platelets 204 150 - 400 K/uL   nRBC 0.0 0.0 - 0.2 %   Neutrophils Relative % 69 %   Neutro Abs 4.9 1.7 - 7.7 K/uL   Lymphocytes Relative 18 %   Lymphs Abs 1.3 0.7 - 4.0 K/uL   Monocytes Relative 9 %   Monocytes Absolute 0.6 0.1 - 1.0 K/uL   Eosinophils Relative 2 %   Eosinophils Absolute 0.1 0.0 - 0.5 K/uL   Basophils Relative 1 %   Basophils Absolute 0.0 0.0 - 0.1 K/uL   Immature Granulocytes 1 %   Abs Immature Granulocytes 0.04 0.00 - 0.07 K/uL  Comprehensive metabolic panel   Collection Time: 01/28/24 12:08 AM  Result Value  Ref Range   Sodium 140 135 - 145 mmol/L   Potassium 3.0 (L) 3.5 - 5.1 mmol/L   Chloride 101 98 - 111 mmol/L   CO2 29 22 - 32 mmol/L   Glucose, Bld 64 (L) 70 - 99 mg/dL   BUN 16 8 - 23 mg/dL   Creatinine,  Ser 8.87 (H) 0.44 - 1.00 mg/dL   Calcium  9.4 8.9 - 10.3 mg/dL   Total Protein 6.8 6.5 - 8.1 g/dL   Albumin 3.7 3.5 - 5.0 g/dL   AST 14 (L) 15 - 41 U/L   ALT 8 0 - 44 U/L   Alkaline Phosphatase 143 (H) 38 - 126 U/L   Total Bilirubin 0.3 0.0 - 1.2 mg/dL   GFR, Estimated 54 (L) >60 mL/min   Anion gap 10 5 - 15  Urinalysis, Routine w reflex microscopic -Urine, Clean Catch   Collection Time: 01/28/24 12:12 AM  Result Value Ref Range   Color, Urine YELLOW YELLOW   APPearance HAZY (A) CLEAR   Specific Gravity, Urine 1.006 1.005 - 1.030   pH 6.0 5.0 - 8.0   Glucose, UA 50 (A) NEGATIVE mg/dL   Hgb urine dipstick SMALL (A) NEGATIVE   Bilirubin Urine NEGATIVE NEGATIVE   Ketones, ur NEGATIVE NEGATIVE mg/dL   Protein, ur NEGATIVE NEGATIVE mg/dL   Nitrite NEGATIVE NEGATIVE   Leukocytes,Ua LARGE (A) NEGATIVE   RBC / HPF 0-5 0 - 5 RBC/hpf   WBC, UA >50 0 - 5 WBC/hpf   Bacteria, UA FEW (A) NONE SEEN   Squamous Epithelial / HPF 0-5 0 - 5 /HPF   WBC Clumps PRESENT    Mucus PRESENT   CBG monitoring, ED   Collection Time: 01/28/24 11:24 PM  Result Value Ref Range   Glucose-Capillary 91 70 - 99 mg/dL  CBG monitoring, ED   Collection Time: 01/29/24 12:34 AM  Result Value Ref Range   Glucose-Capillary 65 (L) 70 - 99 mg/dL  CBG monitoring, ED   Collection Time: 01/29/24  1:27 AM  Result Value Ref Range   Glucose-Capillary 116 (H) 70 - 99 mg/dL   CT Renal Stone Study Result Date: 01/29/2024 EXAM: CT ABDOMEN AND PELVIS WITHOUT CONTRAST 01/29/2024 12:01:11 AM TECHNIQUE: CT of the abdomen and pelvis was performed without the administration of intravenous contrast. Multiplanar reformatted images are provided for review. Automated exposure control, iterative reconstruction, and/or weight-based adjustment of the mA/kV was utilized to reduce the radiation dose to as low as reasonably achievable. COMPARISON: 09/10/2022 CLINICAL HISTORY: Flank pain and low blood sugar. FINDINGS: LOWER CHEST: No acute  abnormality. LIVER: The liver is unremarkable. GALLBLADDER AND BILE DUCTS: The gallbladder has been surgically removed. No biliary ductal dilatation. SPLEEN: No acute abnormality. PANCREAS: No acute abnormality. ADRENAL GLANDS: No acute abnormality. KIDNEYS, URETERS AND BLADDER: No renal calculi or obstructive changes are noted. The bladder is decompressed by a foley catheter. No perinephric or periureteral stranding. GI AND BOWEL: Postsurgical changes in the stomach are seen. A large ventral hernia is again noted, containing multiple loops of colon and small bowel without evidence of incarceration. The overall appearance is similar to that seen on the prior exam. Changes of prior hernia repair are seen. The appendix is within normal limits. There is no bowel obstruction. PERITONEUM AND RETROPERITONEUM: No ascites. No free air. VASCULATURE: Aorta is normal in caliber. LYMPH NODES: No lymphadenopathy. REPRODUCTIVE ORGANS: The uterus has been surgically removed. BONES AND SOFT TISSUES: No acute osseous abnormality. No focal soft tissue abnormality. IMPRESSION: 1.  No acute findings in the abdomen or pelvis. 2. Large ventral hernia containing multiple loops of colon and small bowel without evidence of incarceration, similar to prior exam. Electronically signed by: Oneil Devonshire MD 01/29/2024 12:08 AM EST RP Workstation: [id number]    None  Radiology: CT Renal Stone Study Result Date: 01/29/2024 EXAM: CT ABDOMEN AND PELVIS WITHOUT CONTRAST 01/29/2024 12:01:11 AM TECHNIQUE: CT of the abdomen and pelvis was performed without the administration of intravenous contrast. Multiplanar reformatted images are provided for review. Automated exposure control, iterative reconstruction, and/or weight-based adjustment of the mA/kV was utilized to reduce the radiation dose to as low as reasonably achievable. COMPARISON: 09/10/2022 CLINICAL HISTORY: Flank pain and low blood sugar. FINDINGS: LOWER CHEST: No acute abnormality. LIVER:  The liver is unremarkable. GALLBLADDER AND BILE DUCTS: The gallbladder has been surgically removed. No biliary ductal dilatation. SPLEEN: No acute abnormality. PANCREAS: No acute abnormality. ADRENAL GLANDS: No acute abnormality. KIDNEYS, URETERS AND BLADDER: No renal calculi or obstructive changes are noted. The bladder is decompressed by a foley catheter. No perinephric or periureteral stranding. GI AND BOWEL: Postsurgical changes in the stomach are seen. A large ventral hernia is again noted, containing multiple loops of colon and small bowel without evidence of incarceration. The overall appearance is similar to that seen on the prior exam. Changes of prior hernia repair are seen. The appendix is within normal limits. There is no bowel obstruction. PERITONEUM AND RETROPERITONEUM: No ascites. No free air. VASCULATURE: Aorta is normal in caliber. LYMPH NODES: No lymphadenopathy. REPRODUCTIVE ORGANS: The uterus has been surgically removed. BONES AND SOFT TISSUES: No acute osseous abnormality. No focal soft tissue abnormality. IMPRESSION: 1. No acute findings in the abdomen or pelvis. 2. Large ventral hernia containing multiple loops of colon and small bowel without evidence of incarceration, similar to prior exam. Electronically signed by: Oneil Devonshire MD 01/29/2024 12:08 AM EST RP Workstation: [id number]     Procedures   Medications Ordered in the ED  dextrose  5 %-0.9 % sodium chloride  infusion ( Intravenous New Bag/Given 01/29/24 0042)  magnesium  sulfate IVPB 2 g 50 mL (2 g Intravenous New Bag/Given 01/29/24 0455)  aspirin  EC tablet 81 mg (has no administration in time range)  traMADol  (ULTRAM ) tablet 50 mg (has no administration in time range)  atorvastatin  (LIPITOR) tablet 40 mg (has no administration in time range)  diltiazem  (CARDIZEM  CD) 24 hr capsule 240 mg (has no administration in time range)  hydrALAZINE  (APRESOLINE ) tablet 10 mg (has no administration in time range)  spironolactone   (ALDACTONE ) tablet 25 mg (has no administration in time range)  torsemide  (DEMADEX ) tablet 20 mg (has no administration in time range)  ALPRAZolam  (XANAX ) tablet 0.5 mg (has no administration in time range)  ARIPiprazole  (ABILIFY ) tablet 2 mg (has no administration in time range)  ARIPiprazole  (ABILIFY ) tablet 5 mg (has no administration in time range)  doxepin  (SINEQUAN ) capsule 10 mg (has no administration in time range)  QUEtiapine  (SEROQUEL ) tablet 200 mg (has no administration in time range)  sertraline  (ZOLOFT ) tablet 200 mg (has no administration in time range)  linaclotide  (LINZESS ) capsule 290 mcg (has no administration in time range)  magnesium  oxide (MAG-OX) tablet 400 mg (has no administration in time range)  polyethylene glycol (MIRALAX  / GLYCOLAX ) packet 17 g (has no administration in time range)  senna (SENOKOT) tablet 17.2 mg (has no administration in time range)  gabapentin  (NEURONTIN ) capsule 600 mg (has no administration in time range)  primidone  (MYSOLINE ) tablet 50 mg (has no administration in  time range)  tiZANidine  (ZANAFLEX ) tablet 2 mg (has no administration in time range)  ascorbic acid  (VITAMIN C ) tablet 1,000 mg (has no administration in time range)  cholecalciferol  (VITAMIN D3) 25 MCG (1000 UNIT) tablet 2,000 Units (has no administration in time range)  albuterol  (PROVENTIL ) (2.5 MG/3ML) 0.083% nebulizer solution 3 mL (has no administration in time range)  fluticasone  furoate-vilanterol (BREO ELLIPTA ) 200-25 MCG/ACT 1 puff (has no administration in time range)  diclofenac  Sodium (VOLTAREN ) 1 % topical gel 2 g (has no administration in time range)  lidocaine  (LIDODERM ) 5 % 1 patch (has no administration in time range)  enoxaparin  (LOVENOX ) injection 40 mg (has no administration in time range)  QUEtiapine  (SEROQUEL ) tablet 500 mg (has no administration in time range)  potassium chloride  10 mEq in 100 mL IVPB (0 mEq Intravenous Stopped 01/29/24 0442)  ketorolac   (TORADOL ) 30 MG/ML injection 15 mg (15 mg Intravenous Given 01/29/24 0121)                                    Medical Decision Making Amount and/or Complexity of Data Reviewed External Data Reviewed: notes.    Details: Previous notes reviewed  Labs: ordered.    Details: Normal white count 7, low hemoglobin 11.5, normal platelets.  Low glucose 65. Urine is negative for UTI. Normal sodium 140, low potassium 3, normal creatinine  Radiology: ordered.  Risk Prescription drug management. Decision regarding hospitalization. Risk Details: Repeat hypoglycemia on D5 will admit     Final diagnoses:  Hypokalemia  Hypoglycemia   The patient appears reasonably stabilized for admission considering the current resources, flow, and capabilities available in the ED at this time, and I doubt any other [facility] requiring further screening and/or treatment in the ED prior to admission.  ED Discharge Orders     None          Alyxander Kollmann, MD 01/29/24 9475  "

## 2024-01-30 DIAGNOSIS — K5900 Constipation, unspecified: Secondary | ICD-10-CM | POA: Diagnosis not present

## 2024-01-30 DIAGNOSIS — R109 Unspecified abdominal pain: Secondary | ICD-10-CM

## 2024-01-30 DIAGNOSIS — E162 Hypoglycemia, unspecified: Secondary | ICD-10-CM | POA: Diagnosis not present

## 2024-01-30 LAB — GLUCOSE, CAPILLARY
Glucose-Capillary: 367 mg/dL — ABNORMAL HIGH (ref 70–99)
Glucose-Capillary: 277 mg/dL — ABNORMAL HIGH (ref 70–99)
Glucose-Capillary: 292 mg/dL — ABNORMAL HIGH (ref 70–99)
Glucose-Capillary: 304 mg/dL — ABNORMAL HIGH (ref 70–99)

## 2024-01-30 MED ORDER — INSULIN GLARGINE 100 UNIT/ML ~~LOC~~ SOLN
10.0000 [IU] | Freq: Every day | SUBCUTANEOUS | Status: DC
  Administered 2024-01-30: 10 [IU] via SUBCUTANEOUS
  Filled 2024-01-30 (×2): qty 0.1

## 2024-01-30 MED ORDER — ACETAMINOPHEN 325 MG PO TABS
650.0000 mg | ORAL_TABLET | Freq: Four times a day (QID) | ORAL | Status: DC | PRN
  Administered 2024-01-30: 650 mg via ORAL
  Filled 2024-01-30: qty 2

## 2024-01-30 NOTE — Progress Notes (Signed)
 Patient complained to RN that she had moderate amount of vaginal bleeding.  On review of records, patient had hysterectomy.  RN did not notice any bleeding.  She has a Foley catheter in place.  Will continue to monitor

## 2024-01-30 NOTE — Progress Notes (Addendum)
 [facility]  PROGRESS NOTE  [name] FMW:[id number] DOB: Apr 08, 1955 DOA: 01/28/2024 PCP: Albina GORMAN Dine, MD   Brief HPI:    69 y.o. female with history of diabetes mellitus type 2, chronic respiratory failure with hypoxia on 2 L oxygen  secondary to COPD and sleep apnea, hypertension, hyperlipidemia, bipolar disorder, morbid obesity, chronic pain, IBS and chronic ventral hernia was brought to the ER after patient blood sugar was found to be in the 40s.  Patient is a poor historian and states that she has chronic abdominal pain and has been having poor appetite.  CT abdomen pelvis does not show anything acute. Patient's blood sugar was 64 and was given D50 following which it was again 65 was started on D5. Labs otherwise shows hemoglobin of 11.5 potassium 3 creatinine 1.1. Admitted for hypoglycemia.    Assessment/Plan:    Hypoglycemia, resolved-in the setting of known history of diabetes mellitus type 2 last hemoglobin A1c in our system about a year ago was 7.3.  Patient is on Semglee  insulin  20 units along with semaglutide and Jardiance .  Oral hypoglycemic agents were held.  Started on D5 W.  CBG has improved.  D5W has been discontinued.   Recheck hemoglobin A1c is 8.7, beta-hydroxybutyrate acid 1.33, cortisol 7.2 level.  C-peptide is pending.  Diabetes mellitus type 2-Will start sliding scale insulin  NovoLog .  CBG is now elevated.  Will restart Semglee  at low-dose of 10 units subcu daily.   Chronic abdominal pain with ventral hernia CT scan abdomen does not show anything acute.  Abdominal pain improved after patient had BM.  Patient received suppository of Dulcolax 10 mg x 1 yesterday.    Hypertension on Cardizem , spironolactone , hydralazine , torsemide .   Chronic kidney disease stage III creatinine at around baseline.   History of chronic pancreatitis CT scan abdomen was unremarkable   History of bipolar disorder on Abilify , Seroquel , Zoloft  and Xanax .  Medications verified  on nursing home [facility].   Chronic pain on gabapentin .   Right lower extremity wound dressing done.  Consult wound team.   Sleep apnea noncompliant with CPAP.   Hyperlipidemia on statins.   Chronic respiratory failure with hypoxia on 2 L oxygen  secondary COPD on Breo and as needed albuterol .     DVT prophylaxis: Lovenox   Medications     ALPRAZolam   0.5 mg Oral Daily   ARIPiprazole   2 mg Oral Daily   ARIPiprazole   5 mg Oral Daily   ascorbic acid   1,000 mg Oral Daily   aspirin  EC  81 mg Oral Daily   atorvastatin   40 mg Oral QHS   Chlorhexidine  Gluconate Cloth  6 each Topical Daily   cholecalciferol   2,000 Units Oral Daily   diclofenac  Sodium  2 g Topical QID   diltiazem   240 mg Oral Daily   doxepin   10 mg Oral QHS   enoxaparin  (LOVENOX ) injection  40 mg Subcutaneous Q24H   fluticasone  furoate-vilanterol  1 puff Inhalation Daily   gabapentin   600 mg Oral Q8H   hydrALAZINE   10 mg Oral Q6H   insulin  aspart  0-9 Units Subcutaneous TID WC   lidocaine   1 patch Transdermal Q12H   linaclotide   290 mcg Oral QAC breakfast   magnesium  oxide  400 mg Oral Daily   polyethylene glycol  17 g Oral BID   primidone   50 mg Oral Daily   QUEtiapine   200 mg Oral Daily   QUEtiapine   500 mg Oral QHS   senna  2 tablet Oral Daily  sertraline   200 mg Oral Daily   spironolactone   25 mg Oral Daily   tiZANidine   2 mg Oral Q8H   torsemide   20 mg Oral Daily   traMADol   50 mg Oral QHS     Data Reviewed:   CBG:  Recent Labs  Lab 01/29/24 1400 01/29/24 1643 01/29/24 1958 01/30/24 0728 01/30/24 1128  GLUCAP 294* 386* 305* 367* 277*    SpO2: 96 % O2 Flow Rate (L/min): 2 L/min    Vitals:   01/29/24 1332 01/29/24 1725 01/29/24 1924 01/30/24 0450  BP: (!) (P) 128/41 (!) 142/48 (!) 154/57 (!) 162/55  Pulse: (P) 85 77 72 69  Resp:  16 17 15   Temp: (P) 97.8 F (36.6 C) 97.9 F (36.6 C) (!) 97.4 F (36.3 C) (!) 97.4 F (36.3 C)  TempSrc: (P) Oral Oral Oral Oral  SpO2: (P) 98% 97% 99%  96%  Weight:      Height:          Data Reviewed:  Basic Metabolic Panel: Recent Labs  Lab 01/28/24 0008 01/29/24 0649  NA 140 137  K 3.0* 3.6  CL 101 99  CO2 29 27  GLUCOSE 64* 201*  BUN 16 13  CREATININE 1.12* 1.02*  CALCIUM  9.4 8.9    CBC: Recent Labs  Lab 01/28/24 0008 01/29/24 0649  WBC 7.0 5.7  NEUTROABS 4.9  --   HGB 11.5* 11.3*  HCT 35.8* 35.2*  MCV 84.0 84.8  PLT 204 186    LFT Recent Labs  Lab 01/28/24 0008 01/29/24 0649  AST 14* 14*  ALT 8 6  ALKPHOS 143* 140*  BILITOT 0.3 0.5  PROT 6.8 6.3*  ALBUMIN 3.7 3.4*     Antibiotics: Anti-infectives (From admission, onward)    None        CONSULTS   Code Status: DNR  Family Communication: No family present at bedside     Subjective   Blood glucose is better controlled now.  Had BM yesterday after suppository was given.   Objective    Physical Examination:   General-appears in no acute distress Heart-S1-S2, regular, no murmur auscultated Lungs-clear to auscultation bilaterally, no wheezing or crackles auscultated Abdomen-soft, mild generalized tenderness to palpation Extremities-no edema in the lower extremities Neuro-alert, oriented x3, no focal deficit noted           Neosha Switalski S Zanaya Baize   [facility] If 7PM-7AM, please contact night-coverage at [email or web address], Office  [phone]   01/30/2024, 1:55 PM  LOS: 0 days

## 2024-01-30 NOTE — Plan of Care (Signed)

## 2024-01-30 NOTE — Progress Notes (Signed)
" °   01/30/24 2217  BiPAP/CPAP/SIPAP  Reason BIPAP/CPAP not in use Non-compliant (PATIENT DOES NOT WANT TO WEAR ONE. I EXPLAINED TO HER THAT IF SHE WANTS TO WEAR ONE OR FEELS SHORT OF BREATH TO JUST LET A NURSE KNOW, AND WE CAN GET HER A CPAP.)    "

## 2024-01-31 DIAGNOSIS — J9611 Chronic respiratory failure with hypoxia: Secondary | ICD-10-CM

## 2024-01-31 DIAGNOSIS — F3160 Bipolar disorder, current episode mixed, unspecified: Secondary | ICD-10-CM

## 2024-01-31 DIAGNOSIS — I5022 Chronic systolic (congestive) heart failure: Secondary | ICD-10-CM

## 2024-01-31 DIAGNOSIS — N1831 Chronic kidney disease, stage 3a: Secondary | ICD-10-CM | POA: Diagnosis not present

## 2024-01-31 DIAGNOSIS — R109 Unspecified abdominal pain: Secondary | ICD-10-CM | POA: Diagnosis not present

## 2024-01-31 DIAGNOSIS — E1142 Type 2 diabetes mellitus with diabetic polyneuropathy: Secondary | ICD-10-CM

## 2024-01-31 DIAGNOSIS — G894 Chronic pain syndrome: Secondary | ICD-10-CM

## 2024-01-31 DIAGNOSIS — E162 Hypoglycemia, unspecified: Secondary | ICD-10-CM | POA: Diagnosis not present

## 2024-01-31 DIAGNOSIS — F419 Anxiety disorder, unspecified: Secondary | ICD-10-CM

## 2024-01-31 DIAGNOSIS — K861 Other chronic pancreatitis: Secondary | ICD-10-CM

## 2024-01-31 DIAGNOSIS — I10 Essential (primary) hypertension: Secondary | ICD-10-CM

## 2024-01-31 DIAGNOSIS — J449 Chronic obstructive pulmonary disease, unspecified: Secondary | ICD-10-CM

## 2024-01-31 LAB — GLUCOSE, CAPILLARY
Glucose-Capillary: 178 mg/dL — ABNORMAL HIGH (ref 70–99)
Glucose-Capillary: 267 mg/dL — ABNORMAL HIGH (ref 70–99)
Glucose-Capillary: 371 mg/dL — ABNORMAL HIGH (ref 70–99)

## 2024-01-31 LAB — C-PEPTIDE: C-Peptide: 1.8 ng/mL (ref 1.1–4.4)

## 2024-01-31 LAB — MISC LABCORP TEST (SEND OUT)
LabCorp test name: 83935
Labcorp test code: 83935

## 2024-01-31 MED ORDER — INSULIN GLARGINE 100 UNIT/ML ~~LOC~~ SOLN
15.0000 [IU] | Freq: Every day | SUBCUTANEOUS | Status: DC
  Administered 2024-01-31: 15 [IU] via SUBCUTANEOUS
  Filled 2024-01-31: qty 0.15

## 2024-01-31 NOTE — Progress Notes (Signed)
 Report called to [facility]. Pt transferred via [facility].

## 2024-01-31 NOTE — Plan of Care (Signed)

## 2024-01-31 NOTE — TOC Transition Note (Addendum)
 Transition of Care [facility]) - Discharge Note   Patient Details  Name: Faith Flores MRN: [id number] Date of Birth: 1955-08-26  Transition of Care [facility]) CM/SW Contact:  Tawni CHRISTELLA Eva, LCSW Phone Number: 01/31/2024, 10:12 AM   Clinical Narrative:     CSW spoke with Tammy in Admissions at [facility]. The pt is a long-term care (LTC) resident at [facility]. Tammy reports the pt can return today; no FL2 is needed only a discharge summary. CSW attempted to call the pts sister to provide an update regarding the pts discharge; no answer, voicemail left.  10:40am CSW received a call back from pt's sister and was informed about pt's d/c. She agrees with plan. RN to call report to [phone]. PTAR called no additional need, ICM sign off.     Final next level of care: Skilled Nursing Facility Barriers to Discharge: Barriers Resolved   Patient Goals and CMS Choice Patient states their goals for this hospitalization and ongoing recovery are:: return to          Discharge Placement                    Patient and family notified of of transfer: 01/31/24  Discharge Plan and Services Additional resources added to the After Visit Summary for                                       Social Drivers of Health (SDOH) Interventions SDOH Screenings   Food Insecurity: No Food Insecurity (01/29/2024)  Housing: Unknown (01/29/2024)  Transportation Needs: No Transportation Needs (01/29/2024)  Utilities: Not At Risk (01/29/2024)  Social Connections: Patient Declined (01/29/2024)  Tobacco Use: Medium Risk (01/29/2024)     Readmission Risk Interventions    09/13/2022    1:27 PM  Readmission Risk Prevention Plan  Transportation Screening Complete  PCP or Specialist Appt within 3-5 Days Complete  HRI or Home Care Consult Complete  Social Work Consult for Recovery Care Planning/Counseling Not Complete  SW consult not completed comments No TOC needs at this time

## 2024-01-31 NOTE — Discharge Summary (Signed)
 " Physician Discharge Summary   Patient: Faith Flores MRN: [id number] DOB: Aug 03, 1955  Admit date:     01/28/2024  Discharge date: 01/31/24  Discharge Physician: Amaryllis Dare   PCP: Albina GORMAN Dine, MD   Recommendations at discharge:  Please obtain CBC and BMP and follow-up Patient might need adjustment in her diabetes medications Please make sure patient to eat regular meals if she is taking insulin . Follow-up with primary care provider  Discharge Diagnoses: Principal Problem:   Hypoglycemia Active Problems:   BIPOLAR I, MIXED, MOST RECENT EPSD NOS   HYPERTENSION, BENIGN ESSENTIAL   Anxiety   Chronic pain syndrome   Chronic wound infection of abdomen   Diabetic polyneuropathy associated with type 2 diabetes mellitus (HCC)   Idiopathic chronic pancreatitis (HCC)   Chronic systolic heart failure (HCC)   OSA on CPAP   Morbid obesity (HCC)   Pressure injury of skin   Chronic respiratory failure with hypoxia (HCC)   COPD (chronic obstructive pulmonary disease) (HCC)   Abdominal pain   CKD (chronic kidney disease) stage 3, GFR 30-59 ml/min (HCC)   Hyperlipidemia   Hospital Course: 69 y.o. female with history of diabetes mellitus type 2, chronic respiratory failure with hypoxia on 2 L oxygen  secondary to COPD and sleep apnea, hypertension, hyperlipidemia, bipolar disorder, morbid obesity, chronic pain, IBS and chronic ventral hernia was brought to the ER after patient blood sugar was found to be in the 40s.  Patient is a poor historian and states that she has chronic abdominal pain and has been having poor appetite.  CT abdomen pelvis does not show anything acute. Patient's blood sugar was 64 and was given D50 following which it was again 65 was started on D5.  She was admitted for hypoglycemia which was resolved later on, blood glucose level started trending up so she was restarted on basal and short acting insulin .  A1c was elevated at 8.7.  Patient will get benefit with a  close PCP follow-up for better control of her diabetes.  Patient with chronic abdominal pain with ventral hernia, CT abdomen was negative for any acute abnormality.  Patient should avoid constipation and can use bowel regimen as needed.  Wound care was provided for her chronic lower extremity wounds and should continue at facility.  Patient remained stable on baseline oxygen  use of 2 L and there was no concern of COPD exacerbation during current hospitalization.  She will continue the rest of her home medications and follow-up with her providers for further assistance.  Consultants: None Procedures performed: None Disposition: Skilled nursing facility Diet recommendation:  Carb modified diet DISCHARGE MEDICATION: Allergies as of 01/31/2024       Reactions   Ketamine Other (See Comments)   Hallucinations   Pregabalin Swelling      Zolpidem Other (See Comments)   Out of sorts/not in control   Doxycycline Other (See Comments)   Reaction unknown; listed on MAR   Erythromycin Other (See Comments)   Reaction unknown   Metoclopramide Other (See Comments)   Tardive diskinesia   Miconazole Other (See Comments)   Reaction unknown   Morphine Other (See Comments)   Reaction unknown; listed on MAR   Penicillins Other (See Comments)   Reaction unknown; listed on MAR   Pseudoephedrine Hcl Other (See Comments)   Reaction unknown   Tetracaine Other (See Comments)   Reaction unknown   Tetracycline Other (See Comments)   Reaction unknown   Tuberculin Tests Other (See Comments)   Reaction  unknown; listed on MAR   Clotrimazole Rash   Dopamine Other (See Comments)   Reaction unknown   Loratadine Other (See Comments)   Urinary retention   Mupirocin Swelling        Medication List     TAKE these medications    acetaminophen  325 MG tablet Commonly known as: TYLENOL  Take 650 mg by mouth every 4 (four) hours as needed for mild pain (pain score 1-3).   albuterol  108 (90 Base)  MCG/ACT inhaler Commonly known as: VENTOLIN  HFA Inhale 2 puffs into the lungs every 6 (six) hours as needed for wheezing or shortness of breath.   ALPRAZolam  0.5 MG tablet Commonly known as: XANAX  Take 0.5 mg by mouth daily.   ARIPiprazole  5 MG tablet Commonly known as: ABILIFY  Take 5 mg by mouth daily. Give with the 2mg  for a total of 7mg    ARIPiprazole  2 MG tablet Commonly known as: ABILIFY  Take 2 mg by mouth daily. Give with 5mg  for a total of 7mg    ascorbic acid  1000 MG tablet Commonly known as: VITAMIN C  Take 1,000 mg by mouth daily.   aspirin  EC 81 MG tablet Take 81 mg by mouth daily. Swallow whole.   atorvastatin  40 MG tablet Commonly known as: LIPITOR Take 40 mg by mouth daily.   Biofreeze Cool The Pain 4 % Gel Generic drug: Menthol (Topical Analgesic) Apply 1 Application topically every 6 (six) hours as needed (Pain).   bisacodyl  10 MG suppository Commonly known as: DULCOLAX Place 1 suppository (10 mg total) rectally daily as needed for moderate constipation.   Butalbital-APAP-Caffeine 50-300-40 MG Caps Take 1 capsule by mouth every 6 (six) hours as needed (Migranes).   diclofenac  Sodium 1 % Gel Commonly known as: VOLTAREN  Apply 2 g topically in the morning and at bedtime. Apply to right shoulder   diltiazem  240 MG 24 hr capsule Commonly known as: CARDIZEM  CD Take 1 capsule (240 mg total) by mouth daily.   doxepin  10 MG capsule Commonly known as: SINEQUAN  Take 10 mg by mouth at bedtime.   Dulera  200-5 MCG/ACT Aero Generic drug: mometasone -formoterol  Inhale 2 puffs into the lungs in the morning and at bedtime.   empagliflozin  25 MG Tabs tablet Commonly known as: JARDIANCE  Take 25 mg by mouth in the morning.   Fleet Liquid Glycerin Supp 5.4 GM/DOSE Enem Generic drug: Glycerin (Laxative) Place 1 enema rectally daily. As needed for constipation   gabapentin  600 MG tablet Commonly known as: NEURONTIN  Take 600 mg by mouth every 8 (eight) hours.    GlucaGen HypoKit 1 MG Solr Generic drug: Glucagon HCl Inject 1 mg into the muscle as needed (Hypoglycemia).   hydrALAZINE  10 MG tablet Commonly known as: APRESOLINE  Take 10 mg by mouth every 6 (six) hours.   insulin  glargine-yfgn 100 UNIT/ML Pen Commonly known as: SEMGLEE  Inject 20 Units into the skin daily. What changed: how much to take   Insulin  Regular Human 100 UNIT/ML KwikPen Commonly known as: HUMULIN R  Inject 0-26 Units into the skin See admin instructions. Inject 5 units subcutaneously with meals for Diabetes in addition to SS . Inject as per sliding scale: Below 70 = Call MD BG 0-150 = 0 units BG 151-200 = 6 units BG 201-250 = 10 units BG 251-300 = 14 units BG 301-350 = 18 units BG 351-400 = 22 units BG 401+ = 26 units Hold if BS is less than 200   LidaFlex 4 % Ptch Generic drug: Lidocaine  HCl Apply 1 patch topically every 12 (  twelve) hours. Remove after 12hrs Apply 2000, Remove 0800   Linzess  290 MCG Caps capsule Generic drug: linaclotide  Take 290 mcg by mouth See admin instructions. Give one capsule by mouth one time a day every other day for constipation   magnesium  oxide 400 MG tablet Commonly known as: MAG-OX Take 400 mg by mouth daily.   melatonin 5 MG Tabs Take 5 mg by mouth at bedtime.   nystatin powder Apply 1 Application topically 3 (three) times daily.   OXYGEN  Inhale 2 L into the lungs daily.   Ozempic (0.25 or 0.5 MG/DOSE) 2 MG/3ML Sopn Generic drug: Semaglutide(0.25 or 0.5MG /DOS)   polyethylene glycol 17 g packet Commonly known as: MIRALAX  / GLYCOLAX  Take 17 g by mouth 2 (two) times daily.   primidone  50 MG tablet Commonly known as: MYSOLINE  Take 50 mg by mouth daily.   QUEtiapine  200 MG tablet Commonly known as: SEROQUEL  Take 200-500 mg by mouth See admin instructions. Give one tablet by mouth in the morning and 2 and a half tablets at bed time   senna 8.6 MG Tabs tablet Commonly known as: SENOKOT Take 2 tablets (17.2 mg  total) by mouth daily. What changed: when to take this   sertraline  100 MG tablet Commonly known as: ZOLOFT  Take 200 mg by mouth daily.   spironolactone  25 MG tablet Commonly known as: ALDACTONE  Take 25 mg by mouth daily.   tiZANidine  2 MG tablet Commonly known as: ZANAFLEX  Take 2 mg by mouth every 8 (eight) hours.   torsemide  20 MG tablet Commonly known as: Demadex  Take 1 tablet (20 mg total) by mouth daily.   traMADol  50 MG tablet Commonly known as: ULTRAM  Take 50 mg by mouth at bedtime. For pain   Vitamin D3 50 MCG (2000 UT) Tabs Take 2,000 Units by mouth daily.               Discharge Care Instructions  (From admission, onward)           Start     Ordered   01/31/24 0000  Discharge wound care:       Comments: Cleanse bilateral lower leg wound with NS and pat dry. Apply aquacel to open wound on right leg.  Unna boot to bilateral lower legs. Change twice weekly.   01/31/24 1038            Follow-up Information     Albina GORMAN Dine, MD. Schedule an appointment as soon as possible for a visit in 1 week(s).   Specialty: Internal Medicine Contact information: [address] [place] [address] [phone]                Discharge Exam: Fredricka Weights   01/29/24 0529  Weight: 128.9 kg   General.  Morbidly obese lady, in no acute distress. Pulmonary.  Lungs clear bilaterally, normal respiratory effort. CV.  Regular rate and rhythm, no JVD, rub or murmur. Abdomen.  Soft, nontender, nondistended, BS positive. CNS.  Alert and oriented .  No focal neurologic deficit. Extremities.  Bilateral wrapped lower extremities Psychiatry.  Judgment and insight appears normal.   Condition at discharge: stable  The results of significant diagnostics from this hospitalization (including imaging, microbiology, ancillary and laboratory) are listed below for reference.   Imaging Studies: CT Renal Stone Study Result Date: 01/29/2024 EXAM: CT ABDOMEN  AND PELVIS WITHOUT CONTRAST 01/29/2024 12:01:11 AM TECHNIQUE: CT of the abdomen and pelvis was performed without the administration of intravenous contrast. Multiplanar reformatted images are  provided for review. Automated exposure control, iterative reconstruction, and/or weight-based adjustment of the mA/kV was utilized to reduce the radiation dose to as low as reasonably achievable. COMPARISON: 09/10/2022 CLINICAL HISTORY: Flank pain and low blood sugar. FINDINGS: LOWER CHEST: No acute abnormality. LIVER: The liver is unremarkable. GALLBLADDER AND BILE DUCTS: The gallbladder has been surgically removed. No biliary ductal dilatation. SPLEEN: No acute abnormality. PANCREAS: No acute abnormality. ADRENAL GLANDS: No acute abnormality. KIDNEYS, URETERS AND BLADDER: No renal calculi or obstructive changes are noted. The bladder is decompressed by a foley catheter. No perinephric or periureteral stranding. GI AND BOWEL: Postsurgical changes in the stomach are seen. A large ventral hernia is again noted, containing multiple loops of colon and small bowel without evidence of incarceration. The overall appearance is similar to that seen on the prior exam. Changes of prior hernia repair are seen. The appendix is within normal limits. There is no bowel obstruction. PERITONEUM AND RETROPERITONEUM: No ascites. No free air. VASCULATURE: Aorta is normal in caliber. LYMPH NODES: No lymphadenopathy. REPRODUCTIVE ORGANS: The uterus has been surgically removed. BONES AND SOFT TISSUES: No acute osseous abnormality. No focal soft tissue abnormality. IMPRESSION: 1. No acute findings in the abdomen or pelvis. 2. Large ventral hernia containing multiple loops of colon and small bowel without evidence of incarceration, similar to prior exam. Electronically signed by: Oneil Devonshire MD 01/29/2024 12:08 AM EST RP Workstation: [id number]    Microbiology: Results for orders placed or performed during the hospital encounter of 05/08/20   Urine culture     Status: Abnormal   Collection Time: 05/08/20  9:55 PM   Specimen: Urine, Random  Result Value Ref Range Status   Specimen Description URINE, RANDOM  Final   Special Requests   Final    NONE Performed at [facility], [address] [place], [place] [address]    Culture (A)  Final    >=100,000 COLONIES/mL KLEBSIELLA PNEUMONIAE >=100,000 COLONIES/mL ENTEROCOCCUS FAECALIS    Report Status 05/11/2020 FINAL  Final   Organism ID, Bacteria KLEBSIELLA PNEUMONIAE (A)  Final   Organism ID, Bacteria ENTEROCOCCUS FAECALIS (A)  Final      Susceptibility   Enterococcus faecalis - MIC*    AMPICILLIN <=2 SENSITIVE Sensitive     NITROFURANTOIN <=16 SENSITIVE Sensitive     VANCOMYCIN 1 SENSITIVE Sensitive     * >=100,000 COLONIES/mL ENTEROCOCCUS FAECALIS   Klebsiella pneumoniae - MIC*    AMPICILLIN RESISTANT Resistant     CEFAZOLIN <=4 SENSITIVE Sensitive     CEFEPIME <=0.12 SENSITIVE Sensitive     CEFTRIAXONE  <=0.25 SENSITIVE Sensitive     CIPROFLOXACIN <=0.25 SENSITIVE Sensitive     GENTAMICIN <=1 SENSITIVE Sensitive     IMIPENEM 0.5 SENSITIVE Sensitive     NITROFURANTOIN <=16 SENSITIVE Sensitive     TRIMETH /SULFA  <=20 SENSITIVE Sensitive     AMPICILLIN/SULBACTAM 4 SENSITIVE Sensitive     PIP/TAZO <=4 SENSITIVE Sensitive     * >=100,000 COLONIES/mL KLEBSIELLA PNEUMONIAE    Labs: CBC: Recent Labs  Lab 01/28/24 0008 01/29/24 0649  WBC 7.0 5.7  NEUTROABS 4.9  --   HGB 11.5* 11.3*  HCT 35.8* 35.2*  MCV 84.0 84.8  PLT 204 186   Basic Metabolic Panel: Recent Labs  Lab 01/28/24 0008 01/29/24 0649  NA 140 137  K 3.0* 3.6  CL 101 99  CO2 29 27  GLUCOSE 64* 201*  BUN 16 13  CREATININE 1.12* 1.02*  CALCIUM  9.4 8.9   Liver Function Tests: Recent  Labs  Lab 01/28/24 0008 01/29/24 0649  AST 14* 14*  ALT 8 6  ALKPHOS 143* 140*  BILITOT 0.3 0.5  PROT 6.8 6.3*  ALBUMIN 3.7 3.4*   CBG: Recent Labs  Lab 01/30/24 0728 01/30/24 1128 01/30/24 1630  01/30/24 2001 01/31/24 0750  GLUCAP 367* 277* 292* 304* 267*    Discharge time spent: greater than 30 minutes.  This record has been created using Conservation officer, historic buildings. Errors have been sought and corrected,but may not always be located. Such creation errors do not reflect on the standard of care.   Signed: Amaryllis Dare, MD [facility] 01/31/2024 "
